# Patient Record
Sex: Female | Born: 1950 | Hispanic: No | Marital: Married | Smoking: Former smoker
Health system: Southern US, Community
[De-identification: ages and names within clinical notes are randomized; demographics above are authoritative.]

## PROBLEM LIST (undated history)

## (undated) DIAGNOSIS — R0602 Shortness of breath: Secondary | ICD-10-CM

## (undated) DIAGNOSIS — I10 Essential (primary) hypertension: Secondary | ICD-10-CM

## (undated) DIAGNOSIS — K219 Gastro-esophageal reflux disease without esophagitis: Secondary | ICD-10-CM

## (undated) DIAGNOSIS — R42 Dizziness and giddiness: Secondary | ICD-10-CM

## (undated) DIAGNOSIS — J449 Chronic obstructive pulmonary disease, unspecified: Secondary | ICD-10-CM

## (undated) DIAGNOSIS — C50919 Malignant neoplasm of unspecified site of unspecified female breast: Secondary | ICD-10-CM

## (undated) DIAGNOSIS — J189 Pneumonia, unspecified organism: Secondary | ICD-10-CM

## (undated) DIAGNOSIS — H5340 Unspecified visual field defects: Secondary | ICD-10-CM

## (undated) DIAGNOSIS — I2510 Atherosclerotic heart disease of native coronary artery without angina pectoris: Secondary | ICD-10-CM

## (undated) HISTORY — PX: TUBAL LIGATION: SHX77

## (undated) HISTORY — DX: Essential (primary) hypertension: I10

## (undated) HISTORY — DX: Chronic obstructive pulmonary disease, unspecified: J44.9

## (undated) HISTORY — DX: Malignant neoplasm of unspecified site of unspecified female breast: C50.919

## (undated) HISTORY — DX: Unspecified visual field defects: H53.40

---

## 1998-03-08 ENCOUNTER — Other Ambulatory Visit: Admission: RE | Admit: 1998-03-08 | Discharge: 1998-03-08 | Payer: Self-pay | Admitting: *Deleted

## 1998-05-08 ENCOUNTER — Encounter: Admission: RE | Admit: 1998-05-08 | Discharge: 1998-05-08 | Payer: Self-pay | Admitting: Family Medicine

## 1998-05-17 ENCOUNTER — Emergency Department (HOSPITAL_COMMUNITY): Admission: EM | Admit: 1998-05-17 | Discharge: 1998-05-17 | Payer: Self-pay | Admitting: *Deleted

## 1998-05-20 ENCOUNTER — Encounter: Admission: RE | Admit: 1998-05-20 | Discharge: 1998-05-20 | Payer: Self-pay | Admitting: Family Medicine

## 1998-05-28 ENCOUNTER — Encounter: Admission: RE | Admit: 1998-05-28 | Discharge: 1998-05-28 | Payer: Self-pay | Admitting: Sports Medicine

## 1998-05-31 ENCOUNTER — Encounter: Admission: RE | Admit: 1998-05-31 | Discharge: 1998-05-31 | Payer: Self-pay | Admitting: Family Medicine

## 1998-06-06 ENCOUNTER — Encounter: Admission: RE | Admit: 1998-06-06 | Discharge: 1998-06-06 | Payer: Self-pay | Admitting: Family Medicine

## 1998-06-13 ENCOUNTER — Encounter: Admission: RE | Admit: 1998-06-13 | Discharge: 1998-06-13 | Payer: Self-pay | Admitting: Family Medicine

## 1998-06-25 ENCOUNTER — Encounter: Admission: RE | Admit: 1998-06-25 | Discharge: 1998-06-25 | Payer: Self-pay | Admitting: Sports Medicine

## 1998-07-29 ENCOUNTER — Encounter: Admission: RE | Admit: 1998-07-29 | Discharge: 1998-07-29 | Payer: Self-pay | Admitting: Family Medicine

## 1998-08-12 ENCOUNTER — Encounter: Admission: RE | Admit: 1998-08-12 | Discharge: 1998-08-12 | Payer: Self-pay | Admitting: Family Medicine

## 1998-10-03 ENCOUNTER — Encounter: Admission: RE | Admit: 1998-10-03 | Discharge: 1998-10-03 | Payer: Self-pay | Admitting: Family Medicine

## 1998-10-08 ENCOUNTER — Encounter: Admission: RE | Admit: 1998-10-08 | Discharge: 1998-10-08 | Payer: Self-pay | Admitting: Sports Medicine

## 1998-10-17 ENCOUNTER — Encounter: Admission: RE | Admit: 1998-10-17 | Discharge: 1998-10-17 | Payer: Self-pay | Admitting: Family Medicine

## 1998-11-07 ENCOUNTER — Encounter: Admission: RE | Admit: 1998-11-07 | Discharge: 1998-11-07 | Payer: Self-pay | Admitting: Family Medicine

## 1998-11-13 ENCOUNTER — Ambulatory Visit (HOSPITAL_BASED_OUTPATIENT_CLINIC_OR_DEPARTMENT_OTHER): Admission: RE | Admit: 1998-11-13 | Discharge: 1998-11-13 | Payer: Self-pay | Admitting: Orthopedic Surgery

## 1998-11-18 ENCOUNTER — Encounter: Admission: RE | Admit: 1998-11-18 | Discharge: 1998-11-18 | Payer: Self-pay | Admitting: Sports Medicine

## 1998-12-19 ENCOUNTER — Emergency Department (HOSPITAL_COMMUNITY): Admission: EM | Admit: 1998-12-19 | Discharge: 1998-12-19 | Payer: Self-pay | Admitting: *Deleted

## 1998-12-24 ENCOUNTER — Encounter: Admission: RE | Admit: 1998-12-24 | Discharge: 1998-12-24 | Payer: Self-pay | Admitting: Family Medicine

## 1999-01-07 ENCOUNTER — Encounter: Admission: RE | Admit: 1999-01-07 | Discharge: 1999-01-07 | Payer: Self-pay | Admitting: Family Medicine

## 1999-01-13 ENCOUNTER — Encounter: Admission: RE | Admit: 1999-01-13 | Discharge: 1999-01-13 | Payer: Self-pay | Admitting: Family Medicine

## 1999-01-22 ENCOUNTER — Encounter: Admission: RE | Admit: 1999-01-22 | Discharge: 1999-01-22 | Payer: Self-pay | Admitting: Family Medicine

## 1999-01-29 ENCOUNTER — Encounter: Admission: RE | Admit: 1999-01-29 | Discharge: 1999-01-29 | Payer: Self-pay | Admitting: Family Medicine

## 1999-02-03 ENCOUNTER — Encounter: Admission: RE | Admit: 1999-02-03 | Discharge: 1999-02-03 | Payer: Self-pay | Admitting: Family Medicine

## 1999-02-13 ENCOUNTER — Encounter: Admission: RE | Admit: 1999-02-13 | Discharge: 1999-02-13 | Payer: Self-pay | Admitting: Family Medicine

## 1999-03-24 ENCOUNTER — Encounter: Admission: RE | Admit: 1999-03-24 | Discharge: 1999-03-24 | Payer: Self-pay | Admitting: Family Medicine

## 1999-04-10 ENCOUNTER — Other Ambulatory Visit: Admission: RE | Admit: 1999-04-10 | Discharge: 1999-04-10 | Payer: Self-pay | Admitting: *Deleted

## 1999-05-08 ENCOUNTER — Ambulatory Visit (HOSPITAL_COMMUNITY): Admission: RE | Admit: 1999-05-08 | Discharge: 1999-05-08 | Payer: Self-pay | Admitting: Family Medicine

## 1999-05-12 ENCOUNTER — Encounter: Admission: RE | Admit: 1999-05-12 | Discharge: 1999-05-12 | Payer: Self-pay | Admitting: Family Medicine

## 1999-06-12 ENCOUNTER — Encounter: Admission: RE | Admit: 1999-06-12 | Discharge: 1999-06-12 | Payer: Self-pay | Admitting: Family Medicine

## 1999-06-17 ENCOUNTER — Encounter: Admission: RE | Admit: 1999-06-17 | Discharge: 1999-06-17 | Payer: Self-pay | Admitting: *Deleted

## 1999-06-18 ENCOUNTER — Encounter: Admission: RE | Admit: 1999-06-18 | Discharge: 1999-06-18 | Payer: Self-pay | Admitting: Family Medicine

## 1999-07-21 ENCOUNTER — Encounter: Admission: RE | Admit: 1999-07-21 | Discharge: 1999-07-21 | Payer: Self-pay | Admitting: Family Medicine

## 1999-07-22 ENCOUNTER — Encounter: Admission: RE | Admit: 1999-07-22 | Discharge: 1999-07-22 | Payer: Self-pay | Admitting: *Deleted

## 1999-07-29 ENCOUNTER — Encounter: Admission: RE | Admit: 1999-07-29 | Discharge: 1999-07-29 | Payer: Self-pay | Admitting: Sports Medicine

## 1999-07-29 ENCOUNTER — Encounter: Payer: Self-pay | Admitting: Emergency Medicine

## 1999-07-29 ENCOUNTER — Inpatient Hospital Stay (HOSPITAL_COMMUNITY): Admission: EM | Admit: 1999-07-29 | Discharge: 1999-07-30 | Payer: Self-pay | Admitting: Emergency Medicine

## 1999-08-05 ENCOUNTER — Encounter: Admission: RE | Admit: 1999-08-05 | Discharge: 1999-08-05 | Payer: Self-pay | Admitting: Family Medicine

## 1999-08-11 ENCOUNTER — Ambulatory Visit (HOSPITAL_COMMUNITY): Admission: RE | Admit: 1999-08-11 | Discharge: 1999-08-11 | Payer: Self-pay | Admitting: *Deleted

## 1999-08-20 ENCOUNTER — Encounter: Admission: RE | Admit: 1999-08-20 | Discharge: 1999-08-20 | Payer: Self-pay | Admitting: Family Medicine

## 1999-09-23 ENCOUNTER — Other Ambulatory Visit: Admission: RE | Admit: 1999-09-23 | Discharge: 1999-09-23 | Payer: Self-pay | Admitting: Obstetrics & Gynecology

## 1999-09-23 ENCOUNTER — Encounter: Admission: RE | Admit: 1999-09-23 | Discharge: 1999-09-23 | Payer: Self-pay | Admitting: Obstetrics & Gynecology

## 1999-09-28 ENCOUNTER — Emergency Department (HOSPITAL_COMMUNITY): Admission: EM | Admit: 1999-09-28 | Discharge: 1999-09-28 | Payer: Self-pay | Admitting: Emergency Medicine

## 1999-09-29 ENCOUNTER — Encounter: Admission: RE | Admit: 1999-09-29 | Discharge: 1999-09-29 | Payer: Self-pay | Admitting: Family Medicine

## 1999-09-29 ENCOUNTER — Encounter: Payer: Self-pay | Admitting: Emergency Medicine

## 1999-10-07 ENCOUNTER — Encounter: Admission: RE | Admit: 1999-10-07 | Discharge: 1999-10-07 | Payer: Self-pay | Admitting: Sports Medicine

## 1999-10-09 ENCOUNTER — Encounter: Admission: RE | Admit: 1999-10-09 | Discharge: 1999-10-09 | Payer: Self-pay | Admitting: Family Medicine

## 1999-10-10 ENCOUNTER — Inpatient Hospital Stay (HOSPITAL_COMMUNITY): Admission: AD | Admit: 1999-10-10 | Discharge: 1999-10-10 | Payer: Self-pay | Admitting: Obstetrics

## 1999-10-10 ENCOUNTER — Encounter: Admission: RE | Admit: 1999-10-10 | Discharge: 1999-10-10 | Payer: Self-pay | Admitting: Obstetrics & Gynecology

## 1999-10-21 ENCOUNTER — Encounter: Admission: RE | Admit: 1999-10-21 | Discharge: 1999-10-21 | Payer: Self-pay | Admitting: Sports Medicine

## 1999-10-23 ENCOUNTER — Encounter: Payer: Self-pay | Admitting: Emergency Medicine

## 1999-10-23 ENCOUNTER — Inpatient Hospital Stay (HOSPITAL_COMMUNITY): Admission: EM | Admit: 1999-10-23 | Discharge: 1999-10-24 | Payer: Self-pay | Admitting: Emergency Medicine

## 1999-10-29 ENCOUNTER — Encounter: Admission: RE | Admit: 1999-10-29 | Discharge: 1999-10-29 | Payer: Self-pay | Admitting: Family Medicine

## 1999-12-19 ENCOUNTER — Encounter: Admission: RE | Admit: 1999-12-19 | Discharge: 1999-12-19 | Payer: Self-pay | Admitting: Family Medicine

## 2000-01-16 ENCOUNTER — Encounter: Admission: RE | Admit: 2000-01-16 | Discharge: 2000-01-16 | Payer: Self-pay | Admitting: Family Medicine

## 2000-01-26 ENCOUNTER — Encounter: Admission: RE | Admit: 2000-01-26 | Discharge: 2000-01-26 | Payer: Self-pay

## 2000-01-31 ENCOUNTER — Encounter: Payer: Self-pay | Admitting: Emergency Medicine

## 2000-01-31 ENCOUNTER — Emergency Department (HOSPITAL_COMMUNITY): Admission: EM | Admit: 2000-01-31 | Discharge: 2000-01-31 | Payer: Self-pay | Admitting: Emergency Medicine

## 2000-02-10 ENCOUNTER — Encounter: Admission: RE | Admit: 2000-02-10 | Discharge: 2000-02-10 | Payer: Self-pay | Admitting: Family Medicine

## 2000-02-24 ENCOUNTER — Encounter: Admission: RE | Admit: 2000-02-24 | Discharge: 2000-02-24 | Payer: Self-pay | Admitting: Family Medicine

## 2000-03-10 ENCOUNTER — Encounter: Admission: RE | Admit: 2000-03-10 | Discharge: 2000-03-10 | Payer: Self-pay | Admitting: Family Medicine

## 2000-04-01 ENCOUNTER — Encounter: Admission: RE | Admit: 2000-04-01 | Discharge: 2000-04-01 | Payer: Self-pay | Admitting: Family Medicine

## 2000-05-14 ENCOUNTER — Encounter: Admission: RE | Admit: 2000-05-14 | Discharge: 2000-05-14 | Payer: Self-pay | Admitting: Family Medicine

## 2000-05-31 ENCOUNTER — Encounter: Admission: RE | Admit: 2000-05-31 | Discharge: 2000-05-31 | Payer: Self-pay | Admitting: Family Medicine

## 2000-06-23 ENCOUNTER — Encounter: Admission: RE | Admit: 2000-06-23 | Discharge: 2000-06-23 | Payer: Self-pay | Admitting: Family Medicine

## 2000-10-19 ENCOUNTER — Encounter: Admission: RE | Admit: 2000-10-19 | Discharge: 2000-10-19 | Payer: Self-pay | Admitting: Sports Medicine

## 2000-10-26 ENCOUNTER — Encounter: Admission: RE | Admit: 2000-10-26 | Discharge: 2001-01-24 | Payer: Self-pay | Admitting: Family Medicine

## 2000-11-02 ENCOUNTER — Encounter: Payer: Self-pay | Admitting: Family Medicine

## 2000-11-02 ENCOUNTER — Encounter: Admission: RE | Admit: 2000-11-02 | Discharge: 2000-11-02 | Payer: Self-pay | Admitting: Family Medicine

## 2000-11-15 ENCOUNTER — Encounter: Admission: RE | Admit: 2000-11-15 | Discharge: 2000-11-15 | Payer: Self-pay | Admitting: Family Medicine

## 2000-12-03 ENCOUNTER — Encounter: Admission: RE | Admit: 2000-12-03 | Discharge: 2000-12-03 | Payer: Self-pay | Admitting: Family Medicine

## 2001-01-14 ENCOUNTER — Encounter: Admission: RE | Admit: 2001-01-14 | Discharge: 2001-01-14 | Payer: Self-pay | Admitting: Family Medicine

## 2001-01-17 ENCOUNTER — Encounter: Admission: RE | Admit: 2001-01-17 | Discharge: 2001-01-17 | Payer: Self-pay | Admitting: Family Medicine

## 2001-02-14 ENCOUNTER — Encounter: Admission: RE | Admit: 2001-02-14 | Discharge: 2001-02-14 | Payer: Self-pay | Admitting: Family Medicine

## 2001-02-15 ENCOUNTER — Encounter: Payer: Self-pay | Admitting: Family Medicine

## 2001-02-15 ENCOUNTER — Encounter: Admission: RE | Admit: 2001-02-15 | Discharge: 2001-02-15 | Payer: Self-pay | Admitting: Family Medicine

## 2001-03-17 ENCOUNTER — Encounter: Admission: RE | Admit: 2001-03-17 | Discharge: 2001-03-17 | Payer: Self-pay | Admitting: Family Medicine

## 2001-04-11 ENCOUNTER — Inpatient Hospital Stay (HOSPITAL_COMMUNITY): Admission: EM | Admit: 2001-04-11 | Discharge: 2001-04-12 | Payer: Self-pay | Admitting: Emergency Medicine

## 2001-04-11 ENCOUNTER — Encounter: Admission: RE | Admit: 2001-04-11 | Discharge: 2001-04-11 | Payer: Self-pay | Admitting: Family Medicine

## 2001-04-11 ENCOUNTER — Encounter: Payer: Self-pay | Admitting: Sports Medicine

## 2001-05-12 ENCOUNTER — Ambulatory Visit (HOSPITAL_COMMUNITY): Admission: RE | Admit: 2001-05-12 | Discharge: 2001-05-12 | Payer: Self-pay | Admitting: Family Medicine

## 2001-05-12 ENCOUNTER — Encounter: Admission: RE | Admit: 2001-05-12 | Discharge: 2001-05-12 | Payer: Self-pay | Admitting: Family Medicine

## 2001-06-10 ENCOUNTER — Encounter: Admission: RE | Admit: 2001-06-10 | Discharge: 2001-06-10 | Payer: Self-pay | Admitting: Family Medicine

## 2001-06-20 ENCOUNTER — Encounter: Admission: RE | Admit: 2001-06-20 | Discharge: 2001-06-20 | Payer: Self-pay | Admitting: Family Medicine

## 2001-06-30 ENCOUNTER — Encounter: Admission: RE | Admit: 2001-06-30 | Discharge: 2001-06-30 | Payer: Self-pay | Admitting: Family Medicine

## 2001-07-26 ENCOUNTER — Encounter: Admission: RE | Admit: 2001-07-26 | Discharge: 2001-07-26 | Payer: Self-pay | Admitting: Family Medicine

## 2001-08-22 ENCOUNTER — Encounter: Admission: RE | Admit: 2001-08-22 | Discharge: 2001-08-22 | Payer: Self-pay | Admitting: Family Medicine

## 2001-08-31 DIAGNOSIS — C50919 Malignant neoplasm of unspecified site of unspecified female breast: Secondary | ICD-10-CM

## 2001-08-31 HISTORY — DX: Malignant neoplasm of unspecified site of unspecified female breast: C50.919

## 2001-09-06 ENCOUNTER — Encounter: Admission: RE | Admit: 2001-09-06 | Discharge: 2001-09-06 | Payer: Self-pay | Admitting: Family Medicine

## 2001-09-09 ENCOUNTER — Encounter: Admission: RE | Admit: 2001-09-09 | Discharge: 2001-09-09 | Payer: Self-pay | Admitting: Family Medicine

## 2001-10-06 ENCOUNTER — Encounter: Admission: RE | Admit: 2001-10-06 | Discharge: 2001-10-06 | Payer: Self-pay | Admitting: Family Medicine

## 2001-10-18 ENCOUNTER — Encounter: Admission: RE | Admit: 2001-10-18 | Discharge: 2001-10-18 | Payer: Self-pay | Admitting: Family Medicine

## 2001-11-08 ENCOUNTER — Encounter: Admission: RE | Admit: 2001-11-08 | Discharge: 2001-11-08 | Payer: Self-pay | Admitting: Sports Medicine

## 2001-11-09 ENCOUNTER — Encounter: Admission: RE | Admit: 2001-11-09 | Discharge: 2001-11-09 | Payer: Self-pay | Admitting: Family Medicine

## 2001-11-09 ENCOUNTER — Encounter: Payer: Self-pay | Admitting: Family Medicine

## 2001-11-15 ENCOUNTER — Encounter: Admission: RE | Admit: 2001-11-15 | Discharge: 2001-11-15 | Payer: Self-pay | Admitting: Family Medicine

## 2001-11-15 ENCOUNTER — Encounter: Payer: Self-pay | Admitting: Family Medicine

## 2001-11-15 ENCOUNTER — Other Ambulatory Visit: Admission: RE | Admit: 2001-11-15 | Discharge: 2001-11-15 | Payer: Self-pay | Admitting: Family Medicine

## 2001-12-06 ENCOUNTER — Encounter: Admission: RE | Admit: 2001-12-06 | Discharge: 2001-12-06 | Payer: Self-pay | Admitting: Family Medicine

## 2001-12-09 ENCOUNTER — Encounter: Admission: RE | Admit: 2001-12-09 | Discharge: 2001-12-09 | Payer: Self-pay | Admitting: Family Medicine

## 2001-12-12 ENCOUNTER — Ambulatory Visit (HOSPITAL_BASED_OUTPATIENT_CLINIC_OR_DEPARTMENT_OTHER): Admission: RE | Admit: 2001-12-12 | Discharge: 2001-12-12 | Payer: Self-pay | Admitting: General Surgery

## 2001-12-12 ENCOUNTER — Encounter: Payer: Self-pay | Admitting: General Surgery

## 2001-12-12 ENCOUNTER — Encounter: Admission: RE | Admit: 2001-12-12 | Discharge: 2001-12-12 | Payer: Self-pay | Admitting: General Surgery

## 2001-12-12 ENCOUNTER — Encounter (INDEPENDENT_AMBULATORY_CARE_PROVIDER_SITE_OTHER): Payer: Self-pay | Admitting: *Deleted

## 2001-12-28 ENCOUNTER — Encounter: Admission: RE | Admit: 2001-12-28 | Discharge: 2001-12-28 | Payer: Self-pay | Admitting: Family Medicine

## 2001-12-30 ENCOUNTER — Encounter: Admission: RE | Admit: 2001-12-30 | Discharge: 2001-12-30 | Payer: Self-pay | Admitting: Family Medicine

## 2002-01-03 ENCOUNTER — Encounter: Admission: RE | Admit: 2002-01-03 | Discharge: 2002-01-03 | Payer: Self-pay | Admitting: Family Medicine

## 2002-01-06 ENCOUNTER — Encounter: Admission: RE | Admit: 2002-01-06 | Discharge: 2002-01-06 | Payer: Self-pay | Admitting: Family Medicine

## 2002-01-06 ENCOUNTER — Ambulatory Visit (HOSPITAL_BASED_OUTPATIENT_CLINIC_OR_DEPARTMENT_OTHER): Admission: RE | Admit: 2002-01-06 | Discharge: 2002-01-06 | Payer: Self-pay | Admitting: General Surgery

## 2002-01-20 ENCOUNTER — Ambulatory Visit (HOSPITAL_BASED_OUTPATIENT_CLINIC_OR_DEPARTMENT_OTHER): Admission: RE | Admit: 2002-01-20 | Discharge: 2002-01-21 | Payer: Self-pay | Admitting: General Surgery

## 2002-01-20 ENCOUNTER — Encounter (INDEPENDENT_AMBULATORY_CARE_PROVIDER_SITE_OTHER): Payer: Self-pay | Admitting: Specialist

## 2002-01-20 ENCOUNTER — Ambulatory Visit (HOSPITAL_COMMUNITY): Admission: RE | Admit: 2002-01-20 | Discharge: 2002-01-20 | Payer: Self-pay | Admitting: Internal Medicine

## 2002-01-30 ENCOUNTER — Ambulatory Visit: Admission: RE | Admit: 2002-01-30 | Discharge: 2002-04-30 | Payer: Self-pay | Admitting: Radiation Oncology

## 2002-02-15 ENCOUNTER — Encounter: Admission: RE | Admit: 2002-02-15 | Discharge: 2002-02-15 | Payer: Self-pay | Admitting: Family Medicine

## 2002-02-17 ENCOUNTER — Encounter: Payer: Self-pay | Admitting: Oncology

## 2002-02-17 ENCOUNTER — Ambulatory Visit (HOSPITAL_COMMUNITY): Admission: RE | Admit: 2002-02-17 | Discharge: 2002-02-17 | Payer: Self-pay | Admitting: Oncology

## 2002-02-21 ENCOUNTER — Ambulatory Visit (HOSPITAL_COMMUNITY): Admission: RE | Admit: 2002-02-21 | Discharge: 2002-02-21 | Payer: Self-pay | Admitting: Family Medicine

## 2002-03-06 ENCOUNTER — Encounter: Payer: Self-pay | Admitting: General Surgery

## 2002-03-06 ENCOUNTER — Ambulatory Visit (HOSPITAL_BASED_OUTPATIENT_CLINIC_OR_DEPARTMENT_OTHER): Admission: RE | Admit: 2002-03-06 | Discharge: 2002-03-06 | Payer: Self-pay | Admitting: General Surgery

## 2002-03-15 ENCOUNTER — Encounter: Admission: RE | Admit: 2002-03-15 | Discharge: 2002-03-15 | Payer: Self-pay | Admitting: Family Medicine

## 2002-03-24 ENCOUNTER — Ambulatory Visit (HOSPITAL_COMMUNITY): Admission: RE | Admit: 2002-03-24 | Discharge: 2002-03-24 | Payer: Self-pay | Admitting: Oncology

## 2002-03-24 ENCOUNTER — Encounter: Payer: Self-pay | Admitting: Oncology

## 2002-04-02 ENCOUNTER — Inpatient Hospital Stay (HOSPITAL_COMMUNITY): Admission: EM | Admit: 2002-04-02 | Discharge: 2002-04-04 | Payer: Self-pay

## 2002-04-13 ENCOUNTER — Encounter: Admission: RE | Admit: 2002-04-13 | Discharge: 2002-04-13 | Payer: Self-pay | Admitting: Family Medicine

## 2002-05-16 ENCOUNTER — Encounter: Admission: RE | Admit: 2002-05-16 | Discharge: 2002-05-16 | Payer: Self-pay | Admitting: Family Medicine

## 2002-06-13 ENCOUNTER — Ambulatory Visit: Admission: RE | Admit: 2002-06-13 | Discharge: 2002-09-11 | Payer: Self-pay | Admitting: Radiation Oncology

## 2002-07-03 ENCOUNTER — Encounter: Admission: RE | Admit: 2002-07-03 | Discharge: 2002-07-03 | Payer: Self-pay | Admitting: Family Medicine

## 2002-08-21 ENCOUNTER — Ambulatory Visit (HOSPITAL_BASED_OUTPATIENT_CLINIC_OR_DEPARTMENT_OTHER): Admission: RE | Admit: 2002-08-21 | Discharge: 2002-08-21 | Payer: Self-pay | Admitting: General Surgery

## 2002-09-12 ENCOUNTER — Ambulatory Visit: Admission: RE | Admit: 2002-09-12 | Discharge: 2002-10-17 | Payer: Self-pay | Admitting: Radiation Oncology

## 2002-11-03 ENCOUNTER — Ambulatory Visit (HOSPITAL_COMMUNITY): Admission: RE | Admit: 2002-11-03 | Discharge: 2002-11-03 | Payer: Self-pay | Admitting: Family Medicine

## 2002-11-03 ENCOUNTER — Encounter: Admission: RE | Admit: 2002-11-03 | Discharge: 2002-11-03 | Payer: Self-pay | Admitting: Family Medicine

## 2002-11-09 ENCOUNTER — Encounter: Admission: RE | Admit: 2002-11-09 | Discharge: 2002-11-09 | Payer: Self-pay | Admitting: Family Medicine

## 2002-11-17 ENCOUNTER — Encounter: Admission: RE | Admit: 2002-11-17 | Discharge: 2002-11-17 | Payer: Self-pay | Admitting: General Surgery

## 2002-11-17 ENCOUNTER — Encounter: Payer: Self-pay | Admitting: General Surgery

## 2002-12-22 ENCOUNTER — Encounter: Admission: RE | Admit: 2002-12-22 | Discharge: 2002-12-22 | Payer: Self-pay | Admitting: Family Medicine

## 2003-01-17 ENCOUNTER — Encounter: Payer: Self-pay | Admitting: Sports Medicine

## 2003-01-17 ENCOUNTER — Encounter: Admission: RE | Admit: 2003-01-17 | Discharge: 2003-01-17 | Payer: Self-pay | Admitting: Sports Medicine

## 2003-01-17 ENCOUNTER — Encounter: Admission: RE | Admit: 2003-01-17 | Discharge: 2003-01-17 | Payer: Self-pay | Admitting: Family Medicine

## 2003-01-25 ENCOUNTER — Encounter: Admission: RE | Admit: 2003-01-25 | Discharge: 2003-01-25 | Payer: Self-pay | Admitting: Family Medicine

## 2003-02-09 ENCOUNTER — Encounter: Payer: Self-pay | Admitting: Emergency Medicine

## 2003-02-09 ENCOUNTER — Inpatient Hospital Stay (HOSPITAL_COMMUNITY): Admission: EM | Admit: 2003-02-09 | Discharge: 2003-02-11 | Payer: Self-pay | Admitting: Emergency Medicine

## 2003-02-19 ENCOUNTER — Encounter: Admission: RE | Admit: 2003-02-19 | Discharge: 2003-02-19 | Payer: Self-pay | Admitting: Family Medicine

## 2003-03-26 ENCOUNTER — Encounter: Admission: RE | Admit: 2003-03-26 | Discharge: 2003-03-26 | Payer: Self-pay | Admitting: Family Medicine

## 2003-04-10 ENCOUNTER — Encounter: Admission: RE | Admit: 2003-04-10 | Discharge: 2003-04-10 | Payer: Self-pay | Admitting: Family Medicine

## 2003-04-23 ENCOUNTER — Emergency Department (HOSPITAL_COMMUNITY): Admission: EM | Admit: 2003-04-23 | Discharge: 2003-04-23 | Payer: Self-pay | Admitting: Emergency Medicine

## 2003-05-02 ENCOUNTER — Encounter: Admission: RE | Admit: 2003-05-02 | Discharge: 2003-05-02 | Payer: Self-pay | Admitting: Family Medicine

## 2003-06-19 ENCOUNTER — Encounter: Admission: RE | Admit: 2003-06-19 | Discharge: 2003-06-19 | Payer: Self-pay | Admitting: Family Medicine

## 2003-06-23 ENCOUNTER — Emergency Department (HOSPITAL_COMMUNITY): Admission: EM | Admit: 2003-06-23 | Discharge: 2003-06-23 | Payer: Self-pay | Admitting: Emergency Medicine

## 2003-06-23 ENCOUNTER — Encounter: Payer: Self-pay | Admitting: Emergency Medicine

## 2003-06-25 ENCOUNTER — Encounter: Payer: Self-pay | Admitting: Emergency Medicine

## 2003-06-25 ENCOUNTER — Inpatient Hospital Stay (HOSPITAL_COMMUNITY): Admission: EM | Admit: 2003-06-25 | Discharge: 2003-06-29 | Payer: Self-pay | Admitting: Nurse Practitioner

## 2003-06-25 ENCOUNTER — Encounter: Admission: RE | Admit: 2003-06-25 | Discharge: 2003-06-25 | Payer: Self-pay | Admitting: Family Medicine

## 2003-06-26 ENCOUNTER — Encounter: Payer: Self-pay | Admitting: Family Medicine

## 2003-07-04 ENCOUNTER — Encounter: Admission: RE | Admit: 2003-07-04 | Discharge: 2003-07-04 | Payer: Self-pay | Admitting: Family Medicine

## 2003-07-17 ENCOUNTER — Encounter: Admission: RE | Admit: 2003-07-17 | Discharge: 2003-07-17 | Payer: Self-pay | Admitting: Family Medicine

## 2003-07-25 ENCOUNTER — Encounter: Admission: RE | Admit: 2003-07-25 | Discharge: 2003-07-25 | Payer: Self-pay | Admitting: Family Medicine

## 2003-08-06 ENCOUNTER — Encounter: Admission: RE | Admit: 2003-08-06 | Discharge: 2003-08-06 | Payer: Self-pay | Admitting: Family Medicine

## 2003-08-22 ENCOUNTER — Encounter: Admission: RE | Admit: 2003-08-22 | Discharge: 2003-08-22 | Payer: Self-pay | Admitting: Sports Medicine

## 2003-09-11 ENCOUNTER — Encounter: Admission: RE | Admit: 2003-09-11 | Discharge: 2003-09-11 | Payer: Self-pay | Admitting: Family Medicine

## 2003-09-19 ENCOUNTER — Ambulatory Visit (HOSPITAL_COMMUNITY): Admission: RE | Admit: 2003-09-19 | Discharge: 2003-09-19 | Payer: Self-pay | Admitting: Family Medicine

## 2003-09-19 ENCOUNTER — Encounter: Admission: RE | Admit: 2003-09-19 | Discharge: 2003-09-19 | Payer: Self-pay | Admitting: Family Medicine

## 2003-10-10 ENCOUNTER — Encounter: Admission: RE | Admit: 2003-10-10 | Discharge: 2003-10-10 | Payer: Self-pay | Admitting: Sports Medicine

## 2003-10-25 ENCOUNTER — Emergency Department (HOSPITAL_COMMUNITY): Admission: EM | Admit: 2003-10-25 | Discharge: 2003-10-25 | Payer: Self-pay | Admitting: Emergency Medicine

## 2003-10-26 ENCOUNTER — Encounter: Admission: RE | Admit: 2003-10-26 | Discharge: 2003-10-26 | Payer: Self-pay | Admitting: Sports Medicine

## 2003-10-26 ENCOUNTER — Encounter: Admission: RE | Admit: 2003-10-26 | Discharge: 2003-10-26 | Payer: Self-pay | Admitting: Family Medicine

## 2003-11-06 ENCOUNTER — Encounter: Admission: RE | Admit: 2003-11-06 | Discharge: 2003-11-06 | Payer: Self-pay | Admitting: Family Medicine

## 2003-11-07 ENCOUNTER — Encounter: Admission: RE | Admit: 2003-11-07 | Discharge: 2003-11-07 | Payer: Self-pay | Admitting: Family Medicine

## 2003-11-13 ENCOUNTER — Emergency Department (HOSPITAL_COMMUNITY): Admission: EM | Admit: 2003-11-13 | Discharge: 2003-11-14 | Payer: Self-pay | Admitting: Emergency Medicine

## 2003-11-15 ENCOUNTER — Ambulatory Visit (HOSPITAL_COMMUNITY): Admission: RE | Admit: 2003-11-15 | Discharge: 2003-11-15 | Payer: Self-pay | Admitting: Cardiovascular Disease

## 2003-11-30 ENCOUNTER — Ambulatory Visit (HOSPITAL_COMMUNITY): Admission: RE | Admit: 2003-11-30 | Discharge: 2003-12-01 | Payer: Self-pay | Admitting: Cardiovascular Disease

## 2003-11-30 HISTORY — PX: CORONARY ANGIOPLASTY WITH STENT PLACEMENT: SHX49

## 2003-12-12 ENCOUNTER — Encounter: Admission: RE | Admit: 2003-12-12 | Discharge: 2003-12-12 | Payer: Self-pay | Admitting: Oncology

## 2003-12-13 ENCOUNTER — Encounter: Admission: RE | Admit: 2003-12-13 | Discharge: 2003-12-13 | Payer: Self-pay | Admitting: Family Medicine

## 2003-12-17 ENCOUNTER — Inpatient Hospital Stay (HOSPITAL_COMMUNITY): Admission: EM | Admit: 2003-12-17 | Discharge: 2003-12-24 | Payer: Self-pay | Admitting: Emergency Medicine

## 2004-01-02 ENCOUNTER — Encounter: Admission: RE | Admit: 2004-01-02 | Discharge: 2004-01-02 | Payer: Self-pay | Admitting: Family Medicine

## 2004-01-14 ENCOUNTER — Emergency Department (HOSPITAL_COMMUNITY): Admission: EM | Admit: 2004-01-14 | Discharge: 2004-01-14 | Payer: Self-pay | Admitting: Emergency Medicine

## 2004-01-17 ENCOUNTER — Encounter: Admission: RE | Admit: 2004-01-17 | Discharge: 2004-01-17 | Payer: Self-pay | Admitting: Sports Medicine

## 2004-03-06 ENCOUNTER — Ambulatory Visit (HOSPITAL_COMMUNITY): Admission: RE | Admit: 2004-03-06 | Discharge: 2004-03-06 | Payer: Self-pay | Admitting: Oncology

## 2004-03-11 ENCOUNTER — Encounter (INDEPENDENT_AMBULATORY_CARE_PROVIDER_SITE_OTHER): Payer: Self-pay | Admitting: Sports Medicine

## 2004-03-11 ENCOUNTER — Encounter: Admission: RE | Admit: 2004-03-11 | Discharge: 2004-03-11 | Payer: Self-pay | Admitting: Family Medicine

## 2004-03-15 ENCOUNTER — Emergency Department (HOSPITAL_COMMUNITY): Admission: EM | Admit: 2004-03-15 | Discharge: 2004-03-16 | Payer: Self-pay | Admitting: Emergency Medicine

## 2004-03-21 ENCOUNTER — Encounter: Admission: RE | Admit: 2004-03-21 | Discharge: 2004-03-21 | Payer: Self-pay | Admitting: Family Medicine

## 2004-04-01 ENCOUNTER — Encounter: Admission: RE | Admit: 2004-04-01 | Discharge: 2004-04-01 | Payer: Self-pay | Admitting: Family Medicine

## 2004-04-04 ENCOUNTER — Encounter: Admission: RE | Admit: 2004-04-04 | Discharge: 2004-04-04 | Payer: Self-pay | Admitting: Sports Medicine

## 2004-05-05 ENCOUNTER — Emergency Department (HOSPITAL_COMMUNITY): Admission: EM | Admit: 2004-05-05 | Discharge: 2004-05-05 | Payer: Self-pay | Admitting: Emergency Medicine

## 2004-05-06 ENCOUNTER — Ambulatory Visit: Payer: Self-pay | Admitting: Family Medicine

## 2004-05-07 ENCOUNTER — Ambulatory Visit: Payer: Self-pay | Admitting: Family Medicine

## 2004-05-27 ENCOUNTER — Ambulatory Visit: Payer: Self-pay | Admitting: Sports Medicine

## 2004-06-17 ENCOUNTER — Ambulatory Visit: Payer: Self-pay | Admitting: Sports Medicine

## 2004-07-08 ENCOUNTER — Ambulatory Visit: Payer: Self-pay | Admitting: Sports Medicine

## 2004-08-05 ENCOUNTER — Ambulatory Visit: Payer: Self-pay | Admitting: Sports Medicine

## 2004-08-19 ENCOUNTER — Ambulatory Visit: Payer: Self-pay | Admitting: Sports Medicine

## 2004-09-08 ENCOUNTER — Ambulatory Visit: Payer: Self-pay | Admitting: Internal Medicine

## 2004-09-09 ENCOUNTER — Ambulatory Visit (HOSPITAL_COMMUNITY): Admission: RE | Admit: 2004-09-09 | Discharge: 2004-09-09 | Payer: Self-pay | Admitting: Sports Medicine

## 2004-09-09 ENCOUNTER — Ambulatory Visit: Payer: Self-pay | Admitting: Family Medicine

## 2004-09-12 ENCOUNTER — Ambulatory Visit: Payer: Self-pay | Admitting: Oncology

## 2004-09-17 ENCOUNTER — Ambulatory Visit (HOSPITAL_COMMUNITY): Admission: RE | Admit: 2004-09-17 | Discharge: 2004-09-17 | Payer: Self-pay | Admitting: Internal Medicine

## 2004-09-17 ENCOUNTER — Ambulatory Visit: Payer: Self-pay | Admitting: Internal Medicine

## 2004-09-30 ENCOUNTER — Ambulatory Visit: Payer: Self-pay | Admitting: Family Medicine

## 2004-10-17 ENCOUNTER — Ambulatory Visit: Payer: Self-pay | Admitting: Family Medicine

## 2004-11-04 ENCOUNTER — Ambulatory Visit: Payer: Self-pay | Admitting: Sports Medicine

## 2004-11-18 ENCOUNTER — Other Ambulatory Visit: Admission: RE | Admit: 2004-11-18 | Discharge: 2004-11-18 | Payer: Self-pay | Admitting: Obstetrics and Gynecology

## 2004-11-18 ENCOUNTER — Ambulatory Visit: Payer: Self-pay | Admitting: Sports Medicine

## 2004-11-18 ENCOUNTER — Ambulatory Visit (HOSPITAL_COMMUNITY): Admission: RE | Admit: 2004-11-18 | Discharge: 2004-11-18 | Payer: Self-pay | Admitting: Sports Medicine

## 2004-11-22 ENCOUNTER — Emergency Department (HOSPITAL_COMMUNITY): Admission: EM | Admit: 2004-11-22 | Discharge: 2004-11-22 | Payer: Self-pay | Admitting: Emergency Medicine

## 2004-12-12 ENCOUNTER — Encounter: Admission: RE | Admit: 2004-12-12 | Discharge: 2004-12-12 | Payer: Self-pay | Admitting: General Surgery

## 2004-12-23 ENCOUNTER — Ambulatory Visit: Payer: Self-pay | Admitting: Sports Medicine

## 2004-12-31 ENCOUNTER — Ambulatory Visit: Payer: Self-pay | Admitting: Family Medicine

## 2005-01-19 ENCOUNTER — Other Ambulatory Visit: Admission: RE | Admit: 2005-01-19 | Discharge: 2005-01-19 | Payer: Self-pay | Admitting: Obstetrics and Gynecology

## 2005-02-28 ENCOUNTER — Encounter (INDEPENDENT_AMBULATORY_CARE_PROVIDER_SITE_OTHER): Payer: Self-pay | Admitting: *Deleted

## 2005-02-28 LAB — CONVERTED CEMR LAB

## 2005-03-12 ENCOUNTER — Ambulatory Visit: Payer: Self-pay | Admitting: Oncology

## 2005-05-15 ENCOUNTER — Ambulatory Visit (HOSPITAL_COMMUNITY): Admission: RE | Admit: 2005-05-15 | Discharge: 2005-05-15 | Payer: Self-pay | Admitting: Otolaryngology

## 2005-07-16 ENCOUNTER — Encounter: Admission: RE | Admit: 2005-07-16 | Discharge: 2005-07-16 | Payer: Self-pay | Admitting: Otolaryngology

## 2005-09-28 ENCOUNTER — Ambulatory Visit: Payer: Self-pay | Admitting: Oncology

## 2005-10-20 ENCOUNTER — Ambulatory Visit: Payer: Self-pay | Admitting: Sports Medicine

## 2005-11-02 ENCOUNTER — Ambulatory Visit: Payer: Self-pay | Admitting: Family Medicine

## 2005-11-13 ENCOUNTER — Ambulatory Visit: Payer: Self-pay | Admitting: Sports Medicine

## 2005-11-17 ENCOUNTER — Ambulatory Visit: Payer: Self-pay | Admitting: Sports Medicine

## 2005-12-14 ENCOUNTER — Encounter: Admission: RE | Admit: 2005-12-14 | Discharge: 2005-12-14 | Payer: Self-pay | Admitting: General Surgery

## 2005-12-15 ENCOUNTER — Encounter: Admission: RE | Admit: 2005-12-15 | Discharge: 2005-12-15 | Payer: Self-pay | Admitting: Sports Medicine

## 2005-12-15 ENCOUNTER — Ambulatory Visit: Payer: Self-pay | Admitting: Sports Medicine

## 2006-01-05 ENCOUNTER — Ambulatory Visit: Payer: Self-pay | Admitting: Family Medicine

## 2006-01-26 ENCOUNTER — Ambulatory Visit (HOSPITAL_COMMUNITY): Admission: RE | Admit: 2006-01-26 | Discharge: 2006-01-26 | Payer: Self-pay | Admitting: Family Medicine

## 2006-01-26 ENCOUNTER — Ambulatory Visit: Payer: Self-pay | Admitting: Sports Medicine

## 2006-02-19 ENCOUNTER — Ambulatory Visit: Payer: Self-pay | Admitting: Family Medicine

## 2006-02-23 ENCOUNTER — Ambulatory Visit: Payer: Self-pay | Admitting: Family Medicine

## 2006-03-08 ENCOUNTER — Ambulatory Visit: Payer: Self-pay | Admitting: Family Medicine

## 2006-03-23 ENCOUNTER — Ambulatory Visit: Payer: Self-pay | Admitting: Sports Medicine

## 2006-03-29 ENCOUNTER — Ambulatory Visit: Payer: Self-pay | Admitting: Oncology

## 2006-03-30 LAB — IRON AND TIBC
%SAT: 5 % — ABNORMAL LOW (ref 20–55)
Iron: 20 ug/dL — ABNORMAL LOW (ref 42–145)
TIBC: 424 ug/dL (ref 250–470)
UIBC: 404 ug/dL

## 2006-03-30 LAB — CBC WITH DIFFERENTIAL/PLATELET
BASO%: 0.5 % (ref 0.0–2.0)
Basophils Absolute: 0 10*3/uL (ref 0.0–0.1)
HCT: 32.8 % — ABNORMAL LOW (ref 34.8–46.6)
HGB: 10.6 g/dL — ABNORMAL LOW (ref 11.6–15.9)
LYMPH%: 26.6 % (ref 14.0–48.0)
MCH: 26.5 pg (ref 26.0–34.0)
MCHC: 32.5 g/dL (ref 32.0–36.0)
MONO#: 0.9 10*3/uL (ref 0.1–0.9)
NEUT%: 61.2 % (ref 39.6–76.8)
Platelets: 383 10*3/uL (ref 145–400)
WBC: 9.5 10*3/uL (ref 3.9–10.0)

## 2006-03-30 LAB — COMPREHENSIVE METABOLIC PANEL
ALT: 20 U/L (ref 0–40)
BUN: 12 mg/dL (ref 6–23)
CO2: 31 mEq/L (ref 19–32)
Calcium: 10.5 mg/dL (ref 8.4–10.5)
Chloride: 98 mEq/L (ref 96–112)
Creatinine, Ser: 0.86 mg/dL (ref 0.40–1.20)
Total Bilirubin: 0.3 mg/dL (ref 0.3–1.2)

## 2006-03-30 LAB — FERRITIN: Ferritin: 5 ng/mL — ABNORMAL LOW (ref 10–291)

## 2006-03-31 ENCOUNTER — Encounter: Admission: RE | Admit: 2006-03-31 | Discharge: 2006-03-31 | Payer: Self-pay | Admitting: Oncology

## 2006-04-05 ENCOUNTER — Encounter: Admission: RE | Admit: 2006-04-05 | Discharge: 2006-04-05 | Payer: Self-pay | Admitting: Sports Medicine

## 2006-04-05 ENCOUNTER — Ambulatory Visit (HOSPITAL_COMMUNITY): Admission: RE | Admit: 2006-04-05 | Discharge: 2006-04-05 | Payer: Self-pay | Admitting: Family Medicine

## 2006-04-05 ENCOUNTER — Ambulatory Visit: Payer: Self-pay | Admitting: Family Medicine

## 2006-04-07 LAB — FECAL OCCULT BLOOD, GUAIAC: Occult Blood: NEGATIVE

## 2006-04-12 ENCOUNTER — Encounter: Admission: RE | Admit: 2006-04-12 | Discharge: 2006-04-12 | Payer: Self-pay | Admitting: Oncology

## 2006-05-07 LAB — CBC WITH DIFFERENTIAL/PLATELET
EOS%: 2 % (ref 0.0–7.0)
HCT: 35.8 % (ref 34.8–46.6)
HGB: 11.8 g/dL (ref 11.6–15.9)
MONO#: 0.7 10*3/uL (ref 0.1–0.9)
MONO%: 7.7 % (ref 0.0–13.0)
NEUT#: 5.3 10*3/uL (ref 1.5–6.5)
Platelets: 323 10*3/uL (ref 145–400)
WBC: 8.5 10*3/uL (ref 3.9–10.0)
lymph#: 2.3 10*3/uL (ref 0.9–3.3)

## 2006-05-07 LAB — COMPREHENSIVE METABOLIC PANEL
AST: 24 U/L (ref 0–37)
Albumin: 4.6 g/dL (ref 3.5–5.2)
Alkaline Phosphatase: 77 U/L (ref 39–117)
Potassium: 4.7 mEq/L (ref 3.5–5.3)
Sodium: 141 mEq/L (ref 135–145)
Total Bilirubin: 0.2 mg/dL — ABNORMAL LOW (ref 0.3–1.2)
Total Protein: 7.3 g/dL (ref 6.0–8.3)

## 2006-05-20 ENCOUNTER — Encounter: Admission: RE | Admit: 2006-05-20 | Discharge: 2006-05-20 | Payer: Self-pay | Admitting: Sports Medicine

## 2006-05-20 ENCOUNTER — Ambulatory Visit: Payer: Self-pay | Admitting: Sports Medicine

## 2006-06-22 ENCOUNTER — Ambulatory Visit: Payer: Self-pay | Admitting: Sports Medicine

## 2006-07-02 ENCOUNTER — Inpatient Hospital Stay (HOSPITAL_COMMUNITY): Admission: RE | Admit: 2006-07-02 | Discharge: 2006-07-03 | Payer: Self-pay | Admitting: Obstetrics and Gynecology

## 2006-08-10 ENCOUNTER — Ambulatory Visit: Payer: Self-pay | Admitting: Sports Medicine

## 2006-08-17 ENCOUNTER — Ambulatory Visit: Payer: Self-pay | Admitting: Sports Medicine

## 2006-08-19 ENCOUNTER — Ambulatory Visit: Payer: Self-pay | Admitting: Family Medicine

## 2006-08-28 ENCOUNTER — Emergency Department (HOSPITAL_COMMUNITY): Admission: EM | Admit: 2006-08-28 | Discharge: 2006-08-28 | Payer: Self-pay | Admitting: Emergency Medicine

## 2006-09-27 ENCOUNTER — Ambulatory Visit: Payer: Self-pay | Admitting: Oncology

## 2006-10-06 ENCOUNTER — Ambulatory Visit: Payer: Self-pay | Admitting: Family Medicine

## 2006-10-18 LAB — COMPREHENSIVE METABOLIC PANEL
ALT: 12 U/L (ref 0–35)
AST: 11 U/L (ref 0–37)
Calcium: 9.6 mg/dL (ref 8.4–10.5)
Chloride: 98 mEq/L (ref 96–112)
Creatinine, Ser: 0.73 mg/dL (ref 0.40–1.20)
Potassium: 4.3 mEq/L (ref 3.5–5.3)
Sodium: 139 mEq/L (ref 135–145)
Total Protein: 6.8 g/dL (ref 6.0–8.3)

## 2006-10-18 LAB — CBC WITH DIFFERENTIAL/PLATELET
BASO%: 0.4 % (ref 0.0–2.0)
EOS%: 1.7 % (ref 0.0–7.0)
MCH: 28 pg (ref 26.0–34.0)
MCHC: 32.9 g/dL (ref 32.0–36.0)
MONO#: 0.6 10*3/uL (ref 0.1–0.9)
NEUT%: 68.1 % (ref 39.6–76.8)
RBC: 3.95 10*6/uL (ref 3.70–5.32)
RDW: 14.9 % — ABNORMAL HIGH (ref 11.3–14.5)
WBC: 8.9 10*3/uL (ref 3.9–10.0)
lymph#: 2 10*3/uL (ref 0.9–3.3)

## 2006-10-28 DIAGNOSIS — I5022 Chronic systolic (congestive) heart failure: Secondary | ICD-10-CM | POA: Insufficient documentation

## 2006-10-28 DIAGNOSIS — C50919 Malignant neoplasm of unspecified site of unspecified female breast: Secondary | ICD-10-CM | POA: Insufficient documentation

## 2006-10-28 DIAGNOSIS — J4489 Other specified chronic obstructive pulmonary disease: Secondary | ICD-10-CM | POA: Insufficient documentation

## 2006-10-28 DIAGNOSIS — K219 Gastro-esophageal reflux disease without esophagitis: Secondary | ICD-10-CM

## 2006-10-28 DIAGNOSIS — I10 Essential (primary) hypertension: Secondary | ICD-10-CM

## 2006-10-28 DIAGNOSIS — E7800 Pure hypercholesterolemia, unspecified: Secondary | ICD-10-CM

## 2006-10-28 DIAGNOSIS — E119 Type 2 diabetes mellitus without complications: Secondary | ICD-10-CM

## 2006-10-28 DIAGNOSIS — F319 Bipolar disorder, unspecified: Secondary | ICD-10-CM | POA: Insufficient documentation

## 2006-10-28 DIAGNOSIS — J449 Chronic obstructive pulmonary disease, unspecified: Secondary | ICD-10-CM

## 2006-10-28 DIAGNOSIS — F410 Panic disorder [episodic paroxysmal anxiety] without agoraphobia: Secondary | ICD-10-CM | POA: Insufficient documentation

## 2006-10-28 DIAGNOSIS — I2510 Atherosclerotic heart disease of native coronary artery without angina pectoris: Secondary | ICD-10-CM | POA: Insufficient documentation

## 2006-10-29 ENCOUNTER — Encounter (INDEPENDENT_AMBULATORY_CARE_PROVIDER_SITE_OTHER): Payer: Self-pay | Admitting: *Deleted

## 2006-11-30 ENCOUNTER — Encounter (INDEPENDENT_AMBULATORY_CARE_PROVIDER_SITE_OTHER): Payer: Self-pay | Admitting: Sports Medicine

## 2006-11-30 ENCOUNTER — Ambulatory Visit: Payer: Self-pay | Admitting: Family Medicine

## 2006-11-30 LAB — CONVERTED CEMR LAB
AST: 17 units/L (ref 0–37)
Albumin: 4.3 g/dL (ref 3.5–5.2)
Alkaline Phosphatase: 80 units/L (ref 39–117)
CO2: 26 meq/L (ref 19–32)
Calcium: 10.2 mg/dL (ref 8.4–10.5)
Chloride: 97 meq/L (ref 96–112)
Glucose, Bld: 88 mg/dL (ref 70–99)
Hgb A1c MFr Bld: 7.8 %
Sodium: 138 meq/L (ref 135–145)
Total Bilirubin: 0.2 mg/dL — ABNORMAL LOW (ref 0.3–1.2)
Total Protein: 7.4 g/dL (ref 6.0–8.3)

## 2006-12-01 ENCOUNTER — Telehealth: Payer: Self-pay | Admitting: *Deleted

## 2006-12-03 ENCOUNTER — Telehealth (INDEPENDENT_AMBULATORY_CARE_PROVIDER_SITE_OTHER): Payer: Self-pay | Admitting: Sports Medicine

## 2006-12-03 ENCOUNTER — Encounter (INDEPENDENT_AMBULATORY_CARE_PROVIDER_SITE_OTHER): Payer: Self-pay | Admitting: Sports Medicine

## 2006-12-06 ENCOUNTER — Telehealth: Payer: Self-pay | Admitting: *Deleted

## 2006-12-13 ENCOUNTER — Telehealth: Payer: Self-pay | Admitting: *Deleted

## 2006-12-21 ENCOUNTER — Ambulatory Visit: Payer: Self-pay | Admitting: Sports Medicine

## 2006-12-21 LAB — CONVERTED CEMR LAB
Hemoglobin: 9.7 g/dL
TIBC: 423 ug/dL (ref 250–470)
UIBC: 402 ug/dL

## 2007-01-03 ENCOUNTER — Telehealth (INDEPENDENT_AMBULATORY_CARE_PROVIDER_SITE_OTHER): Payer: Self-pay | Admitting: *Deleted

## 2007-01-04 ENCOUNTER — Telehealth (INDEPENDENT_AMBULATORY_CARE_PROVIDER_SITE_OTHER): Payer: Self-pay | Admitting: Sports Medicine

## 2007-01-05 ENCOUNTER — Ambulatory Visit: Payer: Self-pay | Admitting: Internal Medicine

## 2007-01-05 LAB — CONVERTED CEMR LAB
Basophils Relative: 0.7 % (ref 0.0–1.0)
Eosinophils Relative: 2.1 % (ref 0.0–5.0)
HCT: 34.2 % — ABNORMAL LOW (ref 36.0–46.0)
Hemoglobin: 11 g/dL — ABNORMAL LOW (ref 12.0–15.0)
Lymphocytes Relative: 32.6 % (ref 12.0–46.0)
Monocytes Absolute: 0.9 10*3/uL — ABNORMAL HIGH (ref 0.2–0.7)
Neutro Abs: 5.1 10*3/uL (ref 1.4–7.7)
Neutrophils Relative %: 54.6 % (ref 43.0–77.0)
RDW: 19.7 % — ABNORMAL HIGH (ref 11.5–14.6)
WBC: 9.4 10*3/uL (ref 4.5–10.5)

## 2007-01-10 ENCOUNTER — Encounter: Payer: Self-pay | Admitting: Family Medicine

## 2007-01-10 ENCOUNTER — Ambulatory Visit: Payer: Self-pay | Admitting: Sports Medicine

## 2007-01-10 ENCOUNTER — Telehealth: Payer: Self-pay | Admitting: *Deleted

## 2007-01-10 LAB — CONVERTED CEMR LAB
BUN: 18 mg/dL (ref 6–23)
HCT: 33 %
Platelets: 381 10*3/uL
Potassium: 3.9 meq/L (ref 3.5–5.3)
RBC: 4.27 M/uL
Sodium: 137 meq/L (ref 135–145)
WBC: 10.3 10*3/uL

## 2007-01-25 ENCOUNTER — Telehealth: Payer: Self-pay | Admitting: *Deleted

## 2007-01-26 ENCOUNTER — Encounter: Payer: Self-pay | Admitting: Internal Medicine

## 2007-01-26 ENCOUNTER — Ambulatory Visit: Payer: Self-pay | Admitting: Internal Medicine

## 2007-02-01 ENCOUNTER — Ambulatory Visit: Payer: Self-pay | Admitting: Sports Medicine

## 2007-02-01 LAB — CONVERTED CEMR LAB
Bilirubin Urine: NEGATIVE
Folate: >20 ng/mL
Glucose, Urine, Semiquant: NEGATIVE
Ketones, urine, test strip: NEGATIVE
Protein, U semiquant: NEGATIVE
Vitamin B-12: 731 pg/mL (ref 211–911)
pH: 6.5

## 2007-02-03 ENCOUNTER — Telehealth (INDEPENDENT_AMBULATORY_CARE_PROVIDER_SITE_OTHER): Payer: Self-pay | Admitting: Sports Medicine

## 2007-02-04 ENCOUNTER — Telehealth (INDEPENDENT_AMBULATORY_CARE_PROVIDER_SITE_OTHER): Payer: Self-pay | Admitting: Sports Medicine

## 2007-02-08 ENCOUNTER — Encounter (INDEPENDENT_AMBULATORY_CARE_PROVIDER_SITE_OTHER): Payer: Self-pay | Admitting: *Deleted

## 2007-02-08 ENCOUNTER — Encounter (INDEPENDENT_AMBULATORY_CARE_PROVIDER_SITE_OTHER): Payer: Self-pay | Admitting: Sports Medicine

## 2007-02-09 ENCOUNTER — Telehealth: Payer: Self-pay | Admitting: *Deleted

## 2007-02-14 ENCOUNTER — Encounter (INDEPENDENT_AMBULATORY_CARE_PROVIDER_SITE_OTHER): Payer: Self-pay | Admitting: Sports Medicine

## 2007-02-15 ENCOUNTER — Encounter: Payer: Self-pay | Admitting: *Deleted

## 2007-02-15 ENCOUNTER — Emergency Department (HOSPITAL_COMMUNITY): Admission: EM | Admit: 2007-02-15 | Discharge: 2007-02-15 | Payer: Self-pay | Admitting: Emergency Medicine

## 2007-02-15 ENCOUNTER — Telehealth: Payer: Self-pay | Admitting: *Deleted

## 2007-02-22 ENCOUNTER — Telehealth: Payer: Self-pay | Admitting: Family Medicine

## 2007-02-23 ENCOUNTER — Encounter: Payer: Self-pay | Admitting: Family Medicine

## 2007-03-02 ENCOUNTER — Ambulatory Visit: Payer: Self-pay | Admitting: Internal Medicine

## 2007-03-08 ENCOUNTER — Telehealth: Payer: Self-pay | Admitting: *Deleted

## 2007-03-17 ENCOUNTER — Telehealth (INDEPENDENT_AMBULATORY_CARE_PROVIDER_SITE_OTHER): Payer: Self-pay | Admitting: *Deleted

## 2007-03-17 ENCOUNTER — Encounter: Admission: RE | Admit: 2007-03-17 | Discharge: 2007-03-17 | Payer: Self-pay | Admitting: Oncology

## 2007-04-01 ENCOUNTER — Ambulatory Visit: Payer: Self-pay | Admitting: Family Medicine

## 2007-04-01 ENCOUNTER — Telehealth: Payer: Self-pay | Admitting: Family Medicine

## 2007-04-01 LAB — CONVERTED CEMR LAB
Alkaline Phosphatase: 77 units/L (ref 39–117)
BUN: 11 mg/dL (ref 6–23)
Glucose, Bld: 175 mg/dL — ABNORMAL HIGH (ref 70–99)
Sodium: 140 meq/L (ref 135–145)
Total Bilirubin: 0.2 mg/dL — ABNORMAL LOW (ref 0.3–1.2)

## 2007-04-04 ENCOUNTER — Encounter: Payer: Self-pay | Admitting: Family Medicine

## 2007-04-11 ENCOUNTER — Encounter: Payer: Self-pay | Admitting: Family Medicine

## 2007-04-21 ENCOUNTER — Encounter (INDEPENDENT_AMBULATORY_CARE_PROVIDER_SITE_OTHER): Payer: Self-pay | Admitting: Family Medicine

## 2007-04-21 ENCOUNTER — Telehealth (INDEPENDENT_AMBULATORY_CARE_PROVIDER_SITE_OTHER): Payer: Self-pay | Admitting: *Deleted

## 2007-04-21 ENCOUNTER — Ambulatory Visit: Payer: Self-pay | Admitting: Family Medicine

## 2007-04-21 LAB — CONVERTED CEMR LAB
HCT: 40.4 %
Hemoglobin: 11.9 g/dL — ABNORMAL LOW
INR: 0.9
MCHC: 29.5 g/dL — ABNORMAL LOW
MCV: 87.8 fL
Platelets: 291 10*3/uL
Prothrombin Time: 12.7 s
RBC: 4.6 M/uL
RDW: 16.8 % — ABNORMAL HIGH
WBC: 6.8 10*3/uL
aPTT: 33 s

## 2007-04-22 ENCOUNTER — Encounter (INDEPENDENT_AMBULATORY_CARE_PROVIDER_SITE_OTHER): Payer: Self-pay | Admitting: Family Medicine

## 2007-04-28 ENCOUNTER — Telehealth: Payer: Self-pay | Admitting: *Deleted

## 2007-04-28 ENCOUNTER — Ambulatory Visit: Payer: Self-pay | Admitting: Family Medicine

## 2007-05-05 ENCOUNTER — Encounter: Payer: Self-pay | Admitting: Family Medicine

## 2007-05-30 ENCOUNTER — Ambulatory Visit: Payer: Self-pay | Admitting: Family Medicine

## 2007-06-06 ENCOUNTER — Telehealth: Payer: Self-pay | Admitting: Family Medicine

## 2007-06-08 ENCOUNTER — Inpatient Hospital Stay (HOSPITAL_COMMUNITY): Admission: AD | Admit: 2007-06-08 | Discharge: 2007-06-09 | Payer: Self-pay | Admitting: Family Medicine

## 2007-06-08 ENCOUNTER — Ambulatory Visit: Payer: Self-pay | Admitting: Internal Medicine

## 2007-06-08 ENCOUNTER — Ambulatory Visit: Payer: Self-pay | Admitting: Family Medicine

## 2007-06-10 ENCOUNTER — Encounter: Payer: Self-pay | Admitting: Family Medicine

## 2007-06-15 ENCOUNTER — Encounter: Payer: Self-pay | Admitting: *Deleted

## 2007-06-22 ENCOUNTER — Ambulatory Visit: Payer: Self-pay | Admitting: Family Medicine

## 2007-06-29 ENCOUNTER — Telehealth: Payer: Self-pay | Admitting: *Deleted

## 2007-07-07 ENCOUNTER — Telehealth: Payer: Self-pay | Admitting: *Deleted

## 2007-07-11 ENCOUNTER — Telehealth: Payer: Self-pay | Admitting: *Deleted

## 2007-08-08 ENCOUNTER — Ambulatory Visit: Payer: Self-pay | Admitting: Oncology

## 2007-08-12 ENCOUNTER — Ambulatory Visit: Payer: Self-pay | Admitting: Family Medicine

## 2007-08-12 LAB — COMPREHENSIVE METABOLIC PANEL
AST: 15 U/L (ref 0–37)
Alkaline Phosphatase: 71 U/L (ref 39–117)
BUN: 13 mg/dL (ref 6–23)
Creatinine, Ser: 0.77 mg/dL (ref 0.40–1.20)
Potassium: 4.1 mEq/L (ref 3.5–5.3)

## 2007-08-12 LAB — CBC WITH DIFFERENTIAL/PLATELET
Basophils Absolute: 0 10*3/uL (ref 0.0–0.1)
EOS%: 2.3 % (ref 0.0–7.0)
HGB: 12.4 g/dL (ref 11.6–15.9)
MCH: 29.5 pg (ref 26.0–34.0)
MCHC: 33.7 g/dL (ref 32.0–36.0)
MCV: 87.4 fL (ref 81.0–101.0)
MONO%: 10.4 % (ref 0.0–13.0)
NEUT%: 50 % (ref 39.6–76.8)
RDW: 13.9 % (ref 11.3–14.5)

## 2007-08-12 LAB — FERRITIN: Ferritin: 10 ng/mL (ref 10–291)

## 2007-08-16 ENCOUNTER — Telehealth (INDEPENDENT_AMBULATORY_CARE_PROVIDER_SITE_OTHER): Payer: Self-pay | Admitting: *Deleted

## 2007-08-17 ENCOUNTER — Encounter: Payer: Self-pay | Admitting: Family Medicine

## 2007-08-18 ENCOUNTER — Telehealth: Payer: Self-pay | Admitting: *Deleted

## 2007-08-25 ENCOUNTER — Observation Stay (HOSPITAL_COMMUNITY): Admission: EM | Admit: 2007-08-25 | Discharge: 2007-08-26 | Payer: Self-pay | Admitting: Emergency Medicine

## 2007-09-08 ENCOUNTER — Telehealth: Payer: Self-pay | Admitting: *Deleted

## 2007-09-09 ENCOUNTER — Encounter: Payer: Self-pay | Admitting: Family Medicine

## 2007-09-09 ENCOUNTER — Ambulatory Visit: Payer: Self-pay | Admitting: Family Medicine

## 2007-09-12 ENCOUNTER — Telehealth: Payer: Self-pay | Admitting: *Deleted

## 2007-09-12 LAB — CONVERTED CEMR LAB
BUN: 8 mg/dL (ref 6–23)
Calcium: 9.6 mg/dL (ref 8.4–10.5)
Eosinophils Absolute: 0.1 10*3/uL (ref 0.0–0.7)
Eosinophils Relative: 1 % (ref 0–5)
Glucose, Bld: 85 mg/dL (ref 70–99)
HCT: 38.2 % (ref 36.0–46.0)
Hemoglobin: 12 g/dL (ref 12.0–15.0)
Lymphs Abs: 2.8 10*3/uL (ref 0.7–4.0)
MCV: 91.8 fL (ref 78.0–100.0)
Monocytes Absolute: 1.3 10*3/uL — ABNORMAL HIGH (ref 0.1–1.0)
Monocytes Relative: 11 % (ref 3–12)
Potassium: 3.8 meq/L (ref 3.5–5.3)
RBC: 4.16 M/uL (ref 3.87–5.11)
TSH: 1.736 microintl units/mL (ref 0.350–5.50)
WBC: 12 10*3/uL — ABNORMAL HIGH (ref 4.0–10.5)

## 2007-09-19 ENCOUNTER — Telehealth: Payer: Self-pay | Admitting: *Deleted

## 2007-09-28 ENCOUNTER — Telehealth: Payer: Self-pay | Admitting: *Deleted

## 2007-10-10 ENCOUNTER — Telehealth: Payer: Self-pay | Admitting: *Deleted

## 2007-10-11 ENCOUNTER — Ambulatory Visit: Payer: Self-pay | Admitting: Family Medicine

## 2007-10-19 ENCOUNTER — Telehealth: Payer: Self-pay | Admitting: *Deleted

## 2007-10-20 ENCOUNTER — Ambulatory Visit: Payer: Self-pay | Admitting: Family Medicine

## 2007-11-16 ENCOUNTER — Ambulatory Visit: Payer: Self-pay | Admitting: Family Medicine

## 2007-11-16 LAB — CONVERTED CEMR LAB
ALT: 12 units/L (ref 0–35)
AST: 15 units/L (ref 0–37)
Albumin: 4.4 g/dL (ref 3.5–5.2)
BUN: 11 mg/dL (ref 6–23)
Calcium: 10.1 mg/dL (ref 8.4–10.5)
Chloride: 98 meq/L (ref 96–112)
Potassium: 4.5 meq/L (ref 3.5–5.3)
Sodium: 139 meq/L (ref 135–145)
Total Protein: 7.3 g/dL (ref 6.0–8.3)

## 2007-11-21 ENCOUNTER — Encounter: Payer: Self-pay | Admitting: Family Medicine

## 2007-12-03 ENCOUNTER — Telehealth (INDEPENDENT_AMBULATORY_CARE_PROVIDER_SITE_OTHER): Payer: Self-pay | Admitting: Family Medicine

## 2007-12-26 ENCOUNTER — Telehealth: Payer: Self-pay | Admitting: *Deleted

## 2008-01-06 ENCOUNTER — Telehealth (INDEPENDENT_AMBULATORY_CARE_PROVIDER_SITE_OTHER): Payer: Self-pay | Admitting: *Deleted

## 2008-01-08 ENCOUNTER — Emergency Department (HOSPITAL_COMMUNITY): Admission: EM | Admit: 2008-01-08 | Discharge: 2008-01-09 | Payer: Self-pay | Admitting: Emergency Medicine

## 2008-01-18 ENCOUNTER — Encounter: Payer: Self-pay | Admitting: *Deleted

## 2008-02-18 ENCOUNTER — Encounter: Payer: Self-pay | Admitting: Family Medicine

## 2008-02-23 ENCOUNTER — Telehealth: Payer: Self-pay | Admitting: *Deleted

## 2008-02-29 ENCOUNTER — Ambulatory Visit: Payer: Self-pay | Admitting: Family Medicine

## 2008-03-12 ENCOUNTER — Encounter: Payer: Self-pay | Admitting: Family Medicine

## 2008-03-19 ENCOUNTER — Encounter: Admission: RE | Admit: 2008-03-19 | Discharge: 2008-03-19 | Payer: Self-pay | Admitting: Oncology

## 2008-03-19 ENCOUNTER — Telehealth: Payer: Self-pay | Admitting: *Deleted

## 2008-03-27 ENCOUNTER — Telehealth: Payer: Self-pay | Admitting: Family Medicine

## 2008-04-10 ENCOUNTER — Ambulatory Visit: Payer: Self-pay | Admitting: Family Medicine

## 2008-04-10 LAB — CONVERTED CEMR LAB
Nitrite: NEGATIVE
Protein, U semiquant: NEGATIVE
Urobilinogen, UA: 0.2

## 2008-04-12 ENCOUNTER — Telehealth: Payer: Self-pay | Admitting: *Deleted

## 2008-04-13 ENCOUNTER — Encounter: Admission: RE | Admit: 2008-04-13 | Discharge: 2008-04-13 | Payer: Self-pay | Admitting: Oncology

## 2008-04-16 ENCOUNTER — Telehealth: Payer: Self-pay | Admitting: *Deleted

## 2008-04-27 ENCOUNTER — Encounter: Payer: Self-pay | Admitting: Family Medicine

## 2008-05-02 ENCOUNTER — Ambulatory Visit: Payer: Self-pay | Admitting: Oncology

## 2008-05-08 LAB — COMPREHENSIVE METABOLIC PANEL
AST: 16 U/L (ref 0–37)
Alkaline Phosphatase: 77 U/L (ref 39–117)
BUN: 13 mg/dL (ref 6–23)
Glucose, Bld: 154 mg/dL — ABNORMAL HIGH (ref 70–99)
Potassium: 4.4 mEq/L (ref 3.5–5.3)
Total Bilirubin: 0.3 mg/dL (ref 0.3–1.2)

## 2008-05-08 LAB — CBC WITH DIFFERENTIAL/PLATELET
Basophils Absolute: 0 10*3/uL (ref 0.0–0.1)
EOS%: 3.4 % (ref 0.0–7.0)
Eosinophils Absolute: 0.2 10*3/uL (ref 0.0–0.5)
LYMPH%: 31 % (ref 14.0–48.0)
MCH: 30.1 pg (ref 26.0–34.0)
MCV: 90.2 fL (ref 81.0–101.0)
MONO%: 9.5 % (ref 0.0–13.0)
NEUT#: 3.7 10*3/uL (ref 1.5–6.5)
Platelets: 275 10*3/uL (ref 145–400)
RBC: 4.57 10*6/uL (ref 3.70–5.32)
RDW: 14.3 % (ref 11.3–14.5)

## 2008-05-09 ENCOUNTER — Telehealth: Payer: Self-pay | Admitting: *Deleted

## 2008-05-10 ENCOUNTER — Ambulatory Visit: Payer: Self-pay | Admitting: Family Medicine

## 2008-05-15 ENCOUNTER — Encounter: Payer: Self-pay | Admitting: Family Medicine

## 2008-05-21 ENCOUNTER — Encounter: Admission: RE | Admit: 2008-05-21 | Discharge: 2008-06-13 | Payer: Self-pay | Admitting: Oncology

## 2008-05-30 ENCOUNTER — Ambulatory Visit: Payer: Self-pay | Admitting: Family Medicine

## 2008-07-03 ENCOUNTER — Ambulatory Visit: Payer: Self-pay | Admitting: Family Medicine

## 2008-07-04 LAB — CONVERTED CEMR LAB

## 2008-07-05 ENCOUNTER — Telehealth (INDEPENDENT_AMBULATORY_CARE_PROVIDER_SITE_OTHER): Payer: Self-pay | Admitting: *Deleted

## 2008-07-06 ENCOUNTER — Encounter: Payer: Self-pay | Admitting: Family Medicine

## 2008-07-16 ENCOUNTER — Ambulatory Visit: Payer: Self-pay | Admitting: Family Medicine

## 2008-07-16 LAB — CONVERTED CEMR LAB
Glucose, Urine, Semiquant: NEGATIVE
Nitrite: NEGATIVE
Specific Gravity, Urine: 1.01
WBC Urine, dipstick: NEGATIVE
pH: 7

## 2008-08-01 ENCOUNTER — Ambulatory Visit: Payer: Self-pay | Admitting: Family Medicine

## 2008-08-01 LAB — CONVERTED CEMR LAB
ALT: 12 units/L (ref 0–35)
AST: 14 units/L (ref 0–37)
BUN: 13 mg/dL (ref 6–23)
CO2: 28 meq/L (ref 19–32)
Calcium: 10.6 mg/dL — ABNORMAL HIGH (ref 8.4–10.5)
Chloride: 100 meq/L (ref 96–112)
Cholesterol: 162 mg/dL (ref 0–200)
Creatinine, Ser: 0.68 mg/dL (ref 0.40–1.20)
HDL: 56 mg/dL (ref >39)
Total Bilirubin: 0.3 mg/dL (ref 0.3–1.2)
Total CHOL/HDL Ratio: 2.9
VLDL: 21 mg/dL (ref 0–40)

## 2008-08-06 ENCOUNTER — Encounter: Payer: Self-pay | Admitting: Family Medicine

## 2008-08-06 ENCOUNTER — Telehealth: Payer: Self-pay | Admitting: *Deleted

## 2008-08-15 ENCOUNTER — Telehealth: Payer: Self-pay | Admitting: *Deleted

## 2008-08-28 ENCOUNTER — Encounter: Payer: Self-pay | Admitting: Family Medicine

## 2008-08-29 ENCOUNTER — Telehealth (INDEPENDENT_AMBULATORY_CARE_PROVIDER_SITE_OTHER): Payer: Self-pay | Admitting: *Deleted

## 2008-09-10 ENCOUNTER — Ambulatory Visit: Payer: Self-pay | Admitting: Family Medicine

## 2008-09-10 ENCOUNTER — Telehealth: Payer: Self-pay | Admitting: *Deleted

## 2008-09-13 ENCOUNTER — Ambulatory Visit: Payer: Self-pay | Admitting: Family Medicine

## 2008-09-13 ENCOUNTER — Encounter: Payer: Self-pay | Admitting: Family Medicine

## 2008-09-13 ENCOUNTER — Telehealth: Payer: Self-pay | Admitting: Family Medicine

## 2008-09-14 ENCOUNTER — Encounter: Payer: Self-pay | Admitting: Family Medicine

## 2008-09-18 ENCOUNTER — Telehealth (INDEPENDENT_AMBULATORY_CARE_PROVIDER_SITE_OTHER): Payer: Self-pay | Admitting: *Deleted

## 2008-09-18 ENCOUNTER — Ambulatory Visit: Payer: Self-pay | Admitting: Family Medicine

## 2008-10-18 ENCOUNTER — Telehealth: Payer: Self-pay | Admitting: Family Medicine

## 2008-10-27 ENCOUNTER — Emergency Department (HOSPITAL_COMMUNITY): Admission: EM | Admit: 2008-10-27 | Discharge: 2008-10-27 | Payer: Self-pay | Admitting: Family Medicine

## 2008-10-31 ENCOUNTER — Ambulatory Visit: Payer: Self-pay | Admitting: Family Medicine

## 2008-10-31 ENCOUNTER — Telehealth: Payer: Self-pay | Admitting: *Deleted

## 2008-10-31 ENCOUNTER — Telehealth: Payer: Self-pay | Admitting: Family Medicine

## 2008-11-01 ENCOUNTER — Ambulatory Visit: Payer: Self-pay | Admitting: Oncology

## 2008-11-02 ENCOUNTER — Ambulatory Visit: Payer: Self-pay | Admitting: Family Medicine

## 2008-11-02 ENCOUNTER — Encounter: Admission: RE | Admit: 2008-11-02 | Discharge: 2008-11-02 | Payer: Self-pay | Admitting: Family Medicine

## 2008-11-12 ENCOUNTER — Encounter: Payer: Self-pay | Admitting: Internal Medicine

## 2008-11-12 LAB — CBC WITH DIFFERENTIAL/PLATELET
BASO%: 0.5 % (ref 0.0–2.0)
EOS%: 2.7 % (ref 0.0–7.0)
HCT: 39.2 % (ref 34.8–46.6)
MCH: 30.5 pg (ref 25.1–34.0)
MCHC: 33.7 g/dL (ref 31.5–36.0)
MONO#: 0.9 10*3/uL (ref 0.1–0.9)
RBC: 4.33 10*6/uL (ref 3.70–5.45)
RDW: 13.5 % (ref 11.2–14.5)
WBC: 10.4 10*3/uL — ABNORMAL HIGH (ref 3.9–10.3)
lymph#: 2.3 10*3/uL (ref 0.9–3.3)

## 2008-11-13 LAB — LACTATE DEHYDROGENASE: LDH: 150 U/L (ref 94–250)

## 2008-11-13 LAB — COMPREHENSIVE METABOLIC PANEL
ALT: 13 U/L (ref 0–35)
AST: 12 U/L (ref 0–37)
CO2: 28 mEq/L (ref 19–32)
Calcium: 9.9 mg/dL (ref 8.4–10.5)
Chloride: 98 mEq/L (ref 96–112)
Potassium: 3.7 mEq/L (ref 3.5–5.3)
Sodium: 141 mEq/L (ref 135–145)
Total Protein: 6.9 g/dL (ref 6.0–8.3)

## 2008-11-14 ENCOUNTER — Telehealth: Payer: Self-pay | Admitting: Family Medicine

## 2008-12-06 ENCOUNTER — Telehealth: Payer: Self-pay | Admitting: Family Medicine

## 2008-12-20 ENCOUNTER — Ambulatory Visit: Payer: Self-pay | Admitting: Family Medicine

## 2008-12-20 ENCOUNTER — Telehealth: Payer: Self-pay | Admitting: Family Medicine

## 2008-12-20 DIAGNOSIS — J309 Allergic rhinitis, unspecified: Secondary | ICD-10-CM | POA: Insufficient documentation

## 2009-01-24 ENCOUNTER — Telehealth: Payer: Self-pay | Admitting: Family Medicine

## 2009-01-24 ENCOUNTER — Ambulatory Visit: Payer: Self-pay | Admitting: Family Medicine

## 2009-02-06 ENCOUNTER — Ambulatory Visit: Payer: Self-pay | Admitting: Family Medicine

## 2009-02-06 LAB — CONVERTED CEMR LAB: Hgb A1c MFr Bld: 8 %

## 2009-02-13 ENCOUNTER — Ambulatory Visit: Payer: Self-pay | Admitting: Pulmonary Disease

## 2009-02-13 DIAGNOSIS — Z9981 Dependence on supplemental oxygen: Secondary | ICD-10-CM

## 2009-02-13 LAB — CONVERTED CEMR LAB
BUN: 10 mg/dL (ref 6–23)
CO2: 34 meq/L — ABNORMAL HIGH (ref 19–32)
Calcium: 9.3 mg/dL (ref 8.4–10.5)
Chloride: 91 meq/L — ABNORMAL LOW (ref 96–112)
Creatinine, Ser: 0.7 mg/dL (ref 0.4–1.2)

## 2009-02-15 ENCOUNTER — Ambulatory Visit: Payer: Self-pay | Admitting: Pulmonary Disease

## 2009-02-15 ENCOUNTER — Telehealth: Payer: Self-pay | Admitting: Pulmonary Disease

## 2009-02-18 ENCOUNTER — Telehealth: Payer: Self-pay | Admitting: Pulmonary Disease

## 2009-02-18 ENCOUNTER — Telehealth (INDEPENDENT_AMBULATORY_CARE_PROVIDER_SITE_OTHER): Payer: Self-pay | Admitting: *Deleted

## 2009-02-20 ENCOUNTER — Telehealth (INDEPENDENT_AMBULATORY_CARE_PROVIDER_SITE_OTHER): Payer: Self-pay | Admitting: *Deleted

## 2009-03-01 ENCOUNTER — Encounter: Payer: Self-pay | Admitting: Family Medicine

## 2009-03-07 ENCOUNTER — Encounter: Payer: Self-pay | Admitting: Family Medicine

## 2009-03-12 ENCOUNTER — Telehealth: Payer: Self-pay | Admitting: Family Medicine

## 2009-03-20 ENCOUNTER — Ambulatory Visit: Payer: Self-pay | Admitting: Family Medicine

## 2009-03-20 ENCOUNTER — Ambulatory Visit (HOSPITAL_COMMUNITY): Admission: RE | Admit: 2009-03-20 | Discharge: 2009-03-20 | Payer: Self-pay | Admitting: Family Medicine

## 2009-03-20 ENCOUNTER — Encounter: Admission: RE | Admit: 2009-03-20 | Discharge: 2009-03-20 | Payer: Self-pay | Admitting: Oncology

## 2009-03-20 DIAGNOSIS — M549 Dorsalgia, unspecified: Secondary | ICD-10-CM | POA: Insufficient documentation

## 2009-03-22 ENCOUNTER — Encounter: Payer: Self-pay | Admitting: Family Medicine

## 2009-04-03 ENCOUNTER — Telehealth (INDEPENDENT_AMBULATORY_CARE_PROVIDER_SITE_OTHER): Payer: Self-pay | Admitting: *Deleted

## 2009-04-04 ENCOUNTER — Encounter: Payer: Self-pay | Admitting: Family Medicine

## 2009-04-11 ENCOUNTER — Telehealth: Payer: Self-pay | Admitting: Family Medicine

## 2009-05-07 ENCOUNTER — Telehealth: Payer: Self-pay | Admitting: Family Medicine

## 2009-05-08 ENCOUNTER — Telehealth: Payer: Self-pay | Admitting: Family Medicine

## 2009-05-08 ENCOUNTER — Ambulatory Visit: Payer: Self-pay | Admitting: Family Medicine

## 2009-05-13 ENCOUNTER — Telehealth: Payer: Self-pay | Admitting: Family Medicine

## 2009-05-13 ENCOUNTER — Ambulatory Visit: Payer: Self-pay | Admitting: Family Medicine

## 2009-05-13 DIAGNOSIS — J441 Chronic obstructive pulmonary disease with (acute) exacerbation: Secondary | ICD-10-CM

## 2009-05-20 ENCOUNTER — Telehealth: Payer: Self-pay | Admitting: Family Medicine

## 2009-06-03 ENCOUNTER — Telehealth: Payer: Self-pay | Admitting: Family Medicine

## 2009-06-10 ENCOUNTER — Telehealth (INDEPENDENT_AMBULATORY_CARE_PROVIDER_SITE_OTHER): Payer: Self-pay | Admitting: Family Medicine

## 2009-07-10 ENCOUNTER — Ambulatory Visit: Payer: Self-pay | Admitting: Family Medicine

## 2009-07-10 LAB — CONVERTED CEMR LAB: Direct LDL: 82 mg/dL

## 2009-07-11 ENCOUNTER — Encounter: Payer: Self-pay | Admitting: Family Medicine

## 2009-07-31 ENCOUNTER — Telehealth: Payer: Self-pay | Admitting: *Deleted

## 2009-08-01 ENCOUNTER — Telehealth (INDEPENDENT_AMBULATORY_CARE_PROVIDER_SITE_OTHER): Payer: Self-pay | Admitting: *Deleted

## 2009-08-13 ENCOUNTER — Telehealth: Payer: Self-pay | Admitting: Family Medicine

## 2009-08-14 ENCOUNTER — Ambulatory Visit: Payer: Self-pay | Admitting: Family Medicine

## 2009-08-22 ENCOUNTER — Emergency Department (HOSPITAL_COMMUNITY): Admission: EM | Admit: 2009-08-22 | Discharge: 2009-08-22 | Payer: Self-pay | Admitting: Family Medicine

## 2009-08-31 ENCOUNTER — Inpatient Hospital Stay (HOSPITAL_COMMUNITY): Admission: EM | Admit: 2009-08-31 | Discharge: 2009-09-01 | Payer: Self-pay | Admitting: Emergency Medicine

## 2009-08-31 ENCOUNTER — Ambulatory Visit: Payer: Self-pay | Admitting: Family Medicine

## 2009-08-31 ENCOUNTER — Telehealth: Payer: Self-pay | Admitting: Family Medicine

## 2009-08-31 ENCOUNTER — Encounter: Payer: Self-pay | Admitting: Family Medicine

## 2009-09-04 ENCOUNTER — Telehealth: Payer: Self-pay | Admitting: Family Medicine

## 2009-09-07 ENCOUNTER — Telehealth: Payer: Self-pay | Admitting: Family Medicine

## 2009-09-16 ENCOUNTER — Ambulatory Visit: Payer: Self-pay | Admitting: Family Medicine

## 2009-09-19 ENCOUNTER — Telehealth: Payer: Self-pay | Admitting: Family Medicine

## 2009-09-25 ENCOUNTER — Ambulatory Visit: Payer: Self-pay | Admitting: Family Medicine

## 2009-09-26 ENCOUNTER — Encounter: Payer: Self-pay | Admitting: Family Medicine

## 2009-09-26 ENCOUNTER — Ambulatory Visit: Payer: Self-pay | Admitting: Family Medicine

## 2009-09-26 LAB — CONVERTED CEMR LAB
Albumin: 4.5 g/dL (ref 3.5–5.2)
Alkaline Phosphatase: 87 units/L (ref 39–117)
HDL: 44 mg/dL (ref >39)
LDL Cholesterol: 116 mg/dL — ABNORMAL HIGH (ref 0–99)
Total Bilirubin: 0.3 mg/dL (ref 0.3–1.2)
Total CHOL/HDL Ratio: 4.1
Total Protein: 7 g/dL (ref 6.0–8.3)
Triglycerides: 109 mg/dL (ref <150)
VLDL: 22 mg/dL (ref 0–40)

## 2009-10-14 ENCOUNTER — Telehealth: Payer: Self-pay | Admitting: Family Medicine

## 2009-10-17 ENCOUNTER — Telehealth: Payer: Self-pay | Admitting: *Deleted

## 2009-10-23 ENCOUNTER — Encounter: Payer: Self-pay | Admitting: Family Medicine

## 2009-11-01 ENCOUNTER — Ambulatory Visit: Payer: Self-pay | Admitting: Oncology

## 2009-11-05 LAB — CBC WITH DIFFERENTIAL/PLATELET
BASO%: 0.6 % (ref 0.0–2.0)
EOS%: 3.1 % (ref 0.0–7.0)
LYMPH%: 32.1 % (ref 14.0–49.7)
MCH: 30 pg (ref 25.1–34.0)
MCHC: 33 g/dL (ref 31.5–36.0)
MCV: 90.7 fL (ref 79.5–101.0)
MONO%: 8.9 % (ref 0.0–14.0)
NEUT#: 4.3 10*3/uL (ref 1.5–6.5)
Platelets: 282 10*3/uL (ref 145–400)
RBC: 4.42 10*6/uL (ref 3.70–5.45)
RDW: 14.1 % (ref 11.2–14.5)

## 2009-11-05 LAB — COMPREHENSIVE METABOLIC PANEL
AST: 19 U/L (ref 0–37)
Albumin: 4.1 g/dL (ref 3.5–5.2)
Alkaline Phosphatase: 77 U/L (ref 39–117)
Potassium: 4 mEq/L (ref 3.5–5.3)
Sodium: 136 mEq/L (ref 135–145)
Total Bilirubin: 0.3 mg/dL (ref 0.3–1.2)
Total Protein: 7.2 g/dL (ref 6.0–8.3)

## 2009-11-12 ENCOUNTER — Encounter: Payer: Self-pay | Admitting: Internal Medicine

## 2009-11-12 ENCOUNTER — Encounter: Payer: Self-pay | Admitting: Family Medicine

## 2009-11-15 ENCOUNTER — Ambulatory Visit: Payer: Self-pay | Admitting: Family Medicine

## 2009-11-20 ENCOUNTER — Encounter: Payer: Self-pay | Admitting: Pulmonary Disease

## 2009-11-21 ENCOUNTER — Encounter: Payer: Self-pay | Admitting: Family Medicine

## 2009-12-04 ENCOUNTER — Encounter: Payer: Self-pay | Admitting: Pulmonary Disease

## 2009-12-05 ENCOUNTER — Telehealth: Payer: Self-pay | Admitting: Family Medicine

## 2009-12-18 ENCOUNTER — Encounter: Payer: Self-pay | Admitting: Family Medicine

## 2009-12-18 ENCOUNTER — Encounter: Payer: Self-pay | Admitting: Pulmonary Disease

## 2009-12-27 ENCOUNTER — Telehealth (INDEPENDENT_AMBULATORY_CARE_PROVIDER_SITE_OTHER): Payer: Self-pay | Admitting: Family Medicine

## 2010-01-04 ENCOUNTER — Emergency Department (HOSPITAL_COMMUNITY): Admission: EM | Admit: 2010-01-04 | Discharge: 2010-01-04 | Payer: Self-pay | Admitting: Family Medicine

## 2010-01-06 ENCOUNTER — Telehealth: Payer: Self-pay | Admitting: Family Medicine

## 2010-01-06 ENCOUNTER — Ambulatory Visit: Payer: Self-pay | Admitting: Family Medicine

## 2010-01-15 ENCOUNTER — Ambulatory Visit: Payer: Self-pay | Admitting: Family Medicine

## 2010-01-15 LAB — CONVERTED CEMR LAB: Hgb A1c MFr Bld: 7.7 %

## 2010-02-07 ENCOUNTER — Encounter: Payer: Self-pay | Admitting: Family Medicine

## 2010-02-10 ENCOUNTER — Telehealth (INDEPENDENT_AMBULATORY_CARE_PROVIDER_SITE_OTHER): Payer: Self-pay | Admitting: Family Medicine

## 2010-03-19 ENCOUNTER — Ambulatory Visit: Payer: Self-pay | Admitting: Family Medicine

## 2010-04-04 ENCOUNTER — Encounter: Admission: RE | Admit: 2010-04-04 | Discharge: 2010-04-04 | Payer: Self-pay | Admitting: Family Medicine

## 2010-04-07 ENCOUNTER — Telehealth: Payer: Self-pay | Admitting: Family Medicine

## 2010-04-21 ENCOUNTER — Encounter: Payer: Self-pay | Admitting: Family Medicine

## 2010-04-23 ENCOUNTER — Ambulatory Visit: Payer: Self-pay | Admitting: Family Medicine

## 2010-04-23 LAB — CONVERTED CEMR LAB: Hgb A1c MFr Bld: 7.4 %

## 2010-04-24 ENCOUNTER — Telehealth: Payer: Self-pay | Admitting: Family Medicine

## 2010-06-04 ENCOUNTER — Ambulatory Visit: Payer: Self-pay | Admitting: Family Medicine

## 2010-06-04 DIAGNOSIS — G4700 Insomnia, unspecified: Secondary | ICD-10-CM | POA: Insufficient documentation

## 2010-06-24 ENCOUNTER — Telehealth: Payer: Self-pay | Admitting: *Deleted

## 2010-06-26 ENCOUNTER — Ambulatory Visit: Payer: Self-pay | Admitting: Family Medicine

## 2010-06-27 ENCOUNTER — Telehealth: Payer: Self-pay | Admitting: Family Medicine

## 2010-06-30 ENCOUNTER — Telehealth: Payer: Self-pay | Admitting: Family Medicine

## 2010-07-16 ENCOUNTER — Telehealth (INDEPENDENT_AMBULATORY_CARE_PROVIDER_SITE_OTHER): Payer: Self-pay | Admitting: *Deleted

## 2010-07-21 ENCOUNTER — Emergency Department (HOSPITAL_COMMUNITY): Admission: EM | Admit: 2010-07-21 | Discharge: 2010-07-21 | Payer: Self-pay | Admitting: Family Medicine

## 2010-08-04 ENCOUNTER — Telehealth (INDEPENDENT_AMBULATORY_CARE_PROVIDER_SITE_OTHER): Payer: Self-pay | Admitting: Family Medicine

## 2010-08-06 ENCOUNTER — Encounter: Payer: Self-pay | Admitting: Family Medicine

## 2010-08-08 ENCOUNTER — Telehealth: Payer: Self-pay | Admitting: Family Medicine

## 2010-08-11 ENCOUNTER — Ambulatory Visit: Payer: Self-pay | Admitting: Family Medicine

## 2010-08-21 ENCOUNTER — Telehealth: Payer: Self-pay | Admitting: Sports Medicine

## 2010-09-03 ENCOUNTER — Encounter: Payer: Self-pay | Admitting: Family Medicine

## 2010-09-05 ENCOUNTER — Encounter: Payer: Self-pay | Admitting: Family Medicine

## 2010-09-09 ENCOUNTER — Encounter: Payer: Self-pay | Admitting: Family Medicine

## 2010-09-21 ENCOUNTER — Encounter: Payer: Self-pay | Admitting: Cardiovascular Disease

## 2010-09-25 ENCOUNTER — Emergency Department (HOSPITAL_COMMUNITY)
Admission: EM | Admit: 2010-09-25 | Discharge: 2010-09-25 | Payer: Self-pay | Source: Home / Self Care | Admitting: Emergency Medicine

## 2010-09-26 ENCOUNTER — Ambulatory Visit
Admission: RE | Admit: 2010-09-26 | Discharge: 2010-09-26 | Payer: Self-pay | Source: Home / Self Care | Attending: Family Medicine | Admitting: Family Medicine

## 2010-10-01 ENCOUNTER — Ambulatory Visit: Payer: Self-pay | Admitting: Family Medicine

## 2010-10-01 ENCOUNTER — Ambulatory Visit: Admit: 2010-10-01 | Payer: Self-pay | Admitting: Family Medicine

## 2010-10-02 ENCOUNTER — Emergency Department (HOSPITAL_COMMUNITY)
Admission: EM | Admit: 2010-10-02 | Discharge: 2010-10-02 | Disposition: A | Discharge status: Home / Self Care | Payer: MEDICARE | Attending: Emergency Medicine | Admitting: Emergency Medicine

## 2010-10-02 ENCOUNTER — Emergency Department (HOSPITAL_COMMUNITY): Payer: MEDICARE

## 2010-10-02 DIAGNOSIS — E7800 Pure hypercholesterolemia, unspecified: Secondary | ICD-10-CM | POA: Insufficient documentation

## 2010-10-02 DIAGNOSIS — J4489 Other specified chronic obstructive pulmonary disease: Secondary | ICD-10-CM | POA: Insufficient documentation

## 2010-10-02 DIAGNOSIS — R0609 Other forms of dyspnea: Secondary | ICD-10-CM | POA: Insufficient documentation

## 2010-10-02 DIAGNOSIS — Z853 Personal history of malignant neoplasm of breast: Secondary | ICD-10-CM | POA: Insufficient documentation

## 2010-10-02 DIAGNOSIS — R509 Fever, unspecified: Secondary | ICD-10-CM | POA: Insufficient documentation

## 2010-10-02 DIAGNOSIS — R05 Cough: Secondary | ICD-10-CM | POA: Insufficient documentation

## 2010-10-02 DIAGNOSIS — J449 Chronic obstructive pulmonary disease, unspecified: Secondary | ICD-10-CM | POA: Insufficient documentation

## 2010-10-02 DIAGNOSIS — R059 Cough, unspecified: Secondary | ICD-10-CM | POA: Insufficient documentation

## 2010-10-02 DIAGNOSIS — J40 Bronchitis, not specified as acute or chronic: Secondary | ICD-10-CM | POA: Insufficient documentation

## 2010-10-02 DIAGNOSIS — R110 Nausea: Secondary | ICD-10-CM | POA: Insufficient documentation

## 2010-10-02 DIAGNOSIS — E119 Type 2 diabetes mellitus without complications: Secondary | ICD-10-CM | POA: Insufficient documentation

## 2010-10-02 DIAGNOSIS — I10 Essential (primary) hypertension: Secondary | ICD-10-CM | POA: Insufficient documentation

## 2010-10-02 DIAGNOSIS — I509 Heart failure, unspecified: Secondary | ICD-10-CM | POA: Insufficient documentation

## 2010-10-02 DIAGNOSIS — R0989 Other specified symptoms and signs involving the circulatory and respiratory systems: Secondary | ICD-10-CM | POA: Insufficient documentation

## 2010-10-02 DIAGNOSIS — I2510 Atherosclerotic heart disease of native coronary artery without angina pectoris: Secondary | ICD-10-CM | POA: Insufficient documentation

## 2010-10-02 DIAGNOSIS — Z79899 Other long term (current) drug therapy: Secondary | ICD-10-CM | POA: Insufficient documentation

## 2010-10-02 NOTE — Progress Notes (Signed)
Summary: High CBG   Pt has BS of 494. Ate rice and is on prednisone. Pt says it has been as high as 700's before. Told pt to recheck BS in 3-4 hours. Do not eat or drink anything with sugar. Drink water and it should come down.  Jamie Brookes MD  September 07, 2009 6:59 PM

## 2010-10-02 NOTE — Assessment & Plan Note (Signed)
Summary: ck meds,df   Vital Signs:  Patient profile:   60 year old female Weight:      174 pounds Temp:     98.6 degrees F oral Pulse rate:   84 / minute Pulse rhythm:   regular BP sitting:   105 / 73  (left arm) Cuff size:   large  Vitals Entered By: Loralee Pacas CMA (September 03, 2010 10:55 AM) CC: med check   Primary Care Provider:  [name] MD  CC:  med check.  History of Present Illness: Follow up hypertension. Taking medicines regularly with no problems. Not having any any headaches or chest pains.   Follow-up hyperlipidemia. Trying to follow a good diet, taking medicines regularly. Not having any problems with medicines, no myalgias and no fatigue.   Marital stressors--husband is irritating her. says they do not sommunicate well. she is thinking of moving to her own apartment. She feels safe in the relationship, just not that happy. They never do anything together.   Habits & Providers  Alcohol-Tobacco-Diet     Tobacco Status: never     Tobacco Counseling: not indicated; no tobacco use     Year Quit: 5 YEARS AGO  Current Medications (verified): 1)  Albuterol 90 Mcg/act Aers (Albuterol) .... Inhale 2 Puff Using Inhaler Every Six Hours(Patient Prefers Not Hfa Propellant) 2)  Cartia Xt 180 Mg Cp24 (Diltiazem Hcl Coated Beads) .... Take 1 Capsule By Mouth Once A Day 3)  Flonase 50 Mcg/act Susp (Fluticasone Propionate) .... On Hold As Needed Use 4)  Glucophage 1000 Mg Tabs (Metformin Hcl) .... Take 1 Tablet By Mouth Twice A Day 5)  Klor-Con 10 10 Meq Tbcr (Potassium Chloride) .... Take 1 Tablet By Mouth Twice A Day 6)  Lasix 20 Mg Tabs (Furosemide) .... Take 1 Tablet By Mouth Twice A Day 7)  Lipitor 80 Mg Tabs (Atorvastatin Calcium) .... Take 1 Tablet By Mouth At Bedtime 8)  Benazepril Hcl 5 Mg Tabs (Benazepril Hcl) .... One By Mouth Daily 9)  Plavix 75 Mg Tabs (Clopidogrel Bisulfate) .Marland Kitchen.. 1 Tablet By Mouth Once A Day 10)  Ativan 2 Mg  Tabs (Lorazepam) .... Three  Times A Day 11)  Neurontin 300 Mg  Caps (Gabapentin) .... Two Times A Day 12)  Albuterol Sulfate (2.5 Mg/60ml) 0.083%  Nebu (Albuterol Sulfate) .... As Directed Up To One Vial Qid Per Nebulizer 13)  Theophylline Cr 300 Mg  Tb12 (Theophylline) .Marland Kitchen.. 1 By Mouth Two Times A Day 14)  Atrovent Hfa 17 Mcg/act Aers (Ipratropium Bromide Hfa) .... 4 Puffs Every 2 Hours As Needed For Shortness of Breath 15)  Omeprazole 40 Mg Cpdr (Omeprazole) .Marland Kitchen.. 1 By Mouth Qd 16)  Ipratropium-Albuterol 0.5-2.5 (3) Mg/1ml Soln (Ipratropium-Albuterol) .Marland Kitchen.. 1 Treatment Every 4 Hours Scheduled and Every 2 Hours As Needed For Coughing.  Take For The Next 5 Days 17)  Aspirin 81 Mg Tbec (Aspirin) .... Take 1 Tablet By Mouth Once A Day 18)  Risperidone 2 Mg Tabs (Risperidone) .... Sig 1/2 Tab By Mouth At Bedtime For 10 Days Then Increase To One Tab By Mouth Qhs 19)  Glucotrol 5 Mg Tabs (Glipizide) .... Take 1 Pill Daily 20)  Advair Diskus 250-50 Mcg/dose Aepb (Fluticasone-Salmeterol) .Marland Kitchen.. 1 Puff Bid 21)  Ferro-Bob 325 (65 Fe) Mg Tabs (Ferrous Sulfate) .Marland Kitchen.. 1 By Mouth Three Times A Day  Allergies: 1)  ! Aspirin 2)  Codeine 3)  * Ambien  Review of Systems  The patient denies anorexia, fever, weight loss,  weight gain, hoarseness, chest pain, syncope, dyspnea on exertion, peripheral edema, prolonged cough, headaches, abdominal pain, severe indigestion/heartburn, and depression.    Physical Exam  General:  alert, well-developed, well-nourished, and well-hydrated.   Neck:  supple, full ROM, and no masses.   Lungs:  normal breath sounds.   Heart:  normal rate, regular rhythm, and no murmur.   Abdomen:  soft, non-tender, and normal bowel sounds.   Psych:  Oriented X3, memory intact for recent and remote, normally interactive, good eye contact, not anxious appearing, and not depressed appearing.     Impression & Recommendations:  Problem # 1:  HYPERTENSION, BENIGN SYSTEMIC (ICD-401.1)  pretty good control her cardiologist  increased her beta blocker due to her episode of 'palpitations" Orders: [facility]- Est  Level 4 (16109)  Problem # 2:  HYPERCHOLESTEROLEMIA (ICD-272.0)  Orders: Direct LDL-[facility] (83721check LDLdirect as she just had a sausage biscuit and is not fasting-81033)   Problem # 3:  COUNSELING MARITAL&PARTNER PROBLEMS UNSPECIFIED (ICD-V61.10)  marital stressors--we spent >50% of our 40 minute ov in counseling and education re marital issues. She agreed to see if their minister would act as a 'referree" sos he and her husband could talk about some issues without him yelling Orders: [facility]- Est  Level 4 (99214)  Complete Medication List: 1)  Albuterol 90 Mcg/act Aers (Albuterol) .... Inhale 2 puff using inhaler every six hours(patient prefers not hfa propellant) 2)  Cartia Xt 180 Mg Cp24 (Diltiazem hcl coated beads) .... Take 1 capsule by mouth once a day 3)  Flonase 50 Mcg/act Susp (Fluticasone propionate) .... On hold as needed use 4)  Glucophage 1000 Mg Tabs (Metformin hcl) .... Take 1 tablet by mouth twice a day 5)  Klor-con 10 10 Meq Tbcr (Potassium chloride) .... Take 1 tablet by mouth twice a day 6)  Lasix 20 Mg Tabs (Furosemide) .... Take 1 tablet by mouth twice a day 7)  Lipitor 80 Mg Tabs (Atorvastatin calcium) .... Take 1 tablet by mouth at bedtime 8)  Benazepril Hcl 5 Mg Tabs (Benazepril hcl) .... One by mouth daily 9)  Plavix 75 Mg Tabs (Clopidogrel bisulfate) .Marland Kitchen.. 1 tablet by mouth once a day 10)  Ativan 2 Mg Tabs (Lorazepam) .... Three times a day 11)  Neurontin 300 Mg Caps (Gabapentin) .... Two times a day 12)  Albuterol Sulfate (2.5 Mg/8ml) 0.083% Nebu (Albuterol sulfate) .... As directed up to one vial qid per nebulizer 13)  Theophylline Cr 300 Mg Tb12 (Theophylline) .Marland Kitchen.. 1 by mouth two times a day 14)  Atrovent Hfa 17 Mcg/act Aers (Ipratropium bromide hfa) .... 4 puffs every 2 hours as needed for shortness of breath 15)  Omeprazole 40 Mg Cpdr (Omeprazole) .Marland Kitchen.. 1 by mouth qd 16)   Ipratropium-albuterol 0.5-2.5 (3) Mg/62ml Soln (Ipratropium-albuterol) .Marland Kitchen.. 1 treatment every 4 hours scheduled and every 2 hours as needed for coughing.  take for the next 5 days 17)  Aspirin 81 Mg Tbec (Aspirin) .... Take 1 tablet by mouth once a day 18)  Risperidone 2 Mg Tabs (Risperidone) .... Sig 1/2 tab by mouth at bedtime for 10 days then increase to one tab by mouth qhs 19)  Glucotrol 5 Mg Tabs (Glipizide) .... Take 1 pill daily 20)  Advair Diskus 250-50 Mcg/dose Aepb (Fluticasone-salmeterol) .Marland Kitchen.. 1 puff bid 21)  Ferro-bob 325 (65 Fe) Mg Tabs (Ferrous sulfate) .Marland Kitchen.. 1 by mouth three times a day Direct LDL-[facility] (60454-09811) [facility]- Est  Level 4 (91478)   Orders Added: 1)  Direct LDL-[facility] [  95621-30865] 2)  [facility]- Est  Level 4 [78469]     Impression & Recommendations:  Her updated medication list for this problem includes:    Cartia Xt 180 Mg Cp24 (Diltiazem hcl coated beads) .Marland Kitchen... Take 1 capsule by mouth once a day    Lasix 20 Mg Tabs (Furosemide) .Marland Kitchen... Take 1 tablet by mouth twice a day    Benazepril Hcl 5 Mg Tabs (Benazepril hcl) ..... One by mouth daily    Orders: [facility]- Est  Level 4 (62952)     Her updated medication list for this problem includes:    Lipitor 80 Mg Tabs (Atorvastatin calcium) .Marland Kitchen... Take 1 tablet by mouth at bedtime  Orders: Direct LDL-[facility] (84132-44010)    Orders: Direct LDL-[facility] (27253-66440) [facility]- Est  Level 4 (34742)      Orders: [facility]- Est  Level 4 (59563)    Complete Medication List: 1)  Albuterol 90 Mcg/act Aers (Albuterol) .... Inhale 2 puff using inhaler every six hours(patient prefers not hfa propellant) 2)  Cartia Xt 180 Mg Cp24 (Diltiazem hcl coated beads) .... Take 1 capsule by mouth once a day 3)  Flonase 50 Mcg/act Susp (Fluticasone propionate) .... On hold as needed use 4)  Glucophage 1000 Mg Tabs (Metformin hcl) .... Take 1 tablet by mouth twice a day 5)  Klor-con 10 10 Meq Tbcr (Potassium chloride) .... Take 1 tablet by mouth  twice a day 6)  Lasix 20 Mg Tabs (Furosemide) .... Take 1 tablet by mouth twice a day 7)  Lipitor 80 Mg Tabs (Atorvastatin calcium) .... Take 1 tablet by mouth at bedtime 8)  Benazepril Hcl 5 Mg Tabs (Benazepril hcl) .... One by mouth daily 9)  Plavix 75 Mg Tabs (Clopidogrel bisulfate) .Marland Kitchen.. 1 tablet by mouth once a day 10)  Ativan 2 Mg Tabs (Lorazepam) .... Three times a day 11)  Neurontin 300 Mg Caps (Gabapentin) .... Two times a day 12)  Albuterol Sulfate (2.5 Mg/90ml) 0.083% Nebu (Albuterol sulfate) .... As directed up to one vial qid per nebulizer 13)  Theophylline Cr 300 Mg Tb12 (Theophylline) .Marland Kitchen.. 1 by mouth two times a day 14)  Atrovent Hfa 17 Mcg/act Aers (Ipratropium bromide hfa) .... 4 puffs every 2 hours as needed for shortness of breath 15)  Omeprazole 40 Mg Cpdr (Omeprazole) .Marland Kitchen.. 1 by mouth qd 16)  Ipratropium-albuterol 0.5-2.5 (3) Mg/67ml Soln (Ipratropium-albuterol) .Marland Kitchen.. 1 treatment every 4 hours scheduled and every 2 hours as needed for coughing.  take for the next 5 days 17)  Aspirin 81 Mg Tbec (Aspirin) .... Take 1 tablet by mouth once a day 18)  Risperidone 2 Mg Tabs (Risperidone) .... Sig 1/2 tab by mouth at bedtime for 10 days then increase to one tab by mouth qhs 19)  Glucotrol 5 Mg Tabs (Glipizide) .... Take 1 pill daily 20)  Advair Diskus 250-50 Mcg/dose Aepb (Fluticasone-salmeterol) .Marland Kitchen.. 1 puff bid 21)  Ferro-bob 325 (65 Fe) Mg Tabs (Ferrous sulfate) .Marland Kitchen.. 1 by mouth three times a day   Prevention & Chronic Care Immunizations   Influenza vaccine: Fluvax MCR  (06/04/2010)   Influenza vaccine due: 05/30/2009    Tetanus booster: 05/30/2008: Tdap   Tetanus booster due: 05/30/2018    Pneumococcal vaccine: Done.  (05/31/2001)   Pneumococcal vaccine due: None    H. zoster vaccine: Not documented  Colorectal Screening   Hemoccult: not indicated  (07/03/2008)   Hemoccult due: Not Indicated    Colonoscopy: Adenomatous Polyp  (11/30/2002)   Colonoscopy due:  11/29/2012  Other Screening   Pap smear: has gyn provider  (07/04/2008)   Pap smear action/deferral: Not indicated-other  (05/08/2009)   Pap smear due: Not Indicated    Mammogram: ASSESSMENT: Negative - BI-RADS 1^MM DIGITAL SCREENING  (04/04/2010)   Mammogram due: 11/02/2009    DXA bone density scan: Not documented   Smoking status: never  (09/03/2010)  Diabetes Mellitus   HgbA1C: 7.4  (04/23/2010)   HgbA1C action/deferral: Ordered  (05/08/2009)   Hemoglobin A1C due: 12/15/2009    Eye exam: No diabetic retinopathy.     (10/21/2009)    Foot exam: yes  (07/16/2008)   Foot exam action/deferral: Do today   High risk foot: Not documented   Foot care education: Not documented   Foot exam due: 12/15/2009    Urine microalbumin/creatinine ratio: Not documented    Diabetes flowsheet reviewed?: Yes   Progress toward A1C goal: Unchanged  Lipids   Total Cholesterol: 182  (09/26/2009)   Lipid panel action/deferral: Lipid Panel ordered   LDL: 116  (09/26/2009)   LDL Direct: 82  (07/10/2009)   HDL: 44  (09/26/2009)   Triglycerides: 109  (09/26/2009)   Lipid panel due: 12/15/2009    SGOT (AST): 12  (09/26/2009)   BMP action: Ordered   SGPT (ALT): 13  (09/26/2009)   Alkaline phosphatase: 87  (09/26/2009)   Total bilirubin: 0.3  (09/26/2009)   Liver panel due: 12/15/2009    Lipid flowsheet reviewed?: Yes   Progress toward LDL goal: Unchanged  Hypertension   Last Blood Pressure: 105 / 73  (09/03/2010)   Serum creatinine: 0.7  (02/13/2009)   Serum potassium 4.0  (02/13/2009)   Basic metabolic panel due: 12/15/2009    Hypertension flowsheet reviewed?: Yes   Progress toward BP goal: At goal  Self-Management Support :   Personal Goals (by the next clinic visit) :     Personal A1C goal: 7  (01/15/2010)     Personal blood pressure goal: 130/80  (09/16/2009)     Personal LDL goal: 70  (09/16/2009)    Diabetes self-management support: Written self-care plan  (09/25/2009)     Diabetes self-management support not done because: Good outcomes  (07/10/2009)    Hypertension self-management support: Written self-care plan  (09/25/2009)    Hypertension self-management support not done because: Good outcomes  (09/16/2009)    Lipid self-management support: Written self-care plan  (09/25/2009)     Lipid self-management support not done because: Good outcomes  (07/10/2009)

## 2010-10-02 NOTE — Assessment & Plan Note (Signed)
Summary: elevated blood sugar,df   Vital Signs:  Patient profile:   60 year old female Height:      63.25 inches Weight:      171.3 pounds BMI:     30.21 Temp:     98.0 degrees F oral Pulse rate:   114 / minute BP sitting:   129 / 68  (right arm) Cuff size:   large  Vitals Entered By: Garen Grams LPN (September 16, 2009 10:17 AM) CC: BG running high x 2 days Is Patient Diabetic? Yes Did you bring your meter with you today? No Pain Assessment Patient in pain? no        Primary Care Provider:  [name] MD  CC:  BG running high x 2 days.  History of Present Illness: 1) Elevated BG: Patient reports elevated blood glucose levels over past week. Has been checking up  to 6 times per day. Notes highest CBG 500's after meals, fasting CBGs low 200's. On metformin 1000 mg BID, has not missed any doses. Finished 5 day course of prednisone on 09/07/09, w/ COPD exacerbation for which she had been hospitalized.  Reports that she has not been following diabetic diet at all - eating a lot of fast food (including steak biscuit, hash browns this morning from [employer]), and a lot of sweets and candy over the past few days. Reports some blurry vision, mild headache, increased urination, increased thirst. Denies chest pain, worsening dyspnea, LE edema, worsening cough, fever/chills, weight change, dysuria, hematuria, diarrhea, emesis, weakness, neurological symptoms, mental status change. Requesting a "shot of insulin" to help bring her CBGs down. Last A1C 7.1 in September 2010. A1C pending today.   2) HTN: Taking all medications w/o side effects. Denies chest pain, dyspnea, LE edema, neurological symptoms.    Current Medications (verified): 1)  Advair Diskus 500-50 Mcg/dose  Misc (Fluticasone-Salmeterol) .... One Puff Bid 2)  Albuterol 90 Mcg/act Aers (Albuterol) .... Inhale 2 Puff Using Inhaler Every Six Hours(Patient Prefers Not Hfa Propellant) 3)  Cartia Xt 180 Mg Cp24 (Diltiazem Hcl Coated Beads)  .... Take 1 Capsule By Mouth Once A Day 4)  Flonase 50 Mcg/act Susp (Fluticasone Propionate) .... On Hold As Needed Use 5)  Glucophage 1000 Mg Tabs (Metformin Hcl) .... Take 1 Tablet By Mouth Twice A Day 6)  Klor-Con 10 10 Meq Tbcr (Potassium Chloride) .... Take 1 Tablet By Mouth Twice A Day 7)  Lasix 20 Mg Tabs (Furosemide) .... Take 1 Tablet By Mouth Twice A Day 8)  Lipitor 80 Mg Tabs (Atorvastatin Calcium) .... Take 1 Tablet By Mouth At Bedtime 9)  Lotensin 5 Mg Tabs (Benazepril Hcl) .... Take 1 Tablet By Mouth Single Dose 10)  Plavix 75 Mg Tabs (Clopidogrel Bisulfate) .Marland Kitchen.. 1 Tablet By Mouth Once A Day 11)  Ativan 2 Mg  Tabs (Lorazepam) .... Three Times A Day 12)  Neurontin 300 Mg  Caps (Gabapentin) .... Two Times A Day 13)  Albuterol Sulfate (2.5 Mg/26ml) 0.083%  Nebu (Albuterol Sulfate) .... As Directed Up To One Vial Qid Per Nebulizer 14)  Theophylline Cr 300 Mg  Tb12 (Theophylline) .Marland Kitchen.. 1 By Mouth Two Times A Day 15)  Atrovent Hfa 17 Mcg/act Aers (Ipratropium Bromide Hfa) .... 4 Puffs Every 2 Hours As Needed For Shortness of Breath 16)  Allegra-D 12 Hour 60-120 Mg Xr12h-Tab (Fexofenadine-Pseudoephedrine) .... Two Times A Day Prn 17)  Omeprazole 40 Mg Cpdr (Omeprazole) .Marland Kitchen.. 1 By Mouth Qd 18)  Ipratropium-Albuterol 0.5-2.5 (3) Mg/37ml Soln (Ipratropium-Albuterol) .Marland KitchenMarland KitchenMarland Kitchen  1 Treatment Every 4 Hours Scheduled and Every 2 Hours As Needed For Coughing.  Take For The Next 5 Days 19)  Ferrous Sulfate 324 Mg Tabs (Ferrous Sulfate) .Marland Kitchen.. 1 By Mouth Three Times A Day 20)  Vicodin 5-500 Mg Tabs (Hydrocodone-Acetaminophen) .Marland Kitchen.. 1 By Mouth Two Times A Day Prn 21)  Aspirin 81 Mg Tbec (Aspirin) .... Take 1 Tablet By Mouth Once A Day 22)  Doxycycline Hyclate 100 Mg Tabs (Doxycycline Hyclate) .... Take 1 Tab By Mouth Twice A Day For 10 Days 23)  Miralax  Powd (Polyethylene Glycol 3350) .... One Capful Dissolved in 8 Ounces of Fluid By Mouth Once Daily As Needed For Constipation. Disp: One Month Supply. Refill:  Prn 24)  Risperidone 2 Mg Tabs (Risperidone) .... Sig 1/2 Tab By Mouth At Bedtime For 10 Days Then Increase To One Tab By Mouth Qhs  Allergies (verified): 1)  ! Aspirin 2)  Codeine  Review of Systems       as per HPI   Physical Exam  General:  obese, alert, NAD  Eyes:  vision grossly intact, pupils equal, pupils round, and pupils reactive to light.   Mouth:   oropharynx pink and moist and no erythema or post exudates Neck:  supple and full ROM, no cervical lymphadenopathy  or JVD  Lungs:  CTAB posterior  Heart:  RRR no murmurs  Abdomen:  soft, non-tender, normal bowel sounds, and no distention.   Msk:  normal ROM.   Pulses:  2+ distal pulses Extremities:  no lower extremity edema Neurologic:  alert & oriented X3, cranial nerves II-XII intact, strength normal in all extremities, sensation intact to light touch, and sensation intact to pinprick.     Impression & Recommendations:  Problem # 1:  DIABETES MELLITUS II, UNCOMPLICATED (ICD-250.00) Assessment Deteriorated Hyperglycemia very likely secondary to poor compliance. No signs of infection or ischemia on exam or history. Counseled patient for 15 minutes on diabetic diet. Patient agreeable to starting o follow diabetic diet. Advised to follow up with Dr. [name] regarding possibility of adjunctive medication. Awaiting A1C result. Continue metformin, ASA, ACE-I.   ****Of note, patient wishes to have Dr. Doree Albee be her new physician as she liked interacting with him during her hospitalization. Will forward to Dr. [name], to Dr. Alvester Morin as well. ****  Her updated medication list for this problem includes:    Glucophage 1000 Mg Tabs (Metformin hcl) .Marland Kitchen... Take 1 tablet by mouth twice a day    Lotensin 5 Mg Tabs (Benazepril hcl) .Marland Kitchen... Take 1 tablet by mouth single dose    Aspirin 81 Mg Tbec (Aspirin) .Marland Kitchen... Take 1 tablet by mouth once a day  Orders: A1C-[facility] (35573) Glucose-[facility] (22025-42706) [facility]- Est Level  3 (23762)  Problem # 2:   HYPERTENSION, BENIGN SYSTEMIC (ICD-401.1) Assessment: Unchanged  At goal. Continue meds as below. Discussed DASH diet, increasing activity for 15 minutes.  Her updated medication list for this problem includes:    Cartia Xt 180 Mg Cp24 (Diltiazem hcl coated beads) .Marland Kitchen... Take 1 capsule by mouth once a day    Lasix 20 Mg Tabs (Furosemide) .Marland Kitchen... Take 1 tablet by mouth twice a day    Lotensin 5 Mg Tabs (Benazepril hcl) .Marland Kitchen... Take 1 tablet by mouth single dose  Orders: [facility]- Est Level  3 (83151)  Complete Medication List: 1)  Advair Diskus 500-50 Mcg/dose Misc (Fluticasone-salmeterol) .... One puff bid 2)  Albuterol 90 Mcg/act Aers (Albuterol) .... Inhale 2 puff using inhaler every six hours(patient  prefers not hfa propellant) 3)  Cartia Xt 180 Mg Cp24 (Diltiazem hcl coated beads) .... Take 1 capsule by mouth once a day 4)  Flonase 50 Mcg/act Susp (Fluticasone propionate) .... On hold as needed use 5)  Glucophage 1000 Mg Tabs (Metformin hcl) .... Take 1 tablet by mouth twice a day 6)  Klor-con 10 10 Meq Tbcr (Potassium chloride) .... Take 1 tablet by mouth twice a day 7)  Lasix 20 Mg Tabs (Furosemide) .... Take 1 tablet by mouth twice a day 8)  Lipitor 80 Mg Tabs (Atorvastatin calcium) .... Take 1 tablet by mouth at bedtime 9)  Lotensin 5 Mg Tabs (Benazepril hcl) .... Take 1 tablet by mouth single dose 10)  Plavix 75 Mg Tabs (Clopidogrel bisulfate) .Marland Kitchen.. 1 tablet by mouth once a day 11)  Ativan 2 Mg Tabs (Lorazepam) .... Three times a day 12)  Neurontin 300 Mg Caps (Gabapentin) .... Two times a day 13)  Albuterol Sulfate (2.5 Mg/73ml) 0.083% Nebu (Albuterol sulfate) .... As directed up to one vial qid per nebulizer 14)  Theophylline Cr 300 Mg Tb12 (Theophylline) .Marland Kitchen.. 1 by mouth two times a day 15)  Atrovent Hfa 17 Mcg/act Aers (Ipratropium bromide hfa) .... 4 puffs every 2 hours as needed for shortness of breath 16)  Allegra-d 12 Hour 60-120 Mg Xr12h-tab (Fexofenadine-pseudoephedrine) .... Two  times a day prn 17)  Omeprazole 40 Mg Cpdr (Omeprazole) .Marland Kitchen.. 1 by mouth qd 18)  Ipratropium-albuterol 0.5-2.5 (3) Mg/41ml Soln (Ipratropium-albuterol) .Marland Kitchen.. 1 treatment every 4 hours scheduled and every 2 hours as needed for coughing.  take for the next 5 days 19)  Ferrous Sulfate 324 Mg Tabs (Ferrous sulfate) .Marland Kitchen.. 1 by mouth three times a day 20)  Vicodin 5-500 Mg Tabs (Hydrocodone-acetaminophen) .Marland Kitchen.. 1 by mouth two times a day prn 21)  Aspirin 81 Mg Tbec (Aspirin) .... Take 1 tablet by mouth once a day 22)  Doxycycline Hyclate 100 Mg Tabs (Doxycycline hyclate) .... Take 1 tab by mouth twice a day for 10 days 23)  Miralax Powd (Polyethylene glycol 3350) .... One capful dissolved in 8 ounces of fluid by mouth once daily as needed for constipation. disp: one month supply. refill: prn 24)  Risperidone 2 Mg Tabs (Risperidone) .... Sig 1/2 tab by mouth at bedtime for 10 days then increase to one tab by mouth qhs  Patient Instructions: 1)  It was great to see you today!  2)  Try to stick with your diabetic diet.  3)  Follow up with Dr. Alvester Morin as scheduled, or Dr. [name] to discuss changing your medication.  4)  Check your blood sugars first thing when you wake up and two hours after your biggest meal and record the numbers and bring them into your next appointment.   Laboratory Results   Blood Tests   Date/Time Received: September 16, 2009 10:04 AM  Date/Time Reported: September 16, 2009 10:25 AM   HGBA1C: 8.4%   (Normal Range: Non-Diabetic - 3-6%   Control Diabetic - 6-8%)  Comments: ...............test performed by......Marland KitchenBonnie A. [place], MLS (ASCP)cm       Prevention & Chronic Care Immunizations   Influenza vaccine: Fluvax 3+  (05/30/2008)   Influenza vaccine due: 05/30/2009    Tetanus booster: 05/30/2008: Tdap   Tetanus booster due: 05/30/2018    Pneumococcal vaccine: Done.  (05/31/2001)   Pneumococcal vaccine due: None    H. zoster vaccine: Not documented  Colorectal  Screening   Hemoccult: not indicated  (07/03/2008)   Hemoccult due:  Not Indicated    Colonoscopy: Adenomatous Polyp  (11/30/2002)   Colonoscopy due: 11/29/2012  Other Screening   Pap smear: has gyn provider  (07/04/2008)   Pap smear action/deferral: Not indicated-other  (05/08/2009)   Pap smear due: Not Indicated    Mammogram: BI-RADS CATEGORY 2:  Benign finding(s).^MM DIGITAL DIAGNOSTIC UNILAT L  (11/02/2008)   Mammogram due: 11/02/2009    DXA bone density scan: Not documented   Smoking status: quit  (07/10/2009)  Diabetes Mellitus   HgbA1C: 8.4  (09/16/2009)   HgbA1C action/deferral: Ordered  (05/08/2009)   Hemoglobin A1C due: 12/15/2009    Eye exam: Not documented    Foot exam: yes  (07/16/2008)   High risk foot: Not documented   Foot care education: Not documented   Foot exam due: 12/15/2009    Urine microalbumin/creatinine ratio: Not documented    Diabetes flowsheet reviewed?: Yes   Progress toward A1C goal: Deteriorated  Lipids   Total Cholesterol: 162  (08/01/2008)   Lipid panel action/deferral: LDL Direct Ordered   LDL: 85  (08/01/2008)   LDL Direct: 82  (07/10/2009)   HDL: 56  (08/01/2008)   Triglycerides: 103  (08/01/2008)   Lipid panel due: 12/15/2009    SGOT (AST): 14  (08/01/2008)   SGPT (ALT): 12  (08/01/2008)   Alkaline phosphatase: 72  (08/01/2008)   Total bilirubin: 0.3  (08/01/2008)   Liver panel due: 12/15/2009    Lipid flowsheet reviewed?: Yes   Progress toward LDL goal: Unchanged  Hypertension   Last Blood Pressure: 129 / 68  (09/16/2009)   Serum creatinine: 0.7  (02/13/2009)   Serum potassium 4.0  (02/13/2009)   Basic metabolic panel due: 12/15/2009    Hypertension flowsheet reviewed?: Yes   Progress toward BP goal: At goal  Self-Management Support :   Personal Goals (by the next clinic visit) :     Personal A1C goal: 7  (09/16/2009)     Personal blood pressure goal: 130/80  (09/16/2009)     Personal LDL goal: 70   (09/16/2009)    Patient will work on the following items until the next clinic visit to reach self-care goals:     Medications and monitoring: take my medicines every day, check my blood sugar, check my blood pressure, bring all of my medications to every visit, weigh myself weekly, examine my feet every day  (09/16/2009)     Eating: drink diet soda or water instead of juice or soda, eat more vegetables, use fresh or frozen vegetables, eat foods that are low in salt, eat baked foods instead of fried foods, eat fruit for snacks and desserts, limit or avoid alcohol  (09/16/2009)     Activity: take a 30 minute walk every day  (09/16/2009)    Diabetes self-management support: Copy of home glucose meter record, CBG self-monitoring log, Written self-care plan, Education handout  (09/16/2009)   Diabetes care plan printed   Diabetes education handout printed    Diabetes self-management support not done because: Good outcomes  (07/10/2009)    Hypertension self-management support: Not documented    Hypertension self-management support not done because: Good outcomes  (09/16/2009)    Lipid self-management support: Written self-care plan  (09/16/2009)   Lipid self-care plan printed.    Lipid self-management support not done because: Good outcomes  (07/10/2009)

## 2010-10-02 NOTE — Miscellaneous (Signed)
Summary: Zolpidem refill  Clinical Lists Changes  Medications: Removed medication of DOXYCYCLINE HYCLATE 100 MG TABS (DOXYCYCLINE HYCLATE) Take 1 tab by mouth twice a day for 10 days Removed medication of VICODIN 5-500 MG TABS (HYDROCODONE-ACETAMINOPHEN) 1 by mouth two times a day prn Removed medication of FERROUS SULFATE 324 MG TABS (FERROUS SULFATE) 1 by mouth three times a day    recd request for refill zolpidem--I cannot find where I have ever rx that--rx denied

## 2010-10-02 NOTE — Assessment & Plan Note (Signed)
Summary: UC states Gallbladder needs to come out/Alexis/neal   Vital Signs:  Patient profile:   60 year old female Height:      63.25 inches Weight:      170.8 pounds BMI:     30.13 Temp:     98.2 degrees F oral Pulse rate:   91 / minute BP sitting:   117 / 78  (left arm) Cuff size:   regular  Vitals Entered By: Gladstone Pih (Jan 06, 2010 1:27 PM) CC: C/O UC stated she needs to have gallbladder reoved Is Patient Diabetic? Yes Did you bring your meter with you today? No Pain Assessment Patient in pain? no        Primary Care Provider:  [name] MD  CC:  C/O UC stated she needs to have gallbladder reoved.  History of Present Illness: 1.  abd pain--past 6 weeks.  decreased appetite.  eating less than normal.  feels weak and fatigued.  sometimes nauseated after eating.  vomitted X1.  did have some lower back pain and abdominal pain (mild), but these are not particularly worse after eating.  also complains of lots of "belching."  thinks she has lost some weight.  went to urgent care and told she may have gallbladder disease.  after some discussion, pt tells me that she thinks these symptoms got worse after she stopped her omeprazole.    of note, pt states she has had multiple UGI studies past few years.   has appt withe cardiologist for stress test this week.  Habits & Providers  Alcohol-Tobacco-Diet     Tobacco Status: never  Allergies: 1)  ! Aspirin 2)  Codeine  Social History: Smoking Status:  never  Review of Systems General:  Complains of loss of appetite, weakness, and weight loss; denies fever. GI:  Complains of abdominal pain, gas, indigestion, loss of appetite, and nausea; denies bloody stools and diarrhea; mild constipation. GU:  Denies dysuria. Derm:  Denies changes in color of skin.  Physical Exam  General:  Well-developed,well-nourished,in no acute distress; alert,appropriate and cooperative throughout examination Abdomen:  soft, non-tender, normal bowel  sounds, no distention, no masses, no guarding, no rigidity, and no rebound tenderness.  negative murphy's sign Additional Exam:  vital signs reviewed    Impression & Recommendations:  Problem # 1:  ABDOMINAL PAIN (ICD-789.00) Assessment New  Her symptoms are not classic for cholecystitis or cholelithiasis, and her abd exam is unimpressive.  She had a u/a and labs done at urgent care which were essentially within normal limits.  We discussed the options and agreed that she would start back on her omeprazole and follow up in 2-3 weeks to make sure she is getting better.  If she is not, will consider abd ultrasound at that time.    Orders: [facility]- Est Level  3 (78295)  Complete Medication List: 1)  Advair Diskus 500-50 Mcg/dose Misc (Fluticasone-salmeterol) .... One puff bid 2)  Albuterol 90 Mcg/act Aers (Albuterol) .... Inhale 2 puff using inhaler every six hours(patient prefers not hfa propellant) 3)  Cartia Xt 180 Mg Cp24 (Diltiazem hcl coated beads) .... Take 1 capsule by mouth once a day 4)  Flonase 50 Mcg/act Susp (Fluticasone propionate) .... On hold as needed use 5)  Glucophage 1000 Mg Tabs (Metformin hcl) .... Take 1 tablet by mouth twice a day 6)  Klor-con 10 10 Meq Tbcr (Potassium chloride) .... Take 1 tablet by mouth twice a day 7)  Lasix 20 Mg Tabs (Furosemide) .... Take  1 tablet by mouth twice a day 8)  Lipitor 80 Mg Tabs (Atorvastatin calcium) .... Take 1 tablet by mouth at bedtime 9)  Benazepril Hcl 5 Mg Tabs (Benazepril hcl) .... One by mouth daily 10)  Plavix 75 Mg Tabs (Clopidogrel bisulfate) .Marland Kitchen.. 1 tablet by mouth once a day 11)  Ativan 2 Mg Tabs (Lorazepam) .... Three times a day 12)  Neurontin 300 Mg Caps (Gabapentin) .... Two times a day 13)  Albuterol Sulfate (2.5 Mg/44ml) 0.083% Nebu (Albuterol sulfate) .... As directed up to one vial qid per nebulizer 14)  Theophylline Cr 300 Mg Tb12 (Theophylline) .Marland Kitchen.. 1 by mouth two times a day 15)  Atrovent Hfa 17 Mcg/act Aers  (Ipratropium bromide hfa) .... 4 puffs every 2 hours as needed for shortness of breath 16)  Allegra-d 12 Hour 60-120 Mg Xr12h-tab (Fexofenadine-pseudoephedrine) .... Two times a day prn 17)  Omeprazole 40 Mg Cpdr (Omeprazole) .Marland Kitchen.. 1 by mouth qd 18)  Ipratropium-albuterol 0.5-2.5 (3) Mg/90ml Soln (Ipratropium-albuterol) .Marland Kitchen.. 1 treatment every 4 hours scheduled and every 2 hours as needed for coughing.  take for the next 5 days 19)  Ferrous Sulfate 324 Mg Tabs (Ferrous sulfate) .Marland Kitchen.. 1 by mouth three times a day 20)  Vicodin 5-500 Mg Tabs (Hydrocodone-acetaminophen) .Marland Kitchen.. 1 by mouth two times a day prn 21)  Aspirin 81 Mg Tbec (Aspirin) .... Take 1 tablet by mouth once a day 22)  Doxycycline Hyclate 100 Mg Tabs (Doxycycline hyclate) .... Take 1 tab by mouth twice a day for 10 days 23)  Miralax Powd (Polyethylene glycol 3350) .... One capful dissolved in 8 ounces of fluid by mouth once daily as needed for constipation. disp: one month supply. refill: prn 24)  Risperidone 2 Mg Tabs (Risperidone) .... Sig 1/2 tab by mouth at bedtime for 10 days then increase to one tab by mouth qhs 25)  Glucotrol 5 Mg Tabs (Glipizide) .... Take 1 pill daily  Patient Instructions: 1)  It was nice to see you today. 2)  Restart your omeprazole. 3)  Please schedule a follow-up appointment in 2-3 weeks with myself or Dr [name] to make sure you are getting better.

## 2010-10-02 NOTE — Progress Notes (Signed)
Summary: Rx Ques  Phone Note Call from Patient Call back at [facility] Phone [phone]   Caller: Patient Summary of Call: Wants to ask some questions about some medicine. Initial call taken by: Clydell Hakim,  October 17, 2009 4:22 PM  Follow-up for Phone Call        patient  wants to know if  "Focus Factor "  that is for brain enhancement and to help memory is ok for her to take.  Follow-up by: Theresia Lo RN,  October 17, 2009 4:43 PM  Additional Follow-up for Phone Call Additional follow up Details #1::        Never heard of it and would not recommend.  [name] MD  October 18, 2009 8:24 AM     Additional Follow-up for Phone Call Additional follow up Details #2::    called and left vm and informed pt not to take "focus factor" Dr. [name] did not recommend taking this supplement. Follow-up by: Loralee Pacas CMA,  October 18, 2009 10:21 AM

## 2010-10-02 NOTE — Progress Notes (Signed)
Summary: triage  Phone Note Call from Patient Call back at Home Phone [phone]   Caller: Patient Summary of Call: pt feels like she has a gas build up in her throat and can't belch - also has a pain in her back.  wants to know what to do. Initial call taken by: De Nurse,  June 24, 2010 10:17 AM  Follow-up for Phone Call        Pt states that it is definatley a GI issue.  Says it feels like gas is trapped in her esophagus.  Has been taking her Omeprazole as directed (40 mg once daily).  Advised her to try an OTC for gas and to increase her Omeprazole to 40 two times a day for a couple of says to see if that helps.  Pt also c/o leg cramps at HS and is wondering if her K dose is too low.  Scheduled her an appt to discuss both the GI and leg cramp issues for this thursday. Follow-up by: Dennison Nancy RN,  June 24, 2010 10:46 AM

## 2010-10-02 NOTE — Progress Notes (Signed)
  Phone Note Refill Request Call back at [phone]   Maria Gaines need refill on her Ferrous Sulfate rx.   Haven't had any since last Thursday.  Feel very fatigue and unable to function doing daily chores.    Initial call taken by: Abundio Miu,  June 30, 2010 4:36 PM

## 2010-10-02 NOTE — Letter (Signed)
Summary: [facility]  [facility]   Imported By: Sherian Rein 12/02/2009 09:30:20  _____________________________________________________________________  External Attachment:    Type:   Image     Comment:   External Document

## 2010-10-02 NOTE — Progress Notes (Signed)
Summary: EMERGENCY LINE CALL  Phone Note Call from Patient Call back at Home Phone [phone]   Caller: Patient Summary of Call: 60 yo female calling emergency line wondering if she can turn up her oxygen.  She has COPD, last admission for exacerbation 08/31/09.  She has no cough, no change in her level of SOB from baseline, no change in her CP from baseline, no wheeze, no URI symptoms, just feels like she is is not getting enough air in.  I advised that if she was feeling SOB she should be evaluated soon however she says she feels fine and not difft from baseline.  She has an appt in the new year  however I advised she could go to the [facility] to ensure she was not hypoxic if she wanted and also informed her that oxygen levels too high in a pt with COPD could be equally disastrous.  She was very happy to know this and will present immediately to a [facility] if SOB becomes worse but otherwise plans to go to the [facility] in the morning. Initial call taken by: Rodney Langton MD,  August 21, 2010 9:53 PM

## 2010-10-02 NOTE — Progress Notes (Signed)
Summary: phn msg  Phone Note Call from Patient   Caller: Patient Summary of Call: Pt feeling better and did not feel like she needed to come in today. Initial call taken by: Clydell Hakim,  October 14, 2009 10:05 AM  Follow-up for Phone Call        To Dr Alvester Morin, apt today with him at 11am Follow-up by: Gladstone Pih,  October 14, 2009 10:18 AM

## 2010-10-02 NOTE — Progress Notes (Signed)
Summary: meds prob  Phone Note Call from Patient Call back at Home Phone [phone]   Caller: Patient Summary of Call: pt is wondering why the iron pills were denied Initial call taken by: De Nurse,  June 27, 2010 8:37 AM  Follow-up for Phone Call        will forward to preceptor. I denied this , this week because it is not on patient's med list and was waiting for Dr. Donnetta Hail return next week. will send to preceptor to ask about refilling ferrous sulfate. patient states she has been out of this med since Tuesday and she is concerned that her iron will get too low if she waits.  Follow-up by: Theresia Lo RN,  June 27, 2010 8:48 AM  Additional Follow-up for Phone Call Additional follow up Details #1::        Her iron level won't drop quickly off the iron. No CBC has been done for a couple years. Looks like a chemistry is also due. Please convey to the patient that we will ask Dr [name] about lab tests that she may want done before she sees the patient.  Additional Follow-up by: Zachery Dauer MD,  June 27, 2010 10:05 AM     Appended Document: meds prob patient  given message from  Dr.Hale and advised that we we call her back after Dr. [name] has responded.

## 2010-10-02 NOTE — Consult Note (Signed)
Summary: Opthalmology  Opthalmology   Imported By: Clydell Hakim 11/01/2009 13:55:57  _____________________________________________________________________  External Attachment:    Type:   Image     Comment:   External Document

## 2010-10-02 NOTE — Assessment & Plan Note (Signed)
Summary: hfu,df   Vital Signs:  Patient profile:   60 year old female Height:      63.25 inches Weight:      171 pounds BMI:     30.16 Temp:     97.7 degrees F oral Pulse rate:   77 / minute BP sitting:   98 / 66  (right arm) Cuff size:   regular  Vitals Entered By: Tessie Fass CMA (September 25, 2009 1:35 PM) CC: hospital f/u Is Patient Diabetic? Yes Pain Assessment Patient in pain? no        Primary Care Provider:  [name] MD  CC:  hospital f/u.  History of Present Illness: Pt hospital followup for COPD exacerbation. Pt hospitalized <24 hours w/ COPD exacerbation, recieved stress dose steroids and po abx to finish in outpt setting. Pt clinically stable since discharge w/o COPD symptomatology since discharge.  DM: Pt concerned that blood sugars have "run high" over lat 2-3 weeks ranging in 300-400 range in setting of recent stress dose steroid use. Most recent HbA1C @ 8.4 vs. 7.0 05/2009. Pt asymptomatic w/o c/o polyuria, polydypsia, polyphagia, weakness, lightheadedness.   Habits & Providers  Alcohol-Tobacco-Diet     Tobacco Status: never  Allergies: 1)  ! Aspirin 2)  Codeine  Social History: Smoking Status:  never  Physical Exam  General:  alert and overweight-appearing.   Head:  normocephalic and atraumatic.   Eyes:  vision grossly intact.   Nose:  no external deformity.   Mouth:  good dentition.   Neck:  supple and full ROM.   Lungs:  normal respiratory effort, no intercostal retractions, no accessory muscle use, and normal breath sounds.   Heart:  normal rate, regular rhythm, and no murmur.   Abdomen:  obese, NT, +BS   Impression & Recommendations:  Problem # 1:  CHRONIC OBSTRUCTIVE PULMONARY DISEASE, ACUTE EXACERBATION (ICD-491.21) COPD stable on current regimen. No indication for medication change. Will continue to follow pulmonary status. In setting of hx/o significant anxiety, there may be a component of anxiety attacks w/ increased WOB ass'd w/  most recent hospitalization. Pt advised to begin exercise regimen as this may help in alleviation of home stressors. Orders: [facility]- Est Level  3 ([address])  Problem # 2:  DIABETES MELLITUS II, UNCOMPLICATED (ICD-250.00) Will add on glipizide in setting persistent hyperglycemia on previous medication regimen. Pt otherwise well controlled prior to most recent HbA1C. ? Stress dose steroid regimen as exacerbating component of recent hyperglycemia. Will likely reassess blood sugar management in setting of new medication in 2-3 months. Pt otherwise stable.  Her updated medication list for this problem includes:    Glucophage 1000 Mg Tabs (Metformin hcl) .Marland Kitchen... Take 1 tablet by mouth twice a day    Lotensin 5 Mg Tabs (Benazepril hcl) .Marland Kitchen... Take 1 tablet by mouth single dose    Aspirin 81 Mg Tbec (Aspirin) .Marland Kitchen... Take 1 tablet by mouth once a day    Glucotrol 5 Mg Tabs (Glipizide) .Marland Kitchen... Take 1 pill daily  Complete Medication List: 1)  Advair Diskus 500-50 Mcg/dose Misc (Fluticasone-salmeterol) .... One puff bid 2)  Albuterol 90 Mcg/act Aers (Albuterol) .... Inhale 2 puff using inhaler every six hours(patient prefers not hfa propellant) 3)  Cartia Xt 180 Mg Cp24 (Diltiazem hcl coated beads) .... Take 1 capsule by mouth once a day 4)  Flonase 50 Mcg/act Susp (Fluticasone propionate) .... On hold as needed use 5)  Glucophage 1000 Mg Tabs (Metformin hcl) .... Take 1 tablet by mouth  twice a day 6)  Klor-con 10 10 Meq Tbcr (Potassium chloride) .... Take 1 tablet by mouth twice a day 7)  Lasix 20 Mg Tabs (Furosemide) .... Take 1 tablet by mouth twice a day 8)  Lipitor 80 Mg Tabs (Atorvastatin calcium) .... Take 1 tablet by mouth at bedtime 9)  Lotensin 5 Mg Tabs (Benazepril hcl) .... Take 1 tablet by mouth single dose 10)  Plavix 75 Mg Tabs (Clopidogrel bisulfate) .Marland Kitchen.. 1 tablet by mouth once a day 11)  Ativan 2 Mg Tabs (Lorazepam) .... Three times a day 12)  Neurontin 300 Mg Caps (Gabapentin) .... Two times a  day 13)  Albuterol Sulfate (2.5 Mg/60ml) 0.083% Nebu (Albuterol sulfate) .... As directed up to one vial qid per nebulizer 14)  Theophylline Cr 300 Mg Tb12 (Theophylline) .Marland Kitchen.. 1 by mouth two times a day 15)  Atrovent Hfa 17 Mcg/act Aers (Ipratropium bromide hfa) .... 4 puffs every 2 hours as needed for shortness of breath 16)  Allegra-d 12 Hour 60-120 Mg Xr12h-tab (Fexofenadine-pseudoephedrine) .... Two times a day prn 17)  Omeprazole 40 Mg Cpdr (Omeprazole) .Marland Kitchen.. 1 by mouth qd 18)  Ipratropium-albuterol 0.5-2.5 (3) Mg/3ml Soln (Ipratropium-albuterol) .Marland Kitchen.. 1 treatment every 4 hours scheduled and every 2 hours as needed for coughing.  take for the next 5 days 19)  Ferrous Sulfate 324 Mg Tabs (Ferrous sulfate) .Marland Kitchen.. 1 by mouth three times a day 20)  Vicodin 5-500 Mg Tabs (Hydrocodone-acetaminophen) .Marland Kitchen.. 1 by mouth two times a day prn 21)  Aspirin 81 Mg Tbec (Aspirin) .... Take 1 tablet by mouth once a day 22)  Doxycycline Hyclate 100 Mg Tabs (Doxycycline hyclate) .... Take 1 tab by mouth twice a day for 10 days 23)  Miralax Powd (Polyethylene glycol 3350) .... One capful dissolved in 8 ounces of fluid by mouth once daily as needed for constipation. disp: one month supply. refill: prn 24)  Risperidone 2 Mg Tabs (Risperidone) .... Sig 1/2 tab by mouth at bedtime for 10 days then increase to one tab by mouth qhs 25)  Glucotrol 5 Mg Tabs (Glipizide) .... Take 1 pill daily  Other Orders: Future Orders: T-Hepatic Function [id number]) ... 09/26/2009 T-Lipid Profile [id number]) ... 09/26/2009 Prescriptions: GLUCOTROL 5 MG TABS (GLIPIZIDE) take 1 pill daily  #30 x 2   Entered and Authorized by:   Doree Albee MD   Signed by:   Doree Albee MD on 09/25/2009   Method used:   [facility]ronically to        [facility]  [address] [id number] ([facility]       [address]       [place], [place]  [address]       Ph: [phone] or [phone]       Fax: [phone]   RxID:    [id number]    Prevention & Chronic Care Immunizations   Influenza vaccine: Fluvax 3+  (05/30/2008)   Influenza vaccine due: 05/30/2009    Tetanus booster: 05/30/2008: Tdap   Tetanus booster due: 05/30/2018    Pneumococcal vaccine: Done.  (05/31/2001)   Pneumococcal vaccine due: None    H. zoster vaccine: Not documented  Colorectal Screening   Hemoccult: not indicated  (07/03/2008)   Hemoccult due: Not Indicated    Colonoscopy: Adenomatous Polyp  (11/30/2002)   Colonoscopy due: 11/29/2012  Other Screening   Pap smear: has gyn provider  (07/04/2008)   Pap smear action/deferral: Not indicated-other  (05/08/2009)   Pap smear due: Not  Indicated    Mammogram: BI-RADS CATEGORY 2:  Benign finding(s).^MM DIGITAL DIAGNOSTIC UNILAT L  (11/02/2008)   Mammogram due: 11/02/2009    DXA bone density scan: Not documented   Smoking status: never  (09/25/2009)  Diabetes Mellitus   HgbA1C: 8.4  (09/16/2009)   HgbA1C action/deferral: Ordered  (05/08/2009)   Hemoglobin A1C due: 12/15/2009    Eye exam: Not documented    Foot exam: yes  (07/16/2008)   Foot exam action/deferral: Do today   High risk foot: Not documented   Foot care education: Not documented   Foot exam due: 12/15/2009    Urine microalbumin/creatinine ratio: Not documented   Progress toward A1C goal: Deteriorated  Lipids   Total Cholesterol: 162  (08/01/2008)   Lipid panel action/deferral: Lipid Panel ordered   LDL: 85  (08/01/2008)   LDL Direct: 82  (07/10/2009)   HDL: 56  (08/01/2008)   Triglycerides: 103  (08/01/2008)   Lipid panel due: 12/15/2009    SGOT (AST): 14  (08/01/2008)   BMP action: Ordered   SGPT (ALT): 12  (08/01/2008)   Alkaline phosphatase: 72  (08/01/2008)   Total bilirubin: 0.3  (08/01/2008)   Liver panel due: 12/15/2009  Hypertension   Last Blood Pressure: 98 / 66  (09/25/2009)   Serum creatinine: 0.7  (02/13/2009)   Serum potassium 4.0  (02/13/2009)   Basic metabolic panel  due: 12/15/2009  Self-Management Support :   Personal Goals (by the next clinic visit) :     Personal A1C goal: 7  (09/16/2009)     Personal blood pressure goal: 130/80  (09/16/2009)     Personal LDL goal: 70  (09/16/2009)    Diabetes self-management support: Written self-care plan  (09/25/2009)   Diabetes care plan printed    Diabetes self-management support not done because: Good outcomes  (07/10/2009)    Hypertension self-management support: Written self-care plan  (09/25/2009)   Hypertension self-care plan printed.    Hypertension self-management support not done because: Good outcomes  (09/16/2009)    Lipid self-management support: Written self-care plan  (09/25/2009)   Lipid self-care plan printed.    Lipid self-management support not done because: Good outcomes  (07/10/2009)   Nursing Instructions: Diabetic foot exam today    Prevention & Chronic Care Immunizations   Influenza vaccine: Fluvax 3+  (05/30/2008)   Influenza vaccine due: 05/30/2009    Tetanus booster: 05/30/2008: Tdap   Tetanus booster due: 05/30/2018    Pneumococcal vaccine: Done.  (05/31/2001)   Pneumococcal vaccine due: None    H. zoster vaccine: Not documented  Colorectal Screening   Hemoccult: not indicated  (07/03/2008)   Hemoccult due: Not Indicated    Colonoscopy: Adenomatous Polyp  (11/30/2002)   Colonoscopy due: 11/29/2012  Other Screening   Pap smear: has gyn provider  (07/04/2008)   Pap smear action/deferral: Not indicated-other  (05/08/2009)   Pap smear due: Not Indicated    Mammogram: BI-RADS CATEGORY 2:  Benign finding(s).^MM DIGITAL DIAGNOSTIC UNILAT L  (11/02/2008)   Mammogram due: 11/02/2009    DXA bone density scan: Not documented   Smoking status: never  (09/25/2009)  Diabetes Mellitus   HgbA1C: 8.4  (09/16/2009)   HgbA1C action/deferral: Ordered  (05/08/2009)   Hemoglobin A1C due: 12/15/2009    Eye exam: Not documented    Foot exam: yes  (07/16/2008)   Foot  exam action/deferral: Do today   High risk foot: Not documented   Foot care education: Not documented   Foot exam due: 12/15/2009  Urine microalbumin/creatinine ratio: Not documented   Progress toward A1C goal: Deteriorated  Lipids   Total Cholesterol: 162  (08/01/2008)   Lipid panel action/deferral: Lipid Panel ordered   LDL: 85  (08/01/2008)   LDL Direct: 82  (07/10/2009)   HDL: 56  (08/01/2008)   Triglycerides: 103  (08/01/2008)   Lipid panel due: 12/15/2009    SGOT (AST): 14  (08/01/2008)   BMP action: Ordered   SGPT (ALT): 12  (08/01/2008)   Alkaline phosphatase: 72  (08/01/2008)   Total bilirubin: 0.3  (08/01/2008)   Liver panel due: 12/15/2009  Hypertension   Last Blood Pressure: 98 / 66  (09/25/2009)   Serum creatinine: 0.7  (02/13/2009)   Serum potassium 4.0  (02/13/2009)   Basic metabolic panel due: 12/15/2009  Self-Management Support :   Personal Goals (by the next clinic visit) :     Personal A1C goal: 7  (09/16/2009)     Personal blood pressure goal: 130/80  (09/16/2009)     Personal LDL goal: 70  (09/16/2009)    Diabetes self-management support: Written self-care plan  (09/25/2009)   Diabetes care plan printed    Diabetes self-management support not done because: Good outcomes  (07/10/2009)    Hypertension self-management support: Written self-care plan  (09/25/2009)   Hypertension self-care plan printed.    Hypertension self-management support not done because: Good outcomes  (09/16/2009)    Lipid self-management support: Written self-care plan  (09/25/2009)   Lipid self-care plan printed.    Lipid self-management support not done because: Good outcomes  (07/10/2009)

## 2010-10-02 NOTE — Consult Note (Signed)
Summary: [facility]   Imported By: Clydell Hakim 02/27/2010 12:09:22  _____________________________________________________________________  External Attachment:    Type:   Image     Comment:   External Document

## 2010-10-02 NOTE — Progress Notes (Signed)
Summary: phn msg  Phone Note Call from Patient Call back at Home Phone [phone]   Caller: Patient Summary of Call: has a question about medicines- would like to talk to nurse Initial call taken by: De Nurse,  August 08, 2010 3:30 PM  Follow-up for Phone Call        Per dr. [name] have pt make an appt. Follow-up by: Jimmy Footman, CMA,  August 08, 2010 5:15 PM  Additional Follow-up for Phone Call Additional follow up Details #1::        spoke with pt and she will call back main number to make appt. with Dr. [name] to discuss meds Additional Follow-up by: Jimmy Footman, CMA,  August 11, 2010 10:18 AM

## 2010-10-02 NOTE — Progress Notes (Signed)
Summary: triage  Phone Note Call from Patient Call back at Home Phone [phone]   Caller: Patient Summary of Call: Pt says her pressure is up and it's bad.  Might have to go to hospital. Initial call taken by: Clydell Hakim,  April 24, 2010 3:55 PM  Follow-up for Phone Call        95/58 p72. states she went to a barbecue today & ate a great deal. has cbg of 224. tried to walk outside and could not because of the heat. advised drinking at least 2 glasses of water & staying inside where it is cool. has a spouse that will look after her. advised eating lightly. to call back if condition changes. she agreed with plan Follow-up by: Golden Circle RN,  April 24, 2010 4:01 PM  Additional Follow-up for Phone Call Additional follow up Details #1::        so...blood suhgar not BP I gues  Agree with plan

## 2010-10-02 NOTE — Progress Notes (Signed)
Summary: triage  Phone Note Call from Patient Call back at Home Phone [phone]   Caller: Patient Summary of Call: having problems with sugar - needs to talk to nurse Initial call taken by: De Nurse,  September 19, 2009 9:21 AM  Follow-up for Phone Call        she is very concerned about her high cbgs. this am cbg was 322.  states they have been running in the 200s to 300s. yesterday for supper ate barbecue chicken leg, beans, potatoes. no butter or gravy. cannot remember breakfast or lunch. later in converstion ate scrambled eggs in oil & cheese. loves cheese & fries most foods. explained how fats have twice the calories of other foods. told her to avoid butter,margerine, oil, shortening, bacon grease . can try low fat cheese & gave her amount to have. limit quanity. loves salad dressing. told her it was full of fats & has sugar as well.  to get low fat & use sparingly. asked her to keep a food diary which will help identify sources of hidden fats & calories. no exercise. states she walked around the inside of the house 3 times the other day. explained she needed to do more. suggested sitting exercises, using soup cans for weights & working arms. walk more frequently. she loves to dance. told her to put on some fasy music & enjoy dancing. spouse has diabetes as well & he does not check his sugar or eat right or exercise. told her her changes may rub off on him. he sees another md at a different office. to continue to drink plenty of water. states she has given up snacks. told me of a dinner recently where sh ate 2 egg rolls and a meal. explained there was fat since they were fried. states she never knew about fats & diabetes. states she will try these suggestions. asked her to call me in a few days to see how she is doing  Follow-up by: Golden Circle RN,  September 19, 2009 9:25 AM

## 2010-10-02 NOTE — Progress Notes (Signed)
Summary: refill  Phone Note Refill Request Call back at Home Phone [phone] Message from:  Patient  Refills Requested: Medication #1:  ATIVAN 2 MG  TABS three times a day Initial call taken by: De Nurse,  December 27, 2009 10:06 AM  Follow-up for Phone Call        called into [facility] notified Follow-up by: Gladstone Pih,  December 27, 2009 3:40 PM    Prescriptions: ATIVAN 2 MG  TABS (LORAZEPAM) three times a day  #90 x 5   Entered and Authorized by:   [name] MD   Signed by:   [name] MD on 12/27/2009   Method used:   [facility] to ...       [facility]  [address] [id number]       [address]       [place], [place]  [address]       Ph: [phone] or [phone]       Fax: [phone]   RxID:   [id number]   DEAR Adarsh Mundorf TEAM can  u plz call this in Thanks!  [name] MD  December 27, 2009 3:20 PM

## 2010-10-02 NOTE — Consult Note (Signed)
Summary: Oxygen Device Eval Form  Oxygen Device Eval Form   Imported By: De Nurse 09/15/2010 14:10:51  _____________________________________________________________________  External Attachment:    Type:   Image     Comment:   External Document

## 2010-10-02 NOTE — Progress Notes (Signed)
Summary: triage  Phone Note Call from Patient Call back at Home Phone [phone]   Caller: Patient Summary of Call: Pt went to urgent care saturday.  they think her gallbladder needs removing.   Initial call taken by: Clydell Hakim,  Jan 06, 2010 9:13 AM  Follow-up for Phone Call        states she has been having intermittant pain & nausea. UC ran tests & told her she needs the GB removed. appt today at 1:30 with Dr. Lafonda Mosses. advised her no fatty or greasy foods. she does not have anything for the pain. aware her pcp will not be seeing her Follow-up by: Golden Circle RN,  Jan 06, 2010 9:21 AM

## 2010-10-02 NOTE — Consult Note (Signed)
Summary: [facility]  [facility]   Imported By: Clydell Hakim 12/23/2009 16:21:45  _____________________________________________________________________  External Attachment:    Type:   Image     Comment:   External Document

## 2010-10-02 NOTE — Letter (Signed)
[facility]   Imported By: Sherian Rein 12/03/2009 12:05:33  _____________________________________________________________________  External Attachment:    Type:   Image     Comment:   External Document

## 2010-10-02 NOTE — Miscellaneous (Signed)
Summary: OPTHO EXAM  Clinical Lists Changes  Observations: Added new observation of DIAB EYE EX: No diabetic retinopathy.    (10/21/2009 9:38)      Diabetic Eye Exam  Procedure date:  10/21/2009  Findings:      No diabetic retinopathy.      Diabetic Eye Exam  Procedure date:  10/21/2009  Findings:      No diabetic retinopathy.

## 2010-10-02 NOTE — Assessment & Plan Note (Signed)
Summary: f/up,tcb   Vital Signs:  Patient profile:   60 year old female Weight:      176 pounds Temp:     98.5 degrees F oral Pulse rate:   85 / minute Pulse rhythm:   regular BP sitting:   108 / 67  (right arm) Cuff size:   large  Vitals Entered By: Loralee Pacas CMA (June 04, 2010 9:51 AM) CC: follow-up visit   Primary Care Provider:  [name] MD  CC:  follow-up visit.  History of Present Illness: 1) marital stresses re: husbands inability to have intercourse. wants to know what options he potentially has so she can discuss more fully with him. 2) Insomnia--tried the sleeping pill I gave her(????) and she sleep walked, hit a bedside table, knocked it over and has abig bruise onher leg. difficulty falling asleep and staying asleep---finds herself morrying about issues  3)Follow up hypertension. Taking medicines regularly with no problems. Not having any any headaches or chest pains.  4) Breast CA;; had f/u and still doing well  Habits & Providers  Alcohol-Tobacco-Diet     Tobacco Status: never     Tobacco Counseling: not indicated; no tobacco use     Year Quit: 5 YEARS AGO  Current Medications (verified): 1)  Albuterol 90 Mcg/act Aers (Albuterol) .... Inhale 2 Puff Using Inhaler Every Six Hours(Patient Prefers Not Hfa Propellant) 2)  Cartia Xt 180 Mg Cp24 (Diltiazem Hcl Coated Beads) .... Take 1 Capsule By Mouth Once A Day 3)  Flonase 50 Mcg/act Susp (Fluticasone Propionate) .... On Hold As Needed Use 4)  Glucophage 1000 Mg Tabs (Metformin Hcl) .... Take 1 Tablet By Mouth Twice A Day 5)  Klor-Con 10 10 Meq Tbcr (Potassium Chloride) .... Take 1 Tablet By Mouth Twice A Day 6)  Lasix 20 Mg Tabs (Furosemide) .... Take 1 Tablet By Mouth Twice A Day 7)  Lipitor 80 Mg Tabs (Atorvastatin Calcium) .... Take 1 Tablet By Mouth At Bedtime 8)  Benazepril Hcl 5 Mg Tabs (Benazepril Hcl) .... One By Mouth Daily 9)  Plavix 75 Mg Tabs (Clopidogrel Bisulfate) .Marland Kitchen.. 1 Tablet By Mouth  Once A Day 10)  Ativan 2 Mg  Tabs (Lorazepam) .... Three Times A Day 11)  Neurontin 300 Mg  Caps (Gabapentin) .... Two Times A Day 12)  Albuterol Sulfate (2.5 Mg/96ml) 0.083%  Nebu (Albuterol Sulfate) .... As Directed Up To One Vial Qid Per Nebulizer 13)  Theophylline Cr 300 Mg  Tb12 (Theophylline) .Marland Kitchen.. 1 By Mouth Two Times A Day 14)  Atrovent Hfa 17 Mcg/act Aers (Ipratropium Bromide Hfa) .... 4 Puffs Every 2 Hours As Needed For Shortness of Breath 15)  Allegra-D 12 Hour 60-120 Mg Xr12h-Tab (Fexofenadine-Pseudoephedrine) .... Two Times A Day Prn 16)  Omeprazole 40 Mg Cpdr (Omeprazole) .Marland Kitchen.. 1 By Mouth Qd 17)  Ipratropium-Albuterol 0.5-2.5 (3) Mg/56ml Soln (Ipratropium-Albuterol) .Marland Kitchen.. 1 Treatment Every 4 Hours Scheduled and Every 2 Hours As Needed For Coughing.  Take For The Next 5 Days 18)  Aspirin 81 Mg Tbec (Aspirin) .... Take 1 Tablet By Mouth Once A Day 19)  Miralax  Powd (Polyethylene Glycol 3350) .... One Capful Dissolved in 8 Ounces of Fluid By Mouth Once Daily As Needed For Constipation. Disp: One Month Supply. Refill: Prn 20)  Risperidone 2 Mg Tabs (Risperidone) .... Sig 1/2 Tab By Mouth At Bedtime For 10 Days Then Increase To One Tab By Mouth Qhs 21)  Glucotrol 5 Mg Tabs (Glipizide) .... Take 1 Pill Daily 22)  Advair Diskus 250-50 Mcg/dose Aepb (Fluticasone-Salmeterol) .Marland Kitchen.. 1 Puff Bid  Allergies: 1)  ! Aspirin 2)  Codeine 3)  * Ambien  Review of Systems  The patient denies anorexia, fever, weight loss, weight gain, chest pain, syncope, dyspnea on exertion, hemoptysis, and muscle weakness.    Physical Exam  General:  alert, well-developed, well-nourished, and well-hydrated.   Neck:  supple, full ROM, and no masses.   Lungs:  normal respiratory effort and normal breath sounds.   Heart:  normal rate and regular rhythm.   Psych:  Oriented X3, memory intact for recent and remote, normally interactive, good eye contact, not anxious appearing, and not depressed appearing.      Impression & Recommendations:  Problem # 1:  INSOMNIA (ICD-780.52)  Orders: [facility]- Est  Level 4 (16109) we discussed her sleep issues--seems related to her marital stress in many ways. I told her to throw OUT her [place]. Discussed sleep hygiene  Problem # 2:  DIABETES MELLITUS II, UNCOMPLICATED (ICD-250.00)  Her updated medication list for this problem includes:    Glucophage 1000 Mg Tabs (Metformin hcl) .Marland Kitchen... Take 1 tablet by mouth twice a day    Benazepril Hcl 5 Mg Tabs (Benazepril hcl) ..... One by mouth daily    Aspirin 81 Mg Tbec (Aspirin) .Marland Kitchen... Take 1 tablet by mouth once a day    Glucotrol 5 Mg Tabs (Glipizide) .Marland Kitchen... Take 1 pill daily  Orders: [facility]- Est  Level 4 (99214)  Problem # 3:  COUNSELING MARITAL&PARTNER PROBLEMS UNSPECIFIED (ICD-V61.10)  Orders: [facility]- Est  Level 4 (99214) > 50% 35 minute ov spent in education and counseling re marital stressors--her husband is impotent and we discussed that in some detail including options  Complete Medication List: 1)  Albuterol 90 Mcg/act Aers (Albuterol) .... Inhale 2 puff using inhaler every six hours(patient prefers not hfa propellant) 2)  Cartia Xt 180 Mg Cp24 (Diltiazem hcl coated beads) .... Take 1 capsule by mouth once a day 3)  Flonase 50 Mcg/act Susp (Fluticasone propionate) .... On hold as needed use 4)  Glucophage 1000 Mg Tabs (Metformin hcl) .... Take 1 tablet by mouth twice a day 5)  Klor-con 10 10 Meq Tbcr (Potassium chloride) .... Take 1 tablet by mouth twice a day 6)  Lasix 20 Mg Tabs (Furosemide) .... Take 1 tablet by mouth twice a day 7)  Lipitor 80 Mg Tabs (Atorvastatin calcium) .... Take 1 tablet by mouth at bedtime 8)  Benazepril Hcl 5 Mg Tabs (Benazepril hcl) .... One by mouth daily 9)  Plavix 75 Mg Tabs (Clopidogrel bisulfate) .Marland Kitchen.. 1 tablet by mouth once a day 10)  Ativan 2 Mg Tabs (Lorazepam) .... Three times a day 11)  Neurontin 300 Mg Caps (Gabapentin) .... Two times a day 12)  Albuterol Sulfate (2.5  Mg/64ml) 0.083% Nebu (Albuterol sulfate) .... As directed up to one vial qid per nebulizer 13)  Theophylline Cr 300 Mg Tb12 (Theophylline) .Marland Kitchen.. 1 by mouth two times a day 14)  Atrovent Hfa 17 Mcg/act Aers (Ipratropium bromide hfa) .... 4 puffs every 2 hours as needed for shortness of breath 15)  Allegra-d 12 Hour 60-120 Mg Xr12h-tab (Fexofenadine-pseudoephedrine) .... Two times a day prn 16)  Omeprazole 40 Mg Cpdr (Omeprazole) .Marland Kitchen.. 1 by mouth qd 17)  Ipratropium-albuterol 0.5-2.5 (3) Mg/15ml Soln (Ipratropium-albuterol) .Marland Kitchen.. 1 treatment every 4 hours scheduled and every 2 hours as needed for coughing.  take for the next 5 days 18)  Aspirin 81 Mg Tbec (Aspirin) .... Take 1 tablet  by mouth once a day 19)  Miralax Powd (Polyethylene glycol 3350) .... One capful dissolved in 8 ounces of fluid by mouth once daily as needed for constipation. disp: one month supply. refill: prn 20)  Risperidone 2 Mg Tabs (Risperidone) .... Sig 1/2 tab by mouth at bedtime for 10 days then increase to one tab by mouth qhs 21)  Glucotrol 5 Mg Tabs (Glipizide) .... Take 1 pill daily 22)  Advair Diskus 250-50 Mcg/dose Aepb (Fluticasone-salmeterol) .Marland Kitchen.. 1 puff bid  Other Orders: Influenza Vaccine MCR (81191)   Immunizations Administered:  Influenza Vaccine # 1:    Vaccine Type: Fluvax MCR    Site: right deltoid    Mfr: [email or web address]    Dose: 0.5 ml    Route: IM    Given by: Loralee Pacas CMA    Exp. Date: 02/25/2011    Lot #: [id number]    VIS given: 03/25/10 version given June 04, 2010.  Flu Vaccine Consent Questions:    Do you have a history of severe allergic reactions to this vaccine? no    Any prior history of allergic reactions to egg and/or gelatin? no    Do you have a sensitivity to the preservative Thimersol? no    Do you have a past history of Guillan-Barre Syndrome? no    Do you currently have an acute febrile illness? no    Have you ever had a severe reaction to latex? no    Vaccine information  given and explained to patient? yes    Are you currently pregnant? no  Prevention & Chronic Care Immunizations   Influenza vaccine: Fluvax MCR  (06/04/2010)   Influenza vaccine due: 05/30/2009    Tetanus booster: 05/30/2008: Tdap   Tetanus booster due: 05/30/2018    Pneumococcal vaccine: Done.  (05/31/2001)   Pneumococcal vaccine due: None    H. zoster vaccine: Not documented  Colorectal Screening   Hemoccult: not indicated  (07/03/2008)   Hemoccult due: Not Indicated    Colonoscopy: Adenomatous Polyp  (11/30/2002)   Colonoscopy due: 11/29/2012  Other Screening   Pap smear: has gyn provider  (07/04/2008)   Pap smear action/deferral: Not indicated-other  (05/08/2009)   Pap smear due: Not Indicated    Mammogram: ASSESSMENT: Negative - BI-RADS 1^MM DIGITAL SCREENING  (04/04/2010)   Mammogram due: 11/02/2009    DXA bone density scan: Not documented   Smoking status: never  (06/04/2010)  Diabetes Mellitus   HgbA1C: 7.4  (04/23/2010)   HgbA1C action/deferral: Ordered  (05/08/2009)   Hemoglobin A1C due: 12/15/2009    Eye exam: No diabetic retinopathy.     (10/21/2009)    Foot exam: yes  (07/16/2008)   Foot exam action/deferral: Do today   High risk foot: Not documented   Foot care education: Not documented   Foot exam due: 12/15/2009    Urine microalbumin/creatinine ratio: Not documented    Diabetes flowsheet reviewed?: Yes   Progress toward A1C goal: Unchanged  Lipids   Total Cholesterol: 182  (09/26/2009)   Lipid panel action/deferral: Lipid Panel ordered   LDL: 116  (09/26/2009)   LDL Direct: 82  (07/10/2009)   HDL: 44  (09/26/2009)   Triglycerides: 109  (09/26/2009)   Lipid panel due: 12/15/2009    SGOT (AST): 12  (09/26/2009)   BMP action: Ordered   SGPT (ALT): 13  (09/26/2009)   Alkaline phosphatase: 87  (09/26/2009)   Total bilirubin: 0.3  (09/26/2009)   Liver panel due: 12/15/2009    Lipid flowsheet reviewed?:  Yes   Progress toward LDL goal:  Unchanged  Hypertension   Last Blood Pressure: 108 / 67  (06/04/2010)   Serum creatinine: 0.7  (02/13/2009)   Serum potassium 4.0  (02/13/2009)   Basic metabolic panel due: 12/15/2009    Hypertension flowsheet reviewed?: Yes   Progress toward BP goal: At goal  Self-Management Support :   Personal Goals (by the next clinic visit) :     Personal A1C goal: 7  (01/15/2010)     Personal blood pressure goal: 130/80  (09/16/2009)     Personal LDL goal: 70  (09/16/2009)    Diabetes self-management support: Written self-care plan  (09/25/2009)    Diabetes self-management support not done because: Good outcomes  (07/10/2009)    Hypertension self-management support: Written self-care plan  (09/25/2009)    Hypertension self-management support not done because: Good outcomes  (09/16/2009)    Lipid self-management support: Written self-care plan  (09/25/2009)     Lipid self-management support not done because: Good outcomes  (07/10/2009)

## 2010-10-02 NOTE — Assessment & Plan Note (Signed)
Summary: check moles on neck,tcb   Vital Signs:  Patient profile:   60 year old female Weight:      175.2 pounds Temp:     98.4 degrees F oral Pulse rate:   75 / minute Pulse rhythm:   regular BP sitting:   121 / 67  (right arm) Cuff size:   regular  Vitals Entered By: Loralee Pacas CMA (March 19, 2010 8:42 AM) CC: check moles on neck   Primary Care Provider:  [name] MD  CC:  check moles on neck.  History of Present Illness: Irritated lesions on her neck--driving her crazy. Worried they might be skin cancer as some have gotten larger.  PERTINENT PMH/PSH: Breast cancer history On plavix  Allergies: 1)  ! Aspirin 2)  Codeine  Review of Systems  The patient denies anorexia, weight gain, and hoarseness.    Physical Exam  General:  alert, well-developed, well-nourished, and well-hydrated.   Neck:  supple, full ROM, no masses, no thyromegaly, no thyroid nodules or tenderness, and no carotid bruits.   Skin:  multiple normal appearing skin tags in neck area, some darly pigmented, some flesh color. None have any worrsome features. Additional Exam:  Informed consent for skin  tag removal I used 1% lidocaine with epi (total of 2 cc for all skin tags) as local anasthesia. Then each pedunculated skin tag was crushed at the base wih a hemostat and snipped off with scissors. Minimal bleeding. Bandaid aplied to 2 of the 6 removed tags. Patient tolerated well   Impression & Recommendations:  Problem # 1:  SKIN TAG (ICD-701.9)  Orders: [facility]- Est Level  3 (99213) Skin Tags (up to 15) - [facility] (11200)  Problem # 2:  BREAST CANCER (ICD-174.9)  Complete Medication List: 1)  Advair Diskus 500-50 Mcg/dose Misc (Fluticasone-salmeterol) .... One puff bid 2)  Albuterol 90 Mcg/act Aers (Albuterol) .... Inhale 2 puff using inhaler every six hours(patient prefers not hfa propellant) 3)  Cartia Xt 180 Mg Cp24 (Diltiazem hcl coated beads) .... Take 1 capsule by mouth once a day 4)  Flonase 50  Mcg/act Susp (Fluticasone propionate) .... On hold as needed use 5)  Glucophage 1000 Mg Tabs (Metformin hcl) .... Take 1 tablet by mouth twice a day 6)  Klor-con 10 10 Meq Tbcr (Potassium chloride) .... Take 1 tablet by mouth twice a day 7)  Lasix 20 Mg Tabs (Furosemide) .... Take 1 tablet by mouth twice a day 8)  Lipitor 80 Mg Tabs (Atorvastatin calcium) .... Take 1 tablet by mouth at bedtime 9)  Benazepril Hcl 5 Mg Tabs (Benazepril hcl) .... One by mouth daily 10)  Plavix 75 Mg Tabs (Clopidogrel bisulfate) .Marland Kitchen.. 1 tablet by mouth once a day 11)  Ativan 2 Mg Tabs (Lorazepam) .... Three times a day 12)  Neurontin 300 Mg Caps (Gabapentin) .... Two times a day 13)  Albuterol Sulfate (2.5 Mg/17ml) 0.083% Nebu (Albuterol sulfate) .... As directed up to one vial qid per nebulizer 14)  Theophylline Cr 300 Mg Tb12 (Theophylline) .Marland Kitchen.. 1 by mouth two times a day 15)  Atrovent Hfa 17 Mcg/act Aers (Ipratropium bromide hfa) .... 4 puffs every 2 hours as needed for shortness of breath 16)  Allegra-d 12 Hour 60-120 Mg Xr12h-tab (Fexofenadine-pseudoephedrine) .... Two times a day prn 17)  Omeprazole 40 Mg Cpdr (Omeprazole) .Marland Kitchen.. 1 by mouth qd 18)  Ipratropium-albuterol 0.5-2.5 (3) Mg/2ml Soln (Ipratropium-albuterol) .Marland Kitchen.. 1 treatment every 4 hours scheduled and every 2 hours as needed for coughing.  take for the next 5 days 19)  Ferrous Sulfate 324 Mg Tabs (Ferrous sulfate) .Marland Kitchen.. 1 by mouth three times a day 20)  Vicodin 5-500 Mg Tabs (Hydrocodone-acetaminophen) .Marland Kitchen.. 1 by mouth two times a day prn 21)  Aspirin 81 Mg Tbec (Aspirin) .... Take 1 tablet by mouth once a day 22)  Doxycycline Hyclate 100 Mg Tabs (Doxycycline hyclate) .... Take 1 tab by mouth twice a day for 10 days 23)  Miralax Powd (Polyethylene glycol 3350) .... One capful dissolved in 8 ounces of fluid by mouth once daily as needed for constipation. disp: one month supply. refill: prn 24)  Risperidone 2 Mg Tabs (Risperidone) .... Sig 1/2 tab by mouth at  bedtime for 10 days then increase to one tab by mouth qhs 25)  Glucotrol 5 Mg Tabs (Glipizide) .... Take 1 pill daily  Patient Instructions: 1)  Please see me in 4-6 weeks for follow up of your Diabetes.  Prevention & Chronic Care Immunizations   Influenza vaccine: Fluvax 3+  (05/30/2008)   Influenza vaccine due: 05/30/2009    Tetanus booster: 05/30/2008: Tdap   Tetanus booster due: 05/30/2018    Pneumococcal vaccine: Done.  (05/31/2001)   Pneumococcal vaccine due: None    H. zoster vaccine: Not documented  Colorectal Screening   Hemoccult: not indicated  (07/03/2008)   Hemoccult due: Not Indicated    Colonoscopy: Adenomatous Polyp  (11/30/2002)   Colonoscopy due: 11/29/2012  Other Screening   Pap smear: has gyn provider  (07/04/2008)   Pap smear action/deferral: Not indicated-other  (05/08/2009)   Pap smear due: Not Indicated    Mammogram: BI-RADS CATEGORY 2:  Benign finding(s).^MM DIGITAL DIAGNOSTIC UNILAT L  (11/02/2008)   Mammogram due: 11/02/2009    DXA bone density scan: Not documented   Smoking status: never  (01/15/2010)  Diabetes Mellitus   HgbA1C: 7.7  (01/15/2010)   HgbA1C action/deferral: Ordered  (05/08/2009)   Hemoglobin A1C due: 12/15/2009    Eye exam: No diabetic retinopathy.     (10/21/2009)    Foot exam: yes  (07/16/2008)   Foot exam action/deferral: Do today   High risk foot: Not documented   Foot care education: Not documented   Foot exam due: 12/15/2009    Urine microalbumin/creatinine ratio: Not documented  Lipids   Total Cholesterol: 182  (09/26/2009)   Lipid panel action/deferral: Lipid Panel ordered   LDL: 116  (09/26/2009)   LDL Direct: 82  (07/10/2009)   HDL: 44  (09/26/2009)   Triglycerides: 109  (09/26/2009)   Lipid panel due: 12/15/2009    SGOT (AST): 12  (09/26/2009)   BMP action: Ordered   SGPT (ALT): 13  (09/26/2009)   Alkaline phosphatase: 87  (09/26/2009)   Total bilirubin: 0.3  (09/26/2009)   Liver panel due:  12/15/2009  Hypertension   Last Blood Pressure: 121 / 67  (03/19/2010)   Serum creatinine: 0.7  (02/13/2009)   Serum potassium 4.0  (02/13/2009)   Basic metabolic panel due: 12/15/2009  Self-Management Support :   Personal Goals (by the next clinic visit) :     Personal A1C goal: 7  (01/15/2010)     Personal blood pressure goal: 130/80  (09/16/2009)     Personal LDL goal: 70  (09/16/2009)    Diabetes self-management support: Written self-care plan  (09/25/2009)    Diabetes self-management support not done because: Good outcomes  (07/10/2009)    Hypertension self-management support: Written self-care plan  (09/25/2009)    Hypertension self-management support not done because:  Good outcomes  (09/16/2009)    Lipid self-management support: Written self-care plan  (09/25/2009)     Lipid self-management support not done because: Good outcomes  (07/10/2009)

## 2010-10-02 NOTE — Consult Note (Signed)
Summary: [facility]  [facility]   Imported By: Clydell Hakim 12/04/2009 11:25:34  _____________________________________________________________________  External Attachment:    Type:   Image     Comment:   External Document

## 2010-10-02 NOTE — Initial Assessments (Signed)
Summary: COPD Exacerbation   Primary Care Provider:  [name] MD  CC:  Cough and Dyspnea.  History of Present Illness: 81 YOF w/ PMHx/o COPD on home O2 @ 2LNC, CHF, CAD, HTN, DM, here w/ 8 day hx/o progressive cough and increased work of breathing. Pt states sxs began 8 days ago w/ cough which was initially non-productive. Pt does report having intermittent cough at baseline. Cough was unrelieved w/ home albuterol. Pt states cough became productive on day 3 of thick yellow sputum. Pt denies hemoptysis, vomiting, diarrhea, or constipation. Pt also reports subjective fevers and increased home O2 use above baseline. Pt also reports possible sick contacts. Denies tobacco use, quit >5 years ago. Pt states she only uses home O2 when symptomatic, which is 2-3x/ week @ 1-2L [place]. Pt reports using O2 throughout the day and evening over last several days w/ minimal improvement in sxs. At baseline pt only dyspneic w/ moderate exertion. Pt reports adequate po intake throughout symptomatology.Pt now minimally dyspneic at rest. Pt denies CP, chest heaviness, abd pain. In ED intitially w/ O2 sats in 80s on RA, upper 90s w/ 2LNC. Pt recieved solumedrol, albuterol/atrovent x1, and Amox/Azithromycin in ED. CXR in ED not indicative of PNA.   Problems Prior to Update: 1)  Chronic Obstructive Pulmonary Disease, Acute Exacerbation  (ICD-491.21) 2)  Back Pain, Chronic, Intermittent  (ICD-724.5) 3)  Oxygen-use of Supplemental  (ICD-V46.2) 4)  Allergic Rhinitis  (ICD-477.9) 5)  Breast Mass  (ICD-611.72) 6)  Diabetes Mellitus II, Uncomplicated  (ICD-250.00) 7)  Hypertension, Benign Systemic  (ICD-401.1) 8)  Breast Cancer  (ICD-174.9) 9)  COPD  (ICD-496) 10)  CHF - Ejection Fraction < 50%  (ICD-428.22) 11)  Bipolar Disorder  (ICD-296.7) 12)  Coronary, Arteriosclerosis  (ICD-414.00) 13)  Hypercholesterolemia  (ICD-272.0) 14)  Gastroesophageal Reflux, No Esophagitis  (ICD-530.81) 15)  Anemia Due To Dietary Iron  Deficiency  (ICD-280.1) 16)  Panic Attacks  (ICD-300.01) 17)  Counseling Marital&partner Problems Unspecified  (ICD-V61.10)  Medications Prior to Update: 1)  Advair Diskus 500-50 Mcg/dose  Misc (Fluticasone-Salmeterol) .... One Puff Bid 2)  Albuterol 90 Mcg/act Aers (Albuterol) .... Inhale 2 Puff Using Inhaler Every Six Hours(Patient Prefers Not Hfa Propellant) 3)  Cartia Xt 180 Mg Cp24 (Diltiazem Hcl Coated Beads) .... Take 1 Capsule By Mouth Once A Day 4)  Flonase 50 Mcg/act Susp (Fluticasone Propionate) .... On Hold As Needed Use 5)  Glucophage 1000 Mg Tabs (Metformin Hcl) .... Take 1 Tablet By Mouth Twice A Day 6)  Klor-Con 10 10 Meq Tbcr (Potassium Chloride) .... Take 1 Tablet By Mouth Twice A Day 7)  Lasix 20 Mg Tabs (Furosemide) .... Take 1 Tablet By Mouth Twice A Day 8)  Lipitor 80 Mg Tabs (Atorvastatin Calcium) .... Take 1 Tablet By Mouth At Bedtime 9)  Lotensin 5 Mg Tabs (Benazepril Hcl) .... Take 1 Tablet By Mouth Single Dose 10)  Plavix 75 Mg Tabs (Clopidogrel Bisulfate) .Marland Kitchen.. 1 Tablet By Mouth Once A Day 11)  Ativan 2 Mg  Tabs (Lorazepam) .... Three Times A Day 12)  Neurontin 300 Mg  Caps (Gabapentin) .... Two Times A Day 13)  Albuterol Sulfate (2.5 Mg/85ml) 0.083%  Nebu (Albuterol Sulfate) .... As Directed Up To One Vial Qid Per Nebulizer 14)  Theophylline Cr 300 Mg  Tb12 (Theophylline) .Marland Kitchen.. 1 By Mouth Two Times A Day 15)  Atrovent Hfa 17 Mcg/act Aers (Ipratropium Bromide Hfa) .... 4 Puffs Every 2 Hours As Needed For Shortness of Breath 16)  Allegra-D  12 Hour 60-120 Mg Xr12h-Tab (Fexofenadine-Pseudoephedrine) .... Two Times A Day Prn 17)  Omeprazole 40 Mg Cpdr (Omeprazole) .Marland Kitchen.. 1 By Mouth Qd 18)  Ipratropium-Albuterol 0.5-2.5 (3) Mg/39ml Soln (Ipratropium-Albuterol) .Marland Kitchen.. 1 Treatment Every 4 Hours Scheduled and Every 2 Hours As Needed For Coughing.  Take For The Next 5 Days 19)  Ferrous Sulfate 324 Mg Tabs (Ferrous Sulfate) .Marland Kitchen.. 1 By Mouth Three Times A Day 20)  Vicodin 5-500 Mg  Tabs (Hydrocodone-Acetaminophen) .Marland Kitchen.. 1 By Mouth Two Times A Day Prn 21)  Aspirin 81 Mg Tbec (Aspirin) .... Take 1 Tablet By Mouth Once A Day 22)  Doxycycline Hyclate 100 Mg Tabs (Doxycycline Hyclate) .... Take 1 Tab By Mouth Twice A Day For 10 Days 23)  Miralax  Powd (Polyethylene Glycol 3350) .... One Capful Dissolved in 8 Ounces of Fluid By Mouth Once Daily As Needed For Constipation. Disp: One Month Supply. Refill: Prn 24)  Risperidone 2 Mg Tabs (Risperidone) .... Sig 1/2 Tab By Mouth At Bedtime For 10 Days Then Increase To One Tab By Mouth Qhs  Allergies: 1)  ! Aspirin 2)  Codeine  Past History:  Past Medical History: Last updated: 04/01/2007 L Breast CA--StageII mulitifoc infiltrating ductal, lgsil 4/06--dr. Jerolyn Center (leep), Lumpectomy (1 out of 12 nodes positive (2mm focus), opth-- bernstorf ([address]), Arimidex (1mg  q d) therapy for 5+ years (heme/onc),  s/p chemo with cytoxan and adriamycin 2003,  s/p radiation for breast CA (magrinot/young) 2003  quit smoking 12/2003,  Diverticulosis (dx by colonoscopy 3/04), repeat colonoscopy w EGD and camera capsule 2008 (normal)  h/o postmenopausal bleeding,  hospitalized 11/2003 VDRF, pseudomonas pneumonia, hospitalized 01/2003 community-acquired pneumonia,   Past Surgical History: Last updated: 04/01/2007  12/27/2003, lumpectomy with sentinel node bx   ABG:  7.34/pCO2 59/pO66/ - 06/01/2003, baseline O2 sat 92% at rest; 83% c ambulation - 10/19/2000, Baseline Peak Flows 270 - 09/24/2003, c/w severe obstruction, low vital capacity -   09/24/2003, Cardiac Cath:  critical lesions x2  RCA - 12/14/2003, Cardiolite: EF 56%, 4.6 mets - 05/01/2004, Cardiolite--neg, inferapical scar, ef 60% - 05/26/2006, s/p angioplasty and stenting - 12/14/2003  Colonoscopy-2004 w repeat 2008. EGD 2008  iv iron per heme - 06/23/2006,  -- 11/29/2001,  Nerve Conduction:  moderate B carpal tunnel syndrome - 05/21/2003, , Spirometry:  FVC 57%, FEV1 26% - 09/24/2003, VDRF  requiring intubation (first time) - 12/20/2003  Family History: Last updated: 10/28/2006 leukemia, asthma, allergies, DM, poor circulation,, lung CA, father - gastric CA  Social History: Last updated: 04/01/2007 [place]; married to husband [name]); smoked 1 ppd--- quit smoking 12/2003,  no etoh, drugs; disabled due to COPD;  worked as Agricultural engineer; 10th grade educationout to get married.  Daughter lives in [place] and son Jonny Ruiz) lives outside of [place].  Total of 4 granddaughters.  Risk Factors: Exercise: no (11/30/2006)  Risk Factors: Smoking Status: quit (07/10/2009)  Family History: Reviewed history from 10/28/2006 and no changes required. leukemia, asthma, allergies, DM, poor circulation,, lung CA, father - gastric CA  Social History: Reviewed history from 04/01/2007 and no changes required. 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  Daughter lives in [place] and son Jonny Ruiz) lives outside of [place].  Total of 4 granddaughters.  Review of Systems CV:  Complains of shortness of breath with exertion. Resp:  Complains of shortness of breath, sputum productive, and wheezing. Neuro:  weakness/dizzines w/ standing.  Physical Exam  General:  alert and overweight-appearing.   Head:  normocephalic and atraumatic.   Eyes:  vision grossly intact, pupils equal, pupils round, and pupils reactive to light.   Ears:  R ear normal and L ear normal.   Nose:  no external deformity.   Mouth:   oropharynx pink and moist and no erythema or post exudates Neck:  supple and full ROM, no cervical LAD Lungs:  Diffuse expiratory wheezes throughout, most prominent in apices, prolonged cough w/ deep inspiration.  Heart:  RRR, no rubs, gallops, murmurs auscultated Abdomen:  soft, non-tender, normal bowel sounds, and no distention.     Neurologic:  alert & oriented X3, cranial nerves II-XII intact, strength normal in all extremities, sensation intact to light touch, and sensation intact to pinprick.   Skin:  turgor normal and color normal.   Psych:  Oriented X3 and memory intact for recent and remote.   Additional Exam:  Labs and Studies: CBC: 10/6>12.0/35.2<279 CMET: 137/3.8/99/28/11/0.74/Glu 173, TB 0.2, ALP 81, AST 18, ALT 13, TP 7.5, Ca 9.6, Alb 3.7 CK 106, CKMB 4.1, Relative INdex 3.9, Trop I 0.03  UA: urine glucose @ 100, otherwise WNL CXR: linear atelectasis at the right lung base, no acute findings, question emphysema with pulmonary arterial HTN   Impression & Recommendations:  Problem # 1:  Dyspnea/Cough Likely COPD exacerbation vs PNA in setting of clinical exam and radiological more consistent w/ COPD exacerbation. Will start pt on prednisone 60mg  by mouth once daily in addition to doxycycline 100mg  by mouth two times a day, albuterol/atrovent nebs as needed. We will cont home COPD medications which include advair 500/50, home albuterol IH. Will also obtain BNP, given pts hx/o CHF to evaluate for myocardial strain during current symptomatology, though pt asymptomatic. Will also obtain sputum cxs.   Problem # 2:  dizziness Pt reports intermittent dizziness over last 8 days consistent with orthostatic hypotension. Pt w/ SBP in 80s on admission.  While pt reports adequate po intake, clinical exam indicative of mild hypovolemia. Likely secondary to poor by mouth intake during symptomatology. WIll obtain orthostatic BPs x 2. Will also rehydrate pt w/ NS @ 75 ccs/hr x 8 hours, then [employer] in setting of CHF.   Problem # 3:  CHF - EJECTION FRACTION < 50% (ICD-428.22) BNP pending to evaluate heart strain. Currently asymptomaticWill continue plavix.   Problem # 4:  HYPERTENSION, BENIGN SYSTEMIC (ICD-401.1) Will hold diiuretic and ACEi in setting of hypotension. WIll decrease diltiazem to 1/2 home dose.   Problem # 5:   CORONARY, ARTERIOSCLEROSIS (ICD-414.00) Cont home plavix, lipitor  Problem # 6:  HYPERCHOLESTEROLEMIA (ICD-272.0) Cont lipitor  Problem # 7:  Peripheral neuropathy Cont neurontin  Problem # 8:  Psych Cont home ativan as needed agitation, ativan  Problem # 9:  DIABETES MELLITUS II, UNCOMPLICATED (ICD-250.00) Will hold metformin. Will place pt on sensitive SSI.   Problem # 10:  Anemeia Hgb stable. Cont ferrous sulfate.   Problem # 11:  Hx/o breast ca Stable  Problem # 12:  FEN/GI NS@ 75ccs/hr X8 hrs, then NS @ KVO. CHO mod diet. BMET in am to evaluate renal function and electrolytes. Cont by mouth potassium.   Dispo: Full code, Pending further evaluation.   Complete Medication List: 1)  Advair Diskus 500-50 Mcg/dose Misc (Fluticasone-salmeterol) .... One puff bid 2)  Albuterol 90 Mcg/act Aers (Albuterol) .... Inhale 2 puff using inhaler every six hours(patient prefers not hfa propellant) 3)  Cartia Xt  180 Mg Cp24 (Diltiazem hcl coated beads) .... Take 1 capsule by mouth once a day 4)  Flonase 50 Mcg/act Susp (Fluticasone propionate) .... On hold as needed use 5)  Glucophage 1000 Mg Tabs (Metformin hcl) .... Take 1 tablet by mouth twice a day 6)  Klor-con 10 10 Meq Tbcr (Potassium chloride) .... Take 1 tablet by mouth twice a day 7)  Lasix 20 Mg Tabs (Furosemide) .... Take 1 tablet by mouth twice a day 8)  Lipitor 80 Mg Tabs (Atorvastatin calcium) .... Take 1 tablet by mouth at bedtime 9)  Lotensin 5 Mg Tabs (Benazepril hcl) .... Take 1 tablet by mouth single dose 10)  Plavix 75 Mg Tabs (Clopidogrel bisulfate) .Marland Kitchen.. 1 tablet by mouth once a day 11)  Ativan 2 Mg Tabs (Lorazepam) .... Three times a day 12)  Neurontin 300 Mg Caps (Gabapentin) .... Two times a day 13)  Albuterol Sulfate (2.5 Mg/58ml) 0.083% Nebu (Albuterol sulfate) .... As directed up to one vial qid per nebulizer 14)  Theophylline Cr 300 Mg Tb12 (Theophylline) .Marland Kitchen.. 1 by mouth two times a day 15)  Atrovent Hfa 17  Mcg/act Aers (Ipratropium bromide hfa) .... 4 puffs every 2 hours as needed for shortness of breath 16)  Allegra-d 12 Hour 60-120 Mg Xr12h-tab (Fexofenadine-pseudoephedrine) .... Two times a day prn 17)  Omeprazole 40 Mg Cpdr (Omeprazole) .Marland Kitchen.. 1 by mouth qd 18)  Ipratropium-albuterol 0.5-2.5 (3) Mg/68ml Soln (Ipratropium-albuterol) .Marland Kitchen.. 1 treatment every 4 hours scheduled and every 2 hours as needed for coughing.  take for the next 5 days 19)  Ferrous Sulfate 324 Mg Tabs (Ferrous sulfate) .Marland Kitchen.. 1 by mouth three times a day 20)  Vicodin 5-500 Mg Tabs (Hydrocodone-acetaminophen) .Marland Kitchen.. 1 by mouth two times a day prn 21)  Aspirin 81 Mg Tbec (Aspirin) .... Take 1 tablet by mouth once a day 22)  Doxycycline Hyclate 100 Mg Tabs (Doxycycline hyclate) .... Take 1 tab by mouth twice a day for 10 days 23)  Miralax Powd (Polyethylene glycol 3350) .... One capful dissolved in 8 ounces of fluid by mouth once daily as needed for constipation. disp: one month supply. refill: prn 24)  Risperidone 2 Mg Tabs (Risperidone) .... Sig 1/2 tab by mouth at bedtime for 10 days then increase to one tab by mouth qhs  Appended Document: COPD Exacerbation Agree with excellent H+P above by PGY 1  Briefly this is a 60F PMH COPD p/w increasing yellow sputum, worsening cough, dyspnea x 1 week. Reports subjective fevers, dizziness when stands quickly, sick contacts with URI symptoms, decreased appetite. Denies hemoptysis, emesis, diarrhea, unilateral weakness, chest pain. On home O2 2L but uses intermittently, has dyspnea w/ moderate exertion at baseline. Cough unrelieved by home albuterol. Has been taking medications as prescribed.   See PGY1 note for PMH, PSH, Meds, Allergies, FH, SH, ROS, vitals and labs  General: obese, NAD, talking on cell phone without difficulty.  Eyes: EOMI, PERRL, no conjunctivitis or drainage Nose: no rhinorrhea Mouth: moist mucus membranes, no erythema or exudate  Neck: no JVD or carotid bruits,  supple CV: RRR, no murmurs Pulm: diffuse expiratory wheeze, normal work of breathing, able to complete sentences Abd: obese, soft, NT/ND, +BS Ext: no cyanosis Neuro: CN II-XII inatct, sensation intact, motor intact throughout.   CXR: linear atelectasis at the right lung base, no acute findings, question emphysema with pulmonary arterial HTN  60F w/ likely COPD exacerbation  1) COPD exacerbation: History, exam and CXR c/w diagnosis. Will start doxycycline, prednisone, albuterol/atrovent  nebs, continue home medications as above. Will check BNP w/ h/o CHF but not clinically volume overloaded on exam or film.   2) Dizziness: Neuro intact, no vertigo. Symptoms c/w orthostasis, likely secondary to decreased oral intake and illness. Will hold ACE, Lasix w/ systolics in 80's. Diltiazem at half dose. NS @ 75 ml/hr.  3) HTN: Holding meds as above with systolics in 80's. Will follow.   4) CAD: Stable. Plavix, lipitor.   5) Peripheral neuropathy: Stable. Neurontin  6) Anxiety: Stable. Continue ativan.   7) DM2: Will hold metformin for now. Start SSI. Carb mod diet.   8) Code statuts: Full code.  9) PPX: Heparin three times a day  10) Dispo: Pending clinical imrovement. Likely d/c on 1/2  Signed by Bobby Rumpf on 08/31/2009 at 10:30 PM Bobby Rumpf [phone]

## 2010-10-02 NOTE — Progress Notes (Signed)
Summary: changed to advair 250 per pt request  Phone Note Call from Patient Call back at Home Phone [phone]   Caller: Patient Summary of Call: Pt having a problem with the strength of her Advair. Initial call taken by: Clydell Hakim,  April 07, 2010 10:01 AM  Follow-up for Phone Call        according to patient she needs Advair 250. states the 500 is too much for her. according to our records patient has been on the 500/50 since 2009. called pharmacy and was told rx for advair 250 had gotten cancelled in Oct of 2010.  pharmacist states patient picked up the adviar 500 in June.  Pharmacist states maybe  she may have called in the number off an old bottle and it got filled that way with the 250 at some point.  patient states she has picked up the advair 500 mg twice and has given to someone else because she cannot use it. since Dr. [name] is not here this week will ask Dr. Swaziland 's advice . she is preceptor today. wil lask if one device for the 250 can be sent in until Dr. [name] returns and  decides what she wants patient to do.  [facility] [address] Follow-up by: Theresia Lo RN,  April 07, 2010 11:19 AM    New/Updated Medications: ADVAIR DISKUS 250-50 MCG/DOSE AEPB (FLUTICASONE-SALMETEROL) 1 puff BID Prescriptions: ADVAIR DISKUS 250-50 MCG/DOSE AEPB (FLUTICASONE-SALMETEROL) 1 puff BID  #1 x 0   Entered and Authorized by:   Sarah Swaziland MD   Signed by:   Sarah Swaziland MD on 04/07/2010   Method used:   Electronically to        [facility]  [id number]       [address]       [place], [place]  [address]       Ph: [phone] or [phone]       Fax: [phone]   RxID:   [id number]

## 2010-10-02 NOTE — Progress Notes (Signed)
Summary: refill  Phone Note Refill Request Call back at Home Phone [phone] Message from:  Patient  Refills Requested: Medication #1:  ATIVAN 2 MG  TABS three times a day Initial call taken by: De Nurse,  August 04, 2010 9:22 AM  Follow-up for Phone Call        spoke with pt and she has already picked up Follow-up by: Jimmy Footman, CMA,  August 04, 2010 4:36 PM    Prescriptions: ATIVAN 2 MG  TABS (LORAZEPAM) three times a day  #90 x 5   Entered and Authorized by:   [name] MD   Signed by:   [name] MD on 08/04/2010   Method used:   Telephoned to ...       [facility]  [id number]       [address]       [place], [place]  [address]       Ph: [phone] or [phone]       Fax: [phone]   RxID:   [id number]  DWT please call in as above Thanks!  [name] MD  August 04, 2010 9:30 AM

## 2010-10-02 NOTE — Assessment & Plan Note (Signed)
Summary: congestion/cold symptoms/eo  pt left without being seen, told pt Dr Georgiana Shore was ready to see her now but she declined. pt arrived 30 min early for her appt was the reason for the wait.Molly Maduro [facility] CMA  November 15, 2009 11:57 AM    Vital Signs:  Patient profile:   60 year old female Height:      63.25 inches Weight:      171 pounds BMI:     30.16 Temp:     98.0 degrees F oral Pulse rate:   88 / minute BP sitting:   127 / 77  (right arm) Cuff size:   regular  Vitals Entered By: Tessie Fass CMA (November 15, 2009 11:14 AM) CC: heart racing, weak and left leg pain Is Patient Diabetic? Yes Pain Assessment Patient in pain? yes     Location: left leg Intensity: 5   CC:  heart racing and weak and left leg pain.  Habits & Providers  Alcohol-Tobacco-Diet     Tobacco Status: quit  Allergies: 1)  ! Aspirin 2)  Codeine  Social History: Smoking Status:  quit   Complete Medication List: 1)  Advair Diskus 500-50 Mcg/dose Misc (Fluticasone-salmeterol) .... One puff bid 2)  Albuterol 90 Mcg/act Aers (Albuterol) .... Inhale 2 puff using inhaler every six hours(patient prefers not hfa propellant) 3)  Cartia Xt 180 Mg Cp24 (Diltiazem hcl coated beads) .... Take 1 capsule by mouth once a day 4)  Flonase 50 Mcg/act Susp (Fluticasone propionate) .... On hold as needed use 5)  Glucophage 1000 Mg Tabs (Metformin hcl) .... Take 1 tablet by mouth twice a day 6)  Klor-con 10 10 Meq Tbcr (Potassium chloride) .... Take 1 tablet by mouth twice a day 7)  Lasix 20 Mg Tabs (Furosemide) .... Take 1 tablet by mouth twice a day 8)  Lipitor 80 Mg Tabs (Atorvastatin calcium) .... Take 1 tablet by mouth at bedtime 9)  Lotensin 5 Mg Tabs (Benazepril hcl) .... Take 1 tablet by mouth single dose 10)  Plavix 75 Mg Tabs (Clopidogrel bisulfate) .Marland Kitchen.. 1 tablet by mouth once a day 11)  Ativan 2 Mg Tabs (Lorazepam) .... Three times a day 12)  Neurontin 300 Mg Caps (Gabapentin) .... Two times a day 13)   Albuterol Sulfate (2.5 Mg/40ml) 0.083% Nebu (Albuterol sulfate) .... As directed up to one vial qid per nebulizer 14)  Theophylline Cr 300 Mg Tb12 (Theophylline) .Marland Kitchen.. 1 by mouth two times a day 15)  Atrovent Hfa 17 Mcg/act Aers (Ipratropium bromide hfa) .... 4 puffs every 2 hours as needed for shortness of breath 16)  Allegra-d 12 Hour 60-120 Mg Xr12h-tab (Fexofenadine-pseudoephedrine) .... Two times a day prn 17)  Omeprazole 40 Mg Cpdr (Omeprazole) .Marland Kitchen.. 1 by mouth qd 18)  Ipratropium-albuterol 0.5-2.5 (3) Mg/46ml Soln (Ipratropium-albuterol) .Marland Kitchen.. 1 treatment every 4 hours scheduled and every 2 hours as needed for coughing.  take for the next 5 days 19)  Ferrous Sulfate 324 Mg Tabs (Ferrous sulfate) .Marland Kitchen.. 1 by mouth three times a day 20)  Vicodin 5-500 Mg Tabs (Hydrocodone-acetaminophen) .Marland Kitchen.. 1 by mouth two times a day prn 21)  Aspirin 81 Mg Tbec (Aspirin) .... Take 1 tablet by mouth once a day 22)  Doxycycline Hyclate 100 Mg Tabs (Doxycycline hyclate) .... Take 1 tab by mouth twice a day for 10 days 23)  Miralax Powd (Polyethylene glycol 3350) .... One capful dissolved in 8 ounces of fluid by mouth once daily as needed for constipation. disp: one month supply. refill:  prn 24)  Risperidone 2 Mg Tabs (Risperidone) .... Sig 1/2 tab by mouth at bedtime for 10 days then increase to one tab by mouth qhs 25)  Glucotrol 5 Mg Tabs (Glipizide) .... Take 1 pill daily  Other Orders: No Charge Patient Arrived (NCPA0) (NCPA0)

## 2010-10-02 NOTE — Letter (Addendum)
Summary: Lipid Letter  [facility]  [address]   [place], [place] [address]   Phone: [phone]  Fax: [phone]    09/09/2010  Maria Gaines [address] [place], [place]  [address]  Dear Ms. Maria Gaines:  We have carefully reviewed your last lipid profile from 09/26/2009 and the results are noted below with a summary of recommendations for lipid management.    Cholesterol:       182     Goal: <   HDL "good" Cholesterol:   44     Goal: >   LDL "bad" Cholesterol:   92     Goal: <   Triglycerides:       109     Goal: <        TLC Diet (Therapeutic Lifestyle Change): Saturated Fats & Transfatty acids should be kept < 7% of total calories ***Reduce Saturated Fats Polyunstaurated Fat can be up to 10% of total calories Monounsaturated Fat Fat can be up to 20% of total calories Total Fat should be no greater than 25-35% of total calories Carbohydrates should be 50-60% of total calories Protein should be approximately 15% of total calories Fiber should be at least 20-30 grams a day ***Increased fiber may help lower LDL Total Cholesterol should be < 200mg /day Consider adding plant stanol/sterols to diet (example: Benacol spread) ***A higher intake of unsaturated fat may reduce Triglycerides and Increase HDL    Adjunctive Measures (may lower LIPIDS and reduce risk of Heart Attack) include: Aerobic Exercise (20-30 minutes 3-4 times a week) Limit Alcohol Consumption Weight Reduction Aspirin 75-81 mg a day by mouth (if not allergic or contraindicated) Dietary Fiber 20-30 grams a day by mouth     Current Medications: 1)    Albuterol 90 Mcg/act Aers (Albuterol) .... Inhale 2 puff using inhaler every six hours(patient prefers not hfa propellant) 2)    Cartia Xt 180 Mg Cp24 (Diltiazem hcl coated beads) .... Take 1 capsule by mouth once a day 3)    Flonase 50 Mcg/act Susp (Fluticasone propionate) .... On hold as needed use 4)    Glucophage 1000 Mg Tabs (Metformin  hcl) .... Take 1 tablet by mouth twice a day 5)    Klor-con 10 10 Meq Tbcr (Potassium chloride) .... Take 1 tablet by mouth twice a day 6)    Lasix 20 Mg Tabs (Furosemide) .... Take 1 tablet by mouth twice a day 7)    Lipitor 80 Mg Tabs (Atorvastatin calcium) .... Take 1 tablet by mouth at bedtime 8)    Benazepril Hcl 5 Mg Tabs (Benazepril hcl) .... One by mouth daily 9)    Plavix 75 Mg Tabs (Clopidogrel bisulfate) .Marland Kitchen.. 1 tablet by mouth once a day 10)    Ativan 2 Mg  Tabs (Lorazepam) .... Three times a day 11)    Neurontin 300 Mg  Caps (Gabapentin) .... Two times a day 12)    Albuterol Sulfate (2.5 Mg/27ml) 0.083%  Nebu (Albuterol sulfate) .... As directed up to one vial qid per nebulizer 13)    Theophylline Cr 300 Mg  Tb12 (Theophylline) .Marland Kitchen.. 1 by mouth two times a day 14)    Atrovent Hfa 17 Mcg/act Aers (Ipratropium bromide hfa) .... 4 puffs every 2 hours as needed for shortness of breath 15)    Omeprazole 40 Mg Cpdr (Omeprazole) .Marland Kitchen.. 1 by mouth qd 16)    Ipratropium-albuterol 0.5-2.5 (3) Mg/64ml Soln (Ipratropium-albuterol) .Marland Kitchen.. 1 treatment every 4 hours scheduled and every 2  hours as needed for coughing.  take for the next 5 days 17)    Aspirin 81 Mg Tbec (Aspirin) .... Take 1 tablet by mouth once a day 18)    Risperidone 2 Mg Tabs (Risperidone) .... Sig 1/2 tab by mouth at bedtime for 10 days then increase to one tab by mouth qhs 19)    Glucotrol 5 Mg Tabs (Glipizide) .... Take 1 pill daily 20)    Advair Diskus 250-50 Mcg/dose Aepb (Fluticasone-salmeterol) .Marland Kitchen.. 1 puff bid 21)    Ferro-bob 325 (65 Fe) Mg Tabs (Ferrous sulfate) .Marland Kitchen.. 1 by mouth three times a day  If you have any questions, please call. We appreciate being able to work with you.   Sincerely,    [facility] [name] MD  Appended Document: Lipid Letter mailed

## 2010-10-02 NOTE — Letter (Signed)
Summary: [facility]  [facility]   Imported By: Sherian Rein 12/30/2009 08:17:35  _____________________________________________________________________  External Attachment:    Type:   Image     Comment:   External Document

## 2010-10-02 NOTE — Consult Note (Signed)
Summary: [facility]   Imported By: De Nurse 08/21/2010 17:02:05  _____________________________________________________________________  External Attachment:    Type:   Image     Comment:   External Document

## 2010-10-02 NOTE — Progress Notes (Signed)
Summary: phn msg  Phone Note Call from Patient Call back at Home Phone [phone]   Caller: Patient Summary of Call: has DM and her urine smells really bad- wants to know what she can do about it has appt w/ [name] on 11/30 Initial call taken by: De Nurse,  July 16, 2010 8:42 AM  Follow-up for Phone Call        patient reports back pain, having to strain to urinate, urine has strong odor. appointment scheduled today. Follow-up by: Theresia Lo RN,  July 16, 2010 9:22 AM

## 2010-10-02 NOTE — Progress Notes (Signed)
Summary: Dizzy  Phone Note Call from Patient   Summary of Call: Feeling dizzy, feels like she is drunk. Started gradually about 1 week ago, now worsening. Denies chest pain, dyspnea, unilateral weakness. Reports neck pain. Advised to come in to ER for further evaluation.  Initial call taken by: Bobby Rumpf  MD,  August 31, 2009 10:49 AM

## 2010-10-02 NOTE — Progress Notes (Signed)
Summary: Triage  Phone Note Call from Patient Call back at Home Phone [phone]   Reason for Call: Talk to Nurse Summary of Call: pt has a question, sts she has heart problems Initial call taken by: Knox Royalty,  December 05, 2009 10:39 AM  Follow-up for Phone Call        has been seeing Dr Allyson Sabal, her cardiac md. had an ultrasound yesterday. got a RX for SL NTG.  states she just wanted to talk with me. her husband is not being supportive per pt & she is stressed. told her I will forward this note to her pcp  Follow-up by: Golden Circle RN,  December 05, 2009 10:45 AM

## 2010-10-02 NOTE — Progress Notes (Signed)
Summary: Medication question  Phone Note Call from Patient Call back at Home Phone [phone]   Reason for Call: Talk to Nurse Complaint: Cough/Sore throat Summary of Call: heard on tv acetamenophen is not good to take Initial call taken by: Knox Royalty,  February 10, 2010 2:56 PM  Follow-up for Phone Call        will forward to MD to ask if she knows of any reason patient can't take acetamenophen. Follow-up by: Theresia Lo RN,  February 11, 2010 8:44 AM  Additional Follow-up for Phone Call Additional follow up Details #1::        as long as she takes lee than 4000 mg (that is 8 500 mg t abs) she is OK Thanks!  [name] MD  February 11, 2010 9:57 AM     Additional Follow-up for Phone Call Additional follow up Details #2::    Pt notified Follow-up by: Gladstone Pih,  February 11, 2010 4:50 PM

## 2010-10-02 NOTE — Assessment & Plan Note (Signed)
Summary: palpitations/kf   Vital Signs:  Patient profile:   60 year old female Height:      63.25 inches Weight:      177 pounds BMI:     31.22 Temp:     98.4 degrees F oral Pulse rate:   103 / minute BP sitting:   125 / 76  (right arm) Cuff size:   large  Vitals Entered By: Garen Grams LPN (June 26, 2010 11:29 AM) CC: palpitations Is Patient Diabetic? Yes Did you bring your meter with you today? No   Primary Care Provider:  [name] MD  CC:  palpitations.  History of Present Illness: 60 yo here today for work- in appt for palpitations.    This am had acute onset of "heart beating fast" with SOB.  She called EMS and brings in an EKG they did while there.  Denies chest pain, nausea, abd pain, irregular heartbeat.  Upon further questioning she does feel it was similar to previous panic attacks and she and her husband had a heated argument last night before she went to bed.  Episode self-resolved.  Did take her as needed ativan.  Has nto taken any of her BP medicines.  Chart review reveals, normal cardiac stress test several months ago, hx of recurrent complaint of palpitations, multiple life stressors.  Habits & Providers  Alcohol-Tobacco-Diet     Tobacco Status: never     Tobacco Counseling: not indicated; no tobacco use     Year Quit: 5 YEARS AGO  Current Medications (verified): 1)  Albuterol 90 Mcg/act Aers (Albuterol) .... Inhale 2 Puff Using Inhaler Every Six Hours(Patient Prefers Not Hfa Propellant) 2)  Cartia Xt 180 Mg Cp24 (Diltiazem Hcl Coated Beads) .... Take 1 Capsule By Mouth Once A Day 3)  Flonase 50 Mcg/act Susp (Fluticasone Propionate) .... On Hold As Needed Use 4)  Glucophage 1000 Mg Tabs (Metformin Hcl) .... Take 1 Tablet By Mouth Twice A Day 5)  Klor-Con 10 10 Meq Tbcr (Potassium Chloride) .... Take 1 Tablet By Mouth Twice A Day 6)  Lasix 20 Mg Tabs (Furosemide) .... Take 1 Tablet By Mouth Twice A Day 7)  Lipitor 80 Mg Tabs (Atorvastatin Calcium) ....  Take 1 Tablet By Mouth At Bedtime 8)  Benazepril Hcl 5 Mg Tabs (Benazepril Hcl) .... One By Mouth Daily 9)  Plavix 75 Mg Tabs (Clopidogrel Bisulfate) .Marland Kitchen.. 1 Tablet By Mouth Once A Day 10)  Ativan 2 Mg  Tabs (Lorazepam) .... Three Times A Day 11)  Neurontin 300 Mg  Caps (Gabapentin) .... Two Times A Day 12)  Albuterol Sulfate (2.5 Mg/71ml) 0.083%  Nebu (Albuterol Sulfate) .... As Directed Up To One Vial Qid Per Nebulizer 13)  Theophylline Cr 300 Mg  Tb12 (Theophylline) .Marland Kitchen.. 1 By Mouth Two Times A Day 14)  Atrovent Hfa 17 Mcg/act Aers (Ipratropium Bromide Hfa) .... 4 Puffs Every 2 Hours As Needed For Shortness of Breath 15)  Allegra-D 12 Hour 60-120 Mg Xr12h-Tab (Fexofenadine-Pseudoephedrine) .... Two Times A Day Prn 16)  Omeprazole 40 Mg Cpdr (Omeprazole) .Marland Kitchen.. 1 By Mouth Qd 17)  Ipratropium-Albuterol 0.5-2.5 (3) Mg/41ml Soln (Ipratropium-Albuterol) .Marland Kitchen.. 1 Treatment Every 4 Hours Scheduled and Every 2 Hours As Needed For Coughing.  Take For The Next 5 Days 18)  Aspirin 81 Mg Tbec (Aspirin) .... Take 1 Tablet By Mouth Once A Day 19)  Miralax  Powd (Polyethylene Glycol 3350) .... One Capful Dissolved in 8 Ounces of Fluid By Mouth Once Daily As Needed For  Constipation. Disp: One Month Supply. Refill: Prn 20)  Risperidone 2 Mg Tabs (Risperidone) .... Sig 1/2 Tab By Mouth At Bedtime For 10 Days Then Increase To One Tab By Mouth Qhs 21)  Glucotrol 5 Mg Tabs (Glipizide) .... Take 1 Pill Daily 22)  Advair Diskus 250-50 Mcg/dose Aepb (Fluticasone-Salmeterol) .Marland Kitchen.. 1 Puff Bid  Allergies: 1)  ! Aspirin 2)  Codeine 3)  * Ambien PMH-FH-SH reviewed for relevance  Review of Systems      See HPI  Physical Exam  General:  alert, well-developed, well-nourished, and well-hydrated.  Vitals reviewed- tachycardic Lungs:  normal respiratory effort and normal breath sounds.   Heart:  normal rate and regular rhythm.   Additional Exam:  EKG 10/27 9:59 AM HR 81, NSR   Impression & Recommendations:  Problem # 1:   PANIC ATTACKS (ICD-300.01)  episode consistant with panic attack.  Symptoms currenlty resolved.  Discussed breathing exercises with patient, reassurance that her EKG and recent stress tests were normal.  Tachycardic- advised resuming her cardizem once she gets home.  Advised follow-up with PCP.  Her updated medication list for this problem includes:    Ativan 2 Mg Tabs (Lorazepam) .Marland Kitchen... Three times a day  Orders: [facility]- Est Level  3 (60454)  Problem # 2:  COUNSELING MARITAL&PARTNER PROBLEMS UNSPECIFIED (ICD-V61.10)  appears to have contributed to today's panic attack.  Verbal argument, no physical violence.  Patient very angry.  No SI, HI.  Denies any intention to harm.  Orders: [facility]- Est Level  3 (09811)  Complete Medication List: 1)  Albuterol 90 Mcg/act Aers (Albuterol) .... Inhale 2 puff using inhaler every six hours(patient prefers not hfa propellant) 2)  Cartia Xt 180 Mg Cp24 (Diltiazem hcl coated beads) .... Take 1 capsule by mouth once a day 3)  Flonase 50 Mcg/act Susp (Fluticasone propionate) .... On hold as needed use 4)  Glucophage 1000 Mg Tabs (Metformin hcl) .... Take 1 tablet by mouth twice a day 5)  Klor-con 10 10 Meq Tbcr (Potassium chloride) .... Take 1 tablet by mouth twice a day 6)  Lasix 20 Mg Tabs (Furosemide) .... Take 1 tablet by mouth twice a day 7)  Lipitor 80 Mg Tabs (Atorvastatin calcium) .... Take 1 tablet by mouth at bedtime 8)  Benazepril Hcl 5 Mg Tabs (Benazepril hcl) .... One by mouth daily 9)  Plavix 75 Mg Tabs (Clopidogrel bisulfate) .Marland Kitchen.. 1 tablet by mouth once a day 10)  Ativan 2 Mg Tabs (Lorazepam) .... Three times a day 11)  Neurontin 300 Mg Caps (Gabapentin) .... Two times a day 12)  Albuterol Sulfate (2.5 Mg/32ml) 0.083% Nebu (Albuterol sulfate) .... As directed up to one vial qid per nebulizer 13)  Theophylline Cr 300 Mg Tb12 (Theophylline) .Marland Kitchen.. 1 by mouth two times a day 14)  Atrovent Hfa 17 Mcg/act Aers (Ipratropium bromide hfa) .... 4 puffs every  2 hours as needed for shortness of breath 15)  Allegra-d 12 Hour 60-120 Mg Xr12h-tab (Fexofenadine-pseudoephedrine) .... Two times a day prn 16)  Omeprazole 40 Mg Cpdr (Omeprazole) .Marland Kitchen.. 1 by mouth qd 17)  Ipratropium-albuterol 0.5-2.5 (3) Mg/39ml Soln (Ipratropium-albuterol) .Marland Kitchen.. 1 treatment every 4 hours scheduled and every 2 hours as needed for coughing.  take for the next 5 days 18)  Aspirin 81 Mg Tbec (Aspirin) .... Take 1 tablet by mouth once a day 19)  Miralax Powd (Polyethylene glycol 3350) .... One capful dissolved in 8 ounces of fluid by mouth once daily as needed for constipation. disp: one month  supply. refill: prn 20)  Risperidone 2 Mg Tabs (Risperidone) .... Sig 1/2 tab by mouth at bedtime for 10 days then increase to one tab by mouth qhs 21)  Glucotrol 5 Mg Tabs (Glipizide) .... Take 1 pill daily 22)  Advair Diskus 250-50 Mcg/dose Aepb (Fluticasone-salmeterol) .Marland Kitchen.. 1 puff bid  Patient Instructions: 1)  When you get these spells: stop, sit down, and focus on your breathing- deep breaths in and out, slowly.   2)  Resume your medicines: one of your blood pressure medicines can lower also help with slowing your heart rate 3)  Follow-up with Dr. [name] - make appointment out front.   Orders Added: 1)  [facility]- Est Level  3 [66440]

## 2010-10-02 NOTE — Assessment & Plan Note (Signed)
Summary: f/u,df   Vital Signs:  Patient profile:   60 year old female Height:      63.25 inches Weight:      175.1 pounds BMI:     30.88 Temp:     98.1 degrees F oral Pulse rate:   77 / minute BP sitting:   91 / 67  (left arm) Cuff size:   regular  Vitals Entered By: Gladstone Pih (Jan 15, 2010 11:12 AM) CC: F/U Is Patient Diabetic? Yes Did you bring your meter with you today? No Pain Assessment Patient in pain? no        Primary Care Provider:  [name] MD  CC:  F/U.  History of Present Illness: Diabetes follow up with blood sugars in pretty   good control,   no episodes of low blood sugar. Taking medicines regularly and having no problems with them. Was doing really well w diet but that is not the case over last 1 mnth.  Follow up hypertension. Taking medicines regularly with no problems. Not having any any headaches or chest pains. has quesions about how much salt she could use. She says I told her toi not use any salt a all.  Saw the cardiologist an has f/u toorrow.  Has noticed she is having some break through anxiety signs during day--but admits she is not taking her anxiety medicine 3x per day. denies any iincrease in depression    Habits & Providers  Alcohol-Tobacco-Diet     Tobacco Status: never  Current Medications (verified): 1)  Advair Diskus 500-50 Mcg/dose  Misc (Fluticasone-Salmeterol) .... One Puff Bid 2)  Albuterol 90 Mcg/act Aers (Albuterol) .... Inhale 2 Puff Using Inhaler Every Six Hours(Patient Prefers Not Hfa Propellant) 3)  Cartia Xt 180 Mg Cp24 (Diltiazem Hcl Coated Beads) .... Take 1 Capsule By Mouth Once A Day 4)  Flonase 50 Mcg/act Susp (Fluticasone Propionate) .... On Hold As Needed Use 5)  Glucophage 1000 Mg Tabs (Metformin Hcl) .... Take 1 Tablet By Mouth Twice A Day 6)  Klor-Con 10 10 Meq Tbcr (Potassium Chloride) .... Take 1 Tablet By Mouth Twice A Day 7)  Lasix 20 Mg Tabs (Furosemide) .... Take 1 Tablet By Mouth Twice A Day 8)   Lipitor 80 Mg Tabs (Atorvastatin Calcium) .... Take 1 Tablet By Mouth At Bedtime 9)  Benazepril Hcl 5 Mg Tabs (Benazepril Hcl) .... One By Mouth Daily 10)  Plavix 75 Mg Tabs (Clopidogrel Bisulfate) .Marland Kitchen.. 1 Tablet By Mouth Once A Day 11)  Ativan 2 Mg  Tabs (Lorazepam) .... Three Times A Day 12)  Neurontin 300 Mg  Caps (Gabapentin) .... Two Times A Day 13)  Albuterol Sulfate (2.5 Mg/46ml) 0.083%  Nebu (Albuterol Sulfate) .... As Directed Up To One Vial Qid Per Nebulizer 14)  Theophylline Cr 300 Mg  Tb12 (Theophylline) .Marland Kitchen.. 1 By Mouth Two Times A Day 15)  Atrovent Hfa 17 Mcg/act Aers (Ipratropium Bromide Hfa) .... 4 Puffs Every 2 Hours As Needed For Shortness of Breath 16)  Allegra-D 12 Hour 60-120 Mg Xr12h-Tab (Fexofenadine-Pseudoephedrine) .... Two Times A Day Prn 17)  Omeprazole 40 Mg Cpdr (Omeprazole) .Marland Kitchen.. 1 By Mouth Qd 18)  Ipratropium-Albuterol 0.5-2.5 (3) Mg/85ml Soln (Ipratropium-Albuterol) .Marland Kitchen.. 1 Treatment Every 4 Hours Scheduled and Every 2 Hours As Needed For Coughing.  Take For The Next 5 Days 19)  Ferrous Sulfate 324 Mg Tabs (Ferrous Sulfate) .Marland Kitchen.. 1 By Mouth Three Times A Day 20)  Vicodin 5-500 Mg Tabs (Hydrocodone-Acetaminophen) .Marland Kitchen.. 1 By Mouth Two  Times A Day Prn 21)  Aspirin 81 Mg Tbec (Aspirin) .... Take 1 Tablet By Mouth Once A Day 22)  Doxycycline Hyclate 100 Mg Tabs (Doxycycline Hyclate) .... Take 1 Tab By Mouth Twice A Day For 10 Days 23)  Miralax  Powd (Polyethylene Glycol 3350) .... One Capful Dissolved in 8 Ounces of Fluid By Mouth Once Daily As Needed For Constipation. Disp: One Month Supply. Refill: Prn 24)  Risperidone 2 Mg Tabs (Risperidone) .... Sig 1/2 Tab By Mouth At Bedtime For 10 Days Then Increase To One Tab By Mouth Qhs 25)  Glucotrol 5 Mg Tabs (Glipizide) .... Take 1 Pill Daily  Allergies: 1)  ! Aspirin 2)  Codeine  Past History:  Past Medical History: Last updated: 04/01/2007 L Breast CA--StageII mulitifoc infiltrating ductal, lgsil 4/06--dr. Jerolyn Center (leep),  Lumpectomy (1 out of 12 nodes positive (2mm focus), opth-- bernstorf ([address]), Arimidex (1mg  q d) therapy for 5+ years (heme/onc),  s/p chemo with cytoxan and adriamycin 2003,  s/p radiation for breast CA (magrinot/young) 2003  quit smoking 12/2003,  Diverticulosis (dx by colonoscopy 3/04), repeat colonoscopy w EGD and camera capsule 2008 (normal)  h/o postmenopausal bleeding,  hospitalized 11/2003 VDRF, pseudomonas pneumonia, hospitalized 01/2003 community-acquired pneumonia,   Past Surgical History: Last updated: 04/01/2007  12/27/2003, lumpectomy with sentinel node bx   ABG:  7.34/pCO2 59/pO66/ - 06/01/2003, baseline O2 sat 92% at rest; 83% c ambulation - 10/19/2000, Baseline Peak Flows 270 - 09/24/2003, c/w severe obstruction, low vital capacity -   09/24/2003, Cardiac Cath:  critical lesions x2  RCA - 12/14/2003, Cardiolite: EF 56%, 4.6 mets - 05/01/2004, Cardiolite--neg, inferapical scar, ef 60% - 05/26/2006, s/p angioplasty and stenting - 12/14/2003  Colonoscopy-2004 w repeat 2008. EGD 2008  iv iron per heme - 06/23/2006,  -- 11/29/2001,  Nerve Conduction:  moderate B carpal tunnel syndrome - 05/21/2003, , Spirometry:  FVC 57%, FEV1 26% - 09/24/2003, VDRF requiring intubation (first time) - 12/20/2003  Family History: Last updated: 10/28/2006 leukemia, asthma, allergies, DM, poor circulation,, lung CA, father - gastric CA  Social History: Last updated: 04/01/2007 [place]; married to husband [name]); smoked 1 ppd--- quit smoking 12/2003,  no etoh, drugs; disabled due to COPD;  worked as Agricultural engineer; 10th grade educationout to get married.  Daughter lives in [place] and son Jonny Ruiz) lives outside of [place].  Total of 4 granddaughters.  Physical Exam  Neck:  supple, full ROM, no masses, no thyromegaly, and no carotid bruits.   Lungs:  normal breath sounds.   Heart:  normal rate, regular rhythm, and no murmur.   Abdomen:  soft and non-tender.     Impression &  Recommendations:  Problem # 1:  DIABETES MELLITUS II, UNCOMPLICATED (ICD-250.00)  Her updated medication list for this problem includes:    Glucophage 1000 Mg Tabs (Metformin hcl) .Marland Kitchen... Take 1 tablet by mouth twice a day    Benazepril Hcl 5 Mg Tabs (Benazepril hcl) ..... One by mouth daily    Aspirin 81 Mg Tbec (Aspirin) .Marland Kitchen... Take 1 tablet by mouth once a day    Glucotrol 5 Mg Tabs (Glipizide) .Marland Kitchen... Take 1 pill daily  Orders: A1C-[facility] (16109) [facility]- Est  Level 4 (60454) We discussed going back to the "better diet'   Problem # 2:  HYPERTENSION, BENIGN SYSTEMIC (ICD-401.1)  Her updated medication list for this problem includes:    Cartia Xt 180 Mg Cp24 (Diltiazem hcl coated beads) .Marland Kitchen... Take 1 capsule by mouth once a day  Lasix 20 Mg Tabs (Furosemide) .Marland Kitchen... Take 1 tablet by mouth twice a day    Benazepril Hcl 5 Mg Tabs (Benazepril hcl) ..... One by mouth daily great control today we discussed salt--i am Ok with moderate salt ESp if she uses less sugar as a result  Orders: [facility]- Est  Level 4 (99214)  Problem # 3:  BIPOLAR DISORDER (ICD-296.7)  discussed her anxiety--she agreed to take her meds as rx and see if this helps her anxiety level.  Orders: [facility]- Est  Level 4 (99214)  Complete Medication List: 1)  Advair Diskus 500-50 Mcg/dose Misc (Fluticasone-salmeterol) .... One puff bid 2)  Albuterol 90 Mcg/act Aers (Albuterol) .... Inhale 2 puff using inhaler every six hours(patient prefers not hfa propellant) 3)  Cartia Xt 180 Mg Cp24 (Diltiazem hcl coated beads) .... Take 1 capsule by mouth once a day 4)  Flonase 50 Mcg/act Susp (Fluticasone propionate) .... On hold as needed use 5)  Glucophage 1000 Mg Tabs (Metformin hcl) .... Take 1 tablet by mouth twice a day 6)  Klor-con 10 10 Meq Tbcr (Potassium chloride) .... Take 1 tablet by mouth twice a day 7)  Lasix 20 Mg Tabs (Furosemide) .... Take 1 tablet by mouth twice a day 8)  Lipitor 80 Mg Tabs (Atorvastatin calcium) .... Take 1  tablet by mouth at bedtime 9)  Benazepril Hcl 5 Mg Tabs (Benazepril hcl) .... One by mouth daily 10)  Plavix 75 Mg Tabs (Clopidogrel bisulfate) .Marland Kitchen.. 1 tablet by mouth once a day 11)  Ativan 2 Mg Tabs (Lorazepam) .... Three times a day 12)  Neurontin 300 Mg Caps (Gabapentin) .... Two times a day 13)  Albuterol Sulfate (2.5 Mg/30ml) 0.083% Nebu (Albuterol sulfate) .... As directed up to one vial qid per nebulizer 14)  Theophylline Cr 300 Mg Tb12 (Theophylline) .Marland Kitchen.. 1 by mouth two times a day 15)  Atrovent Hfa 17 Mcg/act Aers (Ipratropium bromide hfa) .... 4 puffs every 2 hours as needed for shortness of breath 16)  Allegra-d 12 Hour 60-120 Mg Xr12h-tab (Fexofenadine-pseudoephedrine) .... Two times a day prn 17)  Omeprazole 40 Mg Cpdr (Omeprazole) .Marland Kitchen.. 1 by mouth qd 18)  Ipratropium-albuterol 0.5-2.5 (3) Mg/29ml Soln (Ipratropium-albuterol) .Marland Kitchen.. 1 treatment every 4 hours scheduled and every 2 hours as needed for coughing.  take for the next 5 days 19)  Ferrous Sulfate 324 Mg Tabs (Ferrous sulfate) .Marland Kitchen.. 1 by mouth three times a day 20)  Vicodin 5-500 Mg Tabs (Hydrocodone-acetaminophen) .Marland Kitchen.. 1 by mouth two times a day prn 21)  Aspirin 81 Mg Tbec (Aspirin) .... Take 1 tablet by mouth once a day 22)  Doxycycline Hyclate 100 Mg Tabs (Doxycycline hyclate) .... Take 1 tab by mouth twice a day for 10 days 23)  Miralax Powd (Polyethylene glycol 3350) .... One capful dissolved in 8 ounces of fluid by mouth once daily as needed for constipation. disp: one month supply. refill: prn 24)  Risperidone 2 Mg Tabs (Risperidone) .... Sig 1/2 tab by mouth at bedtime for 10 days then increase to one tab by mouth qhs 25)  Glucotrol 5 Mg Tabs (Glipizide) .... Take 1 pill daily   Laboratory Results   Blood Tests   Date/Time Received: Jan 15, 2010 11:18 AM  Date/Time Reported: Jan 15, 2010 11:37 AM   HGBA1C: 7.7%   (Normal Range: Non-Diabetic - 3-6%   Control Diabetic - 6-8%)  Comments: ...............test  performed by......Marland KitchenBonnie A. [place], MLS (ASCP)cm     Prevention & Chronic Care Immunizations  Influenza vaccine: Fluvax 3+  (05/30/2008)   Influenza vaccine due: 05/30/2009    Tetanus booster: 05/30/2008: Tdap   Tetanus booster due: 05/30/2018    Pneumococcal vaccine: Done.  (05/31/2001)   Pneumococcal vaccine due: None    H. zoster vaccine: Not documented  Colorectal Screening   Hemoccult: not indicated  (07/03/2008)   Hemoccult due: Not Indicated    Colonoscopy: Adenomatous Polyp  (11/30/2002)   Colonoscopy due: 11/29/2012  Other Screening   Pap smear: has gyn provider  (07/04/2008)   Pap smear action/deferral: Not indicated-other  (05/08/2009)   Pap smear due: Not Indicated    Mammogram: BI-RADS CATEGORY 2:  Benign finding(s).^MM DIGITAL DIAGNOSTIC UNILAT L  (11/02/2008)   Mammogram due: 11/02/2009    DXA bone density scan: Not documented   Smoking status: never  (01/15/2010)  Diabetes Mellitus   HgbA1C: 7.7  (01/15/2010)   HgbA1C action/deferral: Ordered  (05/08/2009)   Hemoglobin A1C due: 12/15/2009    Eye exam: No diabetic retinopathy.     (10/21/2009)    Foot exam: yes  (07/16/2008)   Foot exam action/deferral: Do today   High risk foot: Not documented   Foot care education: Not documented   Foot exam due: 12/15/2009    Urine microalbumin/creatinine ratio: Not documented    Diabetes flowsheet reviewed?: Yes   Progress toward A1C goal: Improved  Lipids   Total Cholesterol: 182  (09/26/2009)   Lipid panel action/deferral: Lipid Panel ordered   LDL: 116  (09/26/2009)   LDL Direct: 82  (07/10/2009)   HDL: 44  (09/26/2009)   Triglycerides: 109  (09/26/2009)   Lipid panel due: 12/15/2009    SGOT (AST): 12  (09/26/2009)   BMP action: Ordered   SGPT (ALT): 13  (09/26/2009)   Alkaline phosphatase: 87  (09/26/2009)   Total bilirubin: 0.3  (09/26/2009)   Liver panel due: 12/15/2009    Lipid flowsheet reviewed?: Yes   Progress toward LDL goal:  Unchanged  Hypertension   Last Blood Pressure: 91 / 67  (01/15/2010)   Serum creatinine: 0.7  (02/13/2009)   Serum potassium 4.0  (02/13/2009)   Basic metabolic panel due: 12/15/2009    Hypertension flowsheet reviewed?: Yes   Progress toward BP goal: At goal  Self-Management Support :   Personal Goals (by the next clinic visit) :     Personal A1C goal: 7  (01/15/2010)     Personal blood pressure goal: 130/80  (09/16/2009)     Personal LDL goal: 70  (09/16/2009)    Diabetes self-management support: Written self-care plan  (09/25/2009)    Diabetes self-management support not done because: Good outcomes  (07/10/2009)    Hypertension self-management support: Written self-care plan  (09/25/2009)    Hypertension self-management support not done because: Good outcomes  (09/16/2009)    Lipid self-management support: Written self-care plan  (09/25/2009)     Lipid self-management support not done because: Good outcomes  (07/10/2009)

## 2010-10-02 NOTE — Assessment & Plan Note (Signed)
Summary: f/u DM/eo   Vital Signs:  Patient profile:   60 year old female Height:      63.25 inches Weight:      176 pounds BMI:     31.04 BSA:     1.84 O2 Sat:      90 % Temp:     98.4 degrees F Pulse rate:   85 / minute BP sitting:   104 / 68  Vitals Entered By: Jone Baseman CMA (April 23, 2010 10:38 AM) CC: F/U DM Is Patient Diabetic? Yes Did you bring your meter with you today? No   Primary Care Provider:  [name] MD  CC:  F/U DM.  History of Present Illness: 1) DM:  Diabetes follow up with blood sugars in    good control,   no episodes of low blood sugar. Taking medicines regularly and having no problems with them.  2) HTN:  Follow up hypertension. Taking medicines regularly with no problems. Not having any any headaches or chest pains.  3) having some continued left groin "fullness" thatt she notices early in day--has been there many months--and it seems  to go away later in day. Worried as she is a cancer survivor. Has f/u with onc doctors on regular basis. Nopain in groin, no vaginal d/c or bleeding. Area just "looks funny" as she gets dressed in morning. 4) asked her aboput the request I had for Leda Roys says she has not taken it in a long time--gave her bad dreams--she is not sure whythe pharmacy faxed request--she will speak w pharmacy  Current Medications (verified): 1)  Albuterol 90 Mcg/act Aers (Albuterol) .... Inhale 2 Puff Using Inhaler Every Six Hours(Patient Prefers Not Hfa Propellant) 2)  Cartia Xt 180 Mg Cp24 (Diltiazem Hcl Coated Beads) .... Take 1 Capsule By Mouth Once A Day 3)  Flonase 50 Mcg/act Susp (Fluticasone Propionate) .... On Hold As Needed Use 4)  Glucophage 1000 Mg Tabs (Metformin Hcl) .... Take 1 Tablet By Mouth Twice A Day 5)  Klor-Con 10 10 Meq Tbcr (Potassium Chloride) .... Take 1 Tablet By Mouth Twice A Day 6)  Lasix 20 Mg Tabs (Furosemide) .... Take 1 Tablet By Mouth Twice A Day 7)  Lipitor 80 Mg Tabs (Atorvastatin Calcium) ....  Take 1 Tablet By Mouth At Bedtime 8)  Benazepril Hcl 5 Mg Tabs (Benazepril Hcl) .... One By Mouth Daily 9)  Plavix 75 Mg Tabs (Clopidogrel Bisulfate) .Marland Kitchen.. 1 Tablet By Mouth Once A Day 10)  Ativan 2 Mg  Tabs (Lorazepam) .... Three Times A Day 11)  Neurontin 300 Mg  Caps (Gabapentin) .... Two Times A Day 12)  Albuterol Sulfate (2.5 Mg/78ml) 0.083%  Nebu (Albuterol Sulfate) .... As Directed Up To One Vial Qid Per Nebulizer 13)  Theophylline Cr 300 Mg  Tb12 (Theophylline) .Marland Kitchen.. 1 By Mouth Two Times A Day 14)  Atrovent Hfa 17 Mcg/act Aers (Ipratropium Bromide Hfa) .... 4 Puffs Every 2 Hours As Needed For Shortness of Breath 15)  Allegra-D 12 Hour 60-120 Mg Xr12h-Tab (Fexofenadine-Pseudoephedrine) .... Two Times A Day Prn 16)  Omeprazole 40 Mg Cpdr (Omeprazole) .Marland Kitchen.. 1 By Mouth Qd 17)  Ipratropium-Albuterol 0.5-2.5 (3) Mg/15ml Soln (Ipratropium-Albuterol) .Marland Kitchen.. 1 Treatment Every 4 Hours Scheduled and Every 2 Hours As Needed For Coughing.  Take For The Next 5 Days 18)  Aspirin 81 Mg Tbec (Aspirin) .... Take 1 Tablet By Mouth Once A Day 19)  Miralax  Powd (Polyethylene Glycol 3350) .... One Capful Dissolved in 8 Ounces of  Fluid By Mouth Once Daily As Needed For Constipation. Disp: One Month Supply. Refill: Prn 20)  Risperidone 2 Mg Tabs (Risperidone) .... Sig 1/2 Tab By Mouth At Bedtime For 10 Days Then Increase To One Tab By Mouth Qhs 21)  Glucotrol 5 Mg Tabs (Glipizide) .... Take 1 Pill Daily 22)  Advair Diskus 250-50 Mcg/dose Aepb (Fluticasone-Salmeterol) .Marland Kitchen.. 1 Puff Bid  Allergies (verified): 1)  ! Aspirin 2)  Codeine  Past History:  Past Medical History: L Breast CA--StageII mulitifoc infiltrating ductal,  Lumpectomy (1 out of 12 nodes positive (2mm focus), opth-- bernstorf ([address]), Arimidex (1mg  q d) therapy for 5+ years (heme/onc),  s/p chemo with cytoxan and adriamycin 2003,  s/p radiation for breast CA (magrinot/young) 2003   lgsil 4/06--dr. Jerolyn Center (leep), quit smoking  12/2003,  Diverticulosis (dx by colonoscopy 3/04), repeat colonoscopy w EGD and camera capsule 2008 (normal)  h/o postmenopausal bleeding,  hospitalized 11/2003 VDRF, pseudomonas pneumonia, hospitalized 01/2003 community-acquired pneumonia,   Past Surgical History: Reviewed history from 04/01/2007 and no changes required.  12/27/2003, lumpectomy with sentinel node bx   ABG:  7.34/pCO2 59/pO66/ - 06/01/2003, baseline O2 sat 92% at rest; 83% c ambulation - 10/19/2000, Baseline Peak Flows 270 - 09/24/2003, c/w severe obstruction, low vital capacity -   09/24/2003, Cardiac Cath:  critical lesions x2  RCA - 12/14/2003, Cardiolite: EF 56%, 4.6 mets - 05/01/2004, Cardiolite--neg, inferapical scar, ef 60% - 05/26/2006, s/p angioplasty and stenting - 12/14/2003  Colonoscopy-2004 w repeat 2008. EGD 2008  iv iron per heme - 06/23/2006,  -- 11/29/2001,  Nerve Conduction:  moderate B carpal tunnel syndrome - 05/21/2003, , Spirometry:  FVC 57%, FEV1 26% - 09/24/2003, VDRF requiring intubation (first time) - 12/20/2003  Review of Systems  The patient denies anorexia, fever, weight loss, weight gain, chest pain, syncope, dyspnea on exertion, prolonged cough, headaches, hemoptysis, abdominal pain, melena, severe indigestion/heartburn, hematuria, incontinence, genital sores, difficulty walking, and depression.         Please see HPI for additional ROS.   Physical Exam  General:  alert, well-developed, well-nourished, and well-hydrated.   Neck:  supple, full ROM, no masses, no thyromegaly, and normal carotid upstroke.   Lungs:  normal respiratory effort and normal breath sounds.   Heart:  normal rate, regular rhythm, and no murmur.   Abdomen:  soft and non-tender.   Genitalia:  external genitalia without lesion. Left groin area symmetrical with right--no adenopathy either side, no tenderness, no warmth erythema or skin changes. Msk:  Hips B FROM  IR/ER painless Neurologic:  alert & oriented X3.   Psych:  normally  interactive, good eye contact, not anxious appearing, and not depressed appearing.     Impression & Recommendations:  Problem # 1:  DIABETES MELLITUS II, UNCOMPLICATED (ICD-250.00)  Orders: A1C-[facility] (16109) [facility]- Est  Level 4 (99214) doiong fairly well A1C a little better rtc 3 m  Problem # 2:  HYPERTENSION, BENIGN SYSTEMIC (ICD-401.1)  Her updated medication list for this problem includes:    Cartia Xt 180 Mg Cp24 (Diltiazem hcl coated beads) .Marland Kitchen... Take 1 capsule by mouth once a day    Lasix 20 Mg Tabs (Furosemide) .Marland Kitchen... Take 1 tablet by mouth twice a day    Benazepril Hcl 5 Mg Tabs (Benazepril hcl) ..... One by mouth daily good control no med changes  Orders: [facility]- Est  Level 4 (99214)  Problem # 3:  BREAST CANCER (ICD-174.9)  eval of left groin revealed nothing worrisome. She will follow and rtc  any new signs or changes. she is good about f/u with her oncologists as wel as her gyn doctor by her report  Orders: [facility]- Est  Level 4 (52841)  Complete Medication List: 1)  Albuterol 90 Mcg/act Aers (Albuterol) .... Inhale 2 puff using inhaler every six hours(patient prefers not hfa propellant) 2)  Cartia Xt 180 Mg Cp24 (Diltiazem hcl coated beads) .... Take 1 capsule by mouth once a day 3)  Flonase 50 Mcg/act Susp (Fluticasone propionate) .... On hold as needed use 4)  Glucophage 1000 Mg Tabs (Metformin hcl) .... Take 1 tablet by mouth twice a day 5)  Klor-con 10 10 Meq Tbcr (Potassium chloride) .... Take 1 tablet by mouth twice a day 6)  Lasix 20 Mg Tabs (Furosemide) .... Take 1 tablet by mouth twice a day 7)  Lipitor 80 Mg Tabs (Atorvastatin calcium) .... Take 1 tablet by mouth at bedtime 8)  Benazepril Hcl 5 Mg Tabs (Benazepril hcl) .... One by mouth daily 9)  Plavix 75 Mg Tabs (Clopidogrel bisulfate) .Marland Kitchen.. 1 tablet by mouth once a day 10)  Ativan 2 Mg Tabs (Lorazepam) .... Three times a day 11)  Neurontin 300 Mg Caps (Gabapentin) .... Two times a day 12)  Albuterol Sulfate  (2.5 Mg/78ml) 0.083% Nebu (Albuterol sulfate) .... As directed up to one vial qid per nebulizer 13)  Theophylline Cr 300 Mg Tb12 (Theophylline) .Marland Kitchen.. 1 by mouth two times a day 14)  Atrovent Hfa 17 Mcg/act Aers (Ipratropium bromide hfa) .... 4 puffs every 2 hours as needed for shortness of breath 15)  Allegra-d 12 Hour 60-120 Mg Xr12h-tab (Fexofenadine-pseudoephedrine) .... Two times a day prn 16)  Omeprazole 40 Mg Cpdr (Omeprazole) .Marland Kitchen.. 1 by mouth qd 17)  Ipratropium-albuterol 0.5-2.5 (3) Mg/43ml Soln (Ipratropium-albuterol) .Marland Kitchen.. 1 treatment every 4 hours scheduled and every 2 hours as needed for coughing.  take for the next 5 days 18)  Aspirin 81 Mg Tbec (Aspirin) .... Take 1 tablet by mouth once a day 19)  Miralax Powd (Polyethylene glycol 3350) .... One capful dissolved in 8 ounces of fluid by mouth once daily as needed for constipation. disp: one month supply. refill: prn 20)  Risperidone 2 Mg Tabs (Risperidone) .... Sig 1/2 tab by mouth at bedtime for 10 days then increase to one tab by mouth qhs 21)  Glucotrol 5 Mg Tabs (Glipizide) .... Take 1 pill daily 22)  Advair Diskus 250-50 Mcg/dose Aepb (Fluticasone-salmeterol) .Marland Kitchen.. 1 puff bid  Prevention & Chronic Care Immunizations   Influenza vaccine: Fluvax 3+  (05/30/2008)   Influenza vaccine due: 05/30/2009    Tetanus booster: 05/30/2008: Tdap   Tetanus booster due: 05/30/2018    Pneumococcal vaccine: Done.  (05/31/2001)   Pneumococcal vaccine due: None    H. zoster vaccine: Not documented  Colorectal Screening   Hemoccult: not indicated  (07/03/2008)   Hemoccult due: Not Indicated    Colonoscopy: Adenomatous Polyp  (11/30/2002)   Colonoscopy due: 11/29/2012  Other Screening   Pap smear: has gyn provider  (07/04/2008)   Pap smear action/deferral: Not indicated-other  (05/08/2009)   Pap smear due: Not Indicated    Mammogram: ASSESSMENT: Negative - BI-RADS 1^MM DIGITAL SCREENING  (04/04/2010)   Mammogram due: 11/02/2009    DXA  bone density scan: Not documented   Smoking status: never  (01/15/2010)  Diabetes Mellitus   HgbA1C: 7.4  (04/23/2010)   HgbA1C action/deferral: Ordered  (05/08/2009)   Hemoglobin A1C due: 12/15/2009    Eye exam: No diabetic retinopathy.     (  10/21/2009)    Foot exam: yes  (07/16/2008)   Foot exam action/deferral: Do today   High risk foot: Not documented   Foot care education: Not documented   Foot exam due: 12/15/2009    Urine microalbumin/creatinine ratio: Not documented    Diabetes flowsheet reviewed?: Yes   Progress toward A1C goal: At goal  Lipids   Total Cholesterol: 182  (09/26/2009)   Lipid panel action/deferral: Lipid Panel ordered   LDL: 116  (09/26/2009)   LDL Direct: 82  (07/10/2009)   HDL: 44  (09/26/2009)   Triglycerides: 109  (09/26/2009)   Lipid panel due: 12/15/2009    SGOT (AST): 12  (09/26/2009)   BMP action: Ordered   SGPT (ALT): 13  (09/26/2009)   Alkaline phosphatase: 87  (09/26/2009)   Total bilirubin: 0.3  (09/26/2009)   Liver panel due: 12/15/2009    Lipid flowsheet reviewed?: Yes   Progress toward LDL goal: At goal  Hypertension   Last Blood Pressure: 104 / 68  (04/23/2010)   Serum creatinine: 0.7  (02/13/2009)   Serum potassium 4.0  (02/13/2009)   Basic metabolic panel due: 12/15/2009    Hypertension flowsheet reviewed?: Yes   Progress toward BP goal: At goal  Self-Management Support :   Personal Goals (by the next clinic visit) :     Personal A1C goal: 7  (01/15/2010)     Personal blood pressure goal: 130/80  (09/16/2009)     Personal LDL goal: 70  (09/16/2009)    Diabetes self-management support: Written self-care plan  (09/25/2009)    Diabetes self-management support not done because: Good outcomes  (07/10/2009)    Hypertension self-management support: Written self-care plan  (09/25/2009)    Hypertension self-management support not done because: Good outcomes  (09/16/2009)    Lipid self-management support: Written  self-care plan  (09/25/2009)     Lipid self-management support not done because: Good outcomes  (07/10/2009)   Laboratory Results   Blood Tests   Date/Time Received: April 23, 2010 10:40 AM  Date/Time Reported: April 23, 2010 10:59 AM   HGBA1C: 7.4%   (Normal Range: Non-Diabetic - 3-6%   Control Diabetic - 6-8%)  Comments: ...........test performed by...........Marland KitchenTerese Door, CMA

## 2010-10-02 NOTE — Progress Notes (Signed)
Summary: pls call  Phone Note Call from Patient Call back at Home Phone [phone]   Caller: Patient Summary of Call: needs to talk to nurse - very important. Initial call taken by: De Nurse,  September 04, 2009 3:41 PM  Follow-up for Phone Call        states she is having urinary incontinence when she coughs. wants adult diapers. told her i am not sure her insurance will pay for them. she will call the pharmacy & ask. if her insurance does cover it, she will need a rx for them. until then advised sanitary pads. says she cannot afford those either Follow-up by: Golden Circle RN,  September 04, 2009 3:45 PM

## 2010-10-03 ENCOUNTER — Telehealth: Payer: Self-pay | Admitting: Family Medicine

## 2010-10-08 NOTE — Assessment & Plan Note (Signed)
Summary: c/o swollen gland to neck/quite concerned/neal's pt/bmc   Vital Signs:  Patient profile:   60 year old female Height:      63.25 inches Weight:      172.0 pounds BMI:     30.34 Temp:     98.4 degrees F oral Pulse rate:   96 / minute BP sitting:   111 / 69  (right arm) Cuff size:   regular  Vitals Entered By: Jimmy Footman, CMA (September 26, 2010 2:30 PM) CC: cold sxs 5 days, swollen gland   Referring Provider:  Dr. [name] Primary Provider:  [name] MD  CC:  cold sxs 5 days and swollen gland.  History of Present Illness: Pt presents with a three day history of rhinnorhea, sore throat, and cough.  She notes that the cough is worse in the morning and that the sore throat has, at this point, mostly resolved.  She has had subjective fevers but no body aches, nausea, vomiting, or diarrhea.  She noted a swoolen node in her left neck that appeared about 4 days ago.  She is worried that this might represent cancer.  She has been taking tylenol for her symptoms, which has been helping.  Preventive Screening-Counseling & Management  Alcohol-Tobacco     Smoking Status: quit  Current Medications (verified): 1)  Albuterol 90 Mcg/act Aers (Albuterol) .... Inhale 2 Puff Using Inhaler Every Six Hours(Patient Prefers Not Hfa Propellant) 2)  Cartia Xt 180 Mg Cp24 (Diltiazem Hcl Coated Beads) .... Take 1 Capsule By Mouth Once A Day 3)  Flonase 50 Mcg/act Susp (Fluticasone Propionate) .... On Hold As Needed Use 4)  Glucophage 1000 Mg Tabs (Metformin Hcl) .... Take 1 Tablet By Mouth Twice A Day 5)  Klor-Con 10 10 Meq Tbcr (Potassium Chloride) .... Take 1 Tablet By Mouth Twice A Day 6)  Lasix 20 Mg Tabs (Furosemide) .... Take 1 Tablet By Mouth Twice A Day 7)  Lipitor 80 Mg Tabs (Atorvastatin Calcium) .... Take 1 Tablet By Mouth At Bedtime 8)  Benazepril Hcl 5 Mg Tabs (Benazepril Hcl) .... One By Mouth Daily 9)  Plavix 75 Mg Tabs (Clopidogrel Bisulfate) .Marland Kitchen.. 1 Tablet By Mouth Once A  Day 10)  Ativan 2 Mg  Tabs (Lorazepam) .... Three Times A Day 11)  Neurontin 300 Mg  Caps (Gabapentin) .... Two Times A Day 12)  Albuterol Sulfate (2.5 Mg/23ml) 0.083%  Nebu (Albuterol Sulfate) .... As Directed Up To One Vial Qid Per Nebulizer 13)  Theophylline Cr 300 Mg  Tb12 (Theophylline) .Marland Kitchen.. 1 By Mouth Two Times A Day 14)  Atrovent Hfa 17 Mcg/act Aers (Ipratropium Bromide Hfa) .... 4 Puffs Every 2 Hours As Needed For Shortness of Breath 15)  Omeprazole 40 Mg Cpdr (Omeprazole) .Marland Kitchen.. 1 By Mouth Qd 16)  Ipratropium-Albuterol 0.5-2.5 (3) Mg/27ml Soln (Ipratropium-Albuterol) .Marland Kitchen.. 1 Treatment Every 4 Hours Scheduled and Every 2 Hours As Needed For Coughing.  Take For The Next 5 Days 17)  Aspirin 81 Mg Tbec (Aspirin) .... Take 1 Tablet By Mouth Once A Day 18)  Risperidone 2 Mg Tabs (Risperidone) .... Sig 1/2 Tab By Mouth At Bedtime For 10 Days Then Increase To One Tab By Mouth Qhs 19)  Glucotrol 5 Mg Tabs (Glipizide) .... Take 1 Pill Daily 20)  Advair Diskus 250-50 Mcg/dose Aepb (Fluticasone-Salmeterol) .Marland Kitchen.. 1 Puff Bid 21)  Ferro-Bob 325 (65 Fe) Mg Tabs (Ferrous Sulfate) .Marland Kitchen.. 1 By Mouth Three Times A Day  Allergies (verified): 1)  ! Aspirin 2)  Codeine 3)  * Ambien  Past History:  Past medical, surgical, family and social histories (including risk factors) reviewed for relevance to current acute and chronic problems.  Past Medical History: Reviewed history from 04/23/2010 and no changes required. L Breast CA--StageII mulitifoc infiltrating ductal,  Lumpectomy (1 out of 12 nodes positive (2mm focus), opth-- bernstorf ([address]), Arimidex (1mg  q d) therapy for 5+ years (heme/onc),  s/p chemo with cytoxan and adriamycin 2003,  s/p radiation for breast CA (magrinot/young) 2003   lgsil 4/06--dr. Jerolyn Center (leep), quit smoking 12/2003,  Diverticulosis (dx by colonoscopy 3/04), repeat colonoscopy w EGD and camera capsule 2008 (normal)  h/o postmenopausal bleeding,  hospitalized 11/2003 VDRF,  pseudomonas pneumonia, hospitalized 01/2003 community-acquired pneumonia,   Past Surgical History: Reviewed history from 04/01/2007 and no changes required.  12/27/2003, lumpectomy with sentinel node bx   ABG:  7.34/pCO2 59/pO66/ - 06/01/2003, baseline O2 sat 92% at rest; 83% c ambulation - 10/19/2000, Baseline Peak Flows 270 - 09/24/2003, c/w severe obstruction, low vital capacity -   09/24/2003, Cardiac Cath:  critical lesions x2  RCA - 12/14/2003, Cardiolite: EF 56%, 4.6 mets - 05/01/2004, Cardiolite--neg, inferapical scar, ef 60% - 05/26/2006, s/p angioplasty and stenting - 12/14/2003  Colonoscopy-2004 w repeat 2008. EGD 2008  iv iron per heme - 06/23/2006,  -- 11/29/2001,  Nerve Conduction:  moderate B carpal tunnel syndrome - 05/21/2003, , Spirometry:  FVC 57%, FEV1 26% - 09/24/2003, VDRF requiring intubation (first time) - 12/20/2003  Family History: Reviewed history from 10/28/2006 and no changes required. leukemia, asthma, allergies, DM, poor circulation,, lung CA, father - gastric CA  Social History: Reviewed history from 04/01/2007 and no changes required. 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  Daughter lives in [place] and son Jonny Ruiz) lives outside of [place].  Total of 4 granddaughters.Smoking Status:  quit  Review of Systems       The patient complains of fever.  The patient denies anorexia, weight loss, weight gain, vision loss, decreased hearing, hoarseness, chest pain, syncope, dyspnea on exertion, peripheral edema, prolonged cough, headaches, hemoptysis, abdominal pain, melena, hematochezia, severe indigestion/heartburn, and hematuria.    Physical Exam  General:  alert, well-developed, well-nourished, and well-hydrated.   Head:  normocephalic and atraumatic.   Eyes:  vision grossly intact.   Ears:  R ear normal and L ear normal.   TM normal bilaterally Nose:  no external deformity.  Mild erythema of turbinates bilaterally Mouth:  good dentition.  Some post-nasal drip noted Neck:  supple, full ROM, and no masses.  No nodes palpable at this time Lungs:  normal breath sounds.   Heart:  normal rate, regular rhythm, and no murmur.     Impression & Recommendations:  Problem # 1:  UPPER RESPIRATORY INFECTION (ICD-465.9)  Feel the patient's symptoms fit well with a viral URI.  Symptoms are resolving with time and OTC treatment.  No worrysome findings on exam.  No red flags in history.  Pt provided with reassurance and told that she should be feeling better within the next 7 days.  Pt to return if not feeling a fair amount better in that time.  Her updated medication list for this problem includes:    Aspirin 81 Mg Tbec (Aspirin) .Marland Kitchen... Take 1 tablet by mouth once a day  Orders: [facility]- Est Level  3 (16109)  Complete Medication List: 1)  Albuterol 90 Mcg/act Aers (Albuterol) .... Inhale 2 puff using inhaler every six hours(patient prefers not hfa propellant) 2)  Cartia Xt 180 Mg Cp24 (Diltiazem hcl coated beads) .... Take 1 capsule by mouth once a day 3)  Flonase 50 Mcg/act Susp (Fluticasone propionate) .... On hold as needed use 4)  Glucophage 1000 Mg Tabs (Metformin hcl) .... Take 1 tablet by mouth twice a day 5)  Klor-con 10 10 Meq Tbcr (Potassium chloride) .... Take 1 tablet by mouth twice a day 6)  Lasix 20 Mg Tabs (Furosemide) .... Take 1 tablet by mouth twice a day 7)  Lipitor 80 Mg Tabs (Atorvastatin calcium) .... Take 1 tablet by mouth at bedtime 8)  Benazepril Hcl 5 Mg Tabs (Benazepril hcl) .... One by mouth daily 9)  Plavix 75 Mg Tabs (Clopidogrel bisulfate) .Marland Kitchen.. 1 tablet by mouth once a day 10)  Ativan 2 Mg Tabs (Lorazepam) .... Three times a day 11)  Neurontin 300 Mg Caps (Gabapentin) .... Two times a day 12)  Albuterol Sulfate (2.5 Mg/75ml) 0.083% Nebu (Albuterol sulfate) .... As directed up to one vial qid per  nebulizer 13)  Theophylline Cr 300 Mg Tb12 (Theophylline) .Marland Kitchen.. 1 by mouth two times a day 14)  Atrovent Hfa 17 Mcg/act Aers (Ipratropium bromide hfa) .... 4 puffs every 2 hours as needed for shortness of breath 15)  Omeprazole 40 Mg Cpdr (Omeprazole) .Marland Kitchen.. 1 by mouth qd 16)  Ipratropium-albuterol 0.5-2.5 (3) Mg/35ml Soln (Ipratropium-albuterol) .Marland Kitchen.. 1 treatment every 4 hours scheduled and every 2 hours as needed for coughing.  take for the next 5 days 17)  Aspirin 81 Mg Tbec (Aspirin) .... Take 1 tablet by mouth once a day 18)  Risperidone 2 Mg Tabs (Risperidone) .... Sig 1/2 tab by mouth at bedtime for 10 days then increase to one tab by mouth qhs 19)  Glucotrol 5 Mg Tabs (Glipizide) .... Take 1 pill daily 20)  Advair Diskus 250-50 Mcg/dose Aepb (Fluticasone-salmeterol) .Marland Kitchen.. 1 puff bid 21)  Ferro-bob 325 (65 Fe) Mg Tabs (Ferrous sulfate) .Marland Kitchen.. 1 by mouth three times a day  Patient Instructions: 1)  It was great to see you today! 2)  I do not feel any swollen glands in your neck.  I think what you were feeling is just because you had a cold, and is totally normal. 3)  I am glad to hear that you are doing somewhat better today than you were on Tuesday.  I expect you will continue to get better over the next several days. 4)  Continue to take Tylenol as needed over the next several days.  you can also use nasal saline to try to clear out your nose. 5)  Your lungs sound great today! 6)  I do not think that you have any type of infection that needs either antibiotics or prednisone. 7)  Feel free to come back if you aren't doing better by the middle of next week.   Orders Added: 1)  [facility]- Est Level  3 [60454]

## 2010-10-16 ENCOUNTER — Encounter (INDEPENDENT_AMBULATORY_CARE_PROVIDER_SITE_OTHER): Payer: MEDICARE | Admitting: *Deleted

## 2010-10-16 NOTE — Progress Notes (Signed)
Summary: phn msg  Phone Note Call from Patient Call back at Home Phone [phone]   Caller: Patient Summary of Call: pt states she "tripped out" last night - "lost her mind" - feels pretty good today went to ED last night - gave her meds needs to talk to nurse insisted she needs to talk to Dr [name]  Initial call taken by: De Nurse,  October 03, 2010 8:53 AM  Follow-up for Phone Call        states she "lost my mind last night". c/o not remembering anything.  spouse took her to ED yesterday am and left to do errands. she does not know what they did for her. states she slept well last night for the first time in days. feels better today.  reviewed meds. she never took the risperidol. asked her pharmacist about it & was told it was for bipolar. states she did not know she was bipolar. I explained what that was & how it can be mild or severe. states it runs in her family & endorses depression& anxiety and periods of high activity & racing thoughts & insomnia. wanted to speak with pcp. told her she was in another location today but I will send her this note. wants to know if she should take the risperidol. (also not taking flonase,denies symptoms).  wants someone to fill her pill boxes. told her her insurance will not pay for this. states her husband cannot help. denies having anyone else she can turn to. suggested writing down each pill as she takes it so she knows if she took it or not. states she will try this. calmer at end of call Follow-up by: Golden Circle RN,  October 03, 2010 9:04 AM

## 2010-10-23 ENCOUNTER — Other Ambulatory Visit: Payer: Self-pay | Admitting: Family Medicine

## 2010-10-23 NOTE — Telephone Encounter (Signed)
Refill request

## 2010-10-25 ENCOUNTER — Other Ambulatory Visit: Payer: Self-pay | Admitting: Family Medicine

## 2010-10-26 NOTE — Telephone Encounter (Signed)
Refill request

## 2010-11-16 LAB — COMPREHENSIVE METABOLIC PANEL
Albumin: 3.7 g/dL (ref 3.5–5.2)
BUN: 11 mg/dL (ref 6–23)
Chloride: 99 mEq/L (ref 96–112)
Creatinine, Ser: 0.74 mg/dL (ref 0.4–1.2)
Total Bilirubin: 0.2 mg/dL — ABNORMAL LOW (ref 0.3–1.2)

## 2010-11-16 LAB — BASIC METABOLIC PANEL
GFR calc non Af Amer: >60 mL/min (ref >60)
Glucose, Bld: 249 mg/dL — ABNORMAL HIGH (ref 70–99)
Potassium: 4.3 mEq/L (ref 3.5–5.1)
Sodium: 141 mEq/L (ref 135–145)

## 2010-11-16 LAB — CBC
HCT: 34.2 % — ABNORMAL LOW (ref 36.0–46.0)
HCT: 35.7 % — ABNORMAL LOW (ref 36.0–46.0)
Hemoglobin: 11.7 g/dL — ABNORMAL LOW (ref 12.0–15.0)
MCV: 91.3 fL (ref 78.0–100.0)
Platelets: 279 10*3/uL (ref 150–400)
RDW: 13.7 % (ref 11.5–15.5)
RDW: 14 % (ref 11.5–15.5)
WBC: 10.6 10*3/uL — ABNORMAL HIGH (ref 4.0–10.5)

## 2010-11-16 LAB — URINALYSIS, ROUTINE W REFLEX MICROSCOPIC
Bilirubin Urine: NEGATIVE
Hgb urine dipstick: NEGATIVE
Protein, ur: NEGATIVE mg/dL
Specific Gravity, Urine: 1.023 (ref 1.005–1.030)
Urobilinogen, UA: 0.2 mg/dL (ref 0.0–1.0)

## 2010-11-16 LAB — CK TOTAL AND CKMB (NOT AT ARMC)
CK, MB: 4.1 ng/mL — ABNORMAL HIGH (ref 0.3–4.0)
Relative Index: 3.9 — ABNORMAL HIGH (ref 0.0–2.5)
Total CK: 106 U/L (ref 7–177)

## 2010-11-16 LAB — DIFFERENTIAL
Basophils Absolute: 0 10*3/uL (ref 0.0–0.1)
Lymphocytes Relative: 16 % (ref 12–46)
Monocytes Absolute: 1 10*3/uL (ref 0.1–1.0)
Neutro Abs: 7.6 10*3/uL (ref 1.7–7.7)

## 2010-11-16 LAB — GLUCOSE, CAPILLARY

## 2010-11-17 LAB — GLUCOSE, CAPILLARY: Glucose-Capillary: 277 mg/dL — ABNORMAL HIGH (ref 70–99)

## 2010-11-18 LAB — POCT URINALYSIS DIP (DEVICE)
Bilirubin Urine: NEGATIVE
Glucose, UA: NEGATIVE mg/dL
Nitrite: NEGATIVE

## 2010-11-18 LAB — POCT I-STAT, CHEM 8
Chloride: 96 mEq/L (ref 96–112)
HCT: 41 % (ref 36.0–46.0)
Potassium: 3.4 mEq/L — ABNORMAL LOW (ref 3.5–5.1)
Sodium: 136 mEq/L (ref 135–145)

## 2010-11-27 ENCOUNTER — Other Ambulatory Visit: Payer: Self-pay | Admitting: Family Medicine

## 2010-12-01 ENCOUNTER — Encounter (HOSPITAL_BASED_OUTPATIENT_CLINIC_OR_DEPARTMENT_OTHER): Payer: MEDICARE | Admitting: Oncology

## 2010-12-01 ENCOUNTER — Other Ambulatory Visit: Payer: Self-pay | Admitting: Oncology

## 2010-12-01 ENCOUNTER — Telehealth: Payer: Self-pay | Admitting: Family Medicine

## 2010-12-01 DIAGNOSIS — E7800 Pure hypercholesterolemia, unspecified: Secondary | ICD-10-CM

## 2010-12-01 DIAGNOSIS — Z1231 Encounter for screening mammogram for malignant neoplasm of breast: Secondary | ICD-10-CM

## 2010-12-01 DIAGNOSIS — C50919 Malignant neoplasm of unspecified site of unspecified female breast: Secondary | ICD-10-CM

## 2010-12-01 LAB — POCT URINALYSIS DIP (DEVICE)
Glucose, UA: NEGATIVE mg/dL
Ketones, ur: 15 mg/dL — AB
Specific Gravity, Urine: 1.015 (ref 1.005–1.030)

## 2010-12-01 MED ORDER — ATORVASTATIN CALCIUM 80 MG PO TABS: 80.0000 mg | ORAL_TABLET | Freq: Every day | ORAL | Status: DC

## 2010-12-01 NOTE — Telephone Encounter (Signed)
Ms. Maria Gaines has been waiting for her refill on her cholesterol meds for Atorvastatin.  Pharmacy requested but had a response yet.  Please call in to [facility] on [address]

## 2010-12-02 ENCOUNTER — Encounter (HOSPITAL_BASED_OUTPATIENT_CLINIC_OR_DEPARTMENT_OTHER): Payer: MEDICARE | Admitting: Oncology

## 2010-12-02 ENCOUNTER — Other Ambulatory Visit: Payer: Self-pay | Admitting: Family Medicine

## 2010-12-02 ENCOUNTER — Other Ambulatory Visit: Payer: Self-pay | Admitting: Oncology

## 2010-12-02 DIAGNOSIS — C50919 Malignant neoplasm of unspecified site of unspecified female breast: Secondary | ICD-10-CM

## 2010-12-02 LAB — CBC WITH DIFFERENTIAL/PLATELET
Eosinophils Absolute: 0.2 10*3/uL (ref 0.0–0.5)
HCT: 39.9 % (ref 34.8–46.6)
LYMPH%: 29 % (ref 14.0–49.7)
MONO#: 0.7 10*3/uL (ref 0.1–0.9)
NEUT#: 5.1 10*3/uL (ref 1.5–6.5)
NEUT%: 59.8 % (ref 38.4–76.8)
Platelets: 273 10*3/uL (ref 145–400)
RBC: 4.4 10*6/uL (ref 3.70–5.45)
WBC: 8.5 10*3/uL (ref 3.9–10.3)
lymph#: 2.5 10*3/uL (ref 0.9–3.3)

## 2010-12-02 LAB — COMPREHENSIVE METABOLIC PANEL
ALT: 13 U/L (ref 0–35)
CO2: 23 mEq/L (ref 19–32)
Calcium: 9.7 mg/dL (ref 8.4–10.5)
Chloride: 101 mEq/L (ref 96–112)
Glucose, Bld: 129 mg/dL — ABNORMAL HIGH (ref 70–99)
Sodium: 140 mEq/L (ref 135–145)
Total Bilirubin: 0.3 mg/dL (ref 0.3–1.2)
Total Protein: 7 g/dL (ref 6.0–8.3)

## 2010-12-04 ENCOUNTER — Other Ambulatory Visit: Payer: Self-pay | Admitting: Family Medicine

## 2010-12-29 ENCOUNTER — Other Ambulatory Visit: Payer: Self-pay | Admitting: Family Medicine

## 2010-12-30 ENCOUNTER — Telehealth: Payer: Self-pay | Admitting: Family Medicine

## 2010-12-30 MED ORDER — OMEPRAZOLE 40 MG PO CPDR: 40.0000 mg | DELAYED_RELEASE_CAPSULE | Freq: Every day | ORAL | Status: DC

## 2010-12-30 NOTE — Telephone Encounter (Signed)
Dear [name] Can you call this in--I do NOT know what the issue is--I have sent it multiple times. THANKS! [name]

## 2010-12-30 NOTE — Telephone Encounter (Signed)
Call rx into pharmacy

## 2010-12-30 NOTE — Telephone Encounter (Signed)
Please fax rx for the Omeprazole to [facility] on [address].  Pt called this am to pharmacy and still they didn't have.  Call back when sent.

## 2011-01-02 ENCOUNTER — Ambulatory Visit: Payer: MEDICARE | Admitting: Family Medicine

## 2011-01-02 NOTE — Progress Notes (Deleted)
This encounter was created in error - please disregard.  This encounter was created in error - please disregard.

## 2011-01-13 ENCOUNTER — Emergency Department (HOSPITAL_COMMUNITY)
Admission: EM | Admit: 2011-01-13 | Discharge: 2011-01-13 | Disposition: A | Discharge status: Home / Self Care | Payer: MEDICARE | Attending: Emergency Medicine | Admitting: Emergency Medicine

## 2011-01-13 ENCOUNTER — Emergency Department (HOSPITAL_COMMUNITY): Payer: MEDICARE

## 2011-01-13 DIAGNOSIS — R0609 Other forms of dyspnea: Secondary | ICD-10-CM | POA: Insufficient documentation

## 2011-01-13 DIAGNOSIS — E7800 Pure hypercholesterolemia, unspecified: Secondary | ICD-10-CM | POA: Insufficient documentation

## 2011-01-13 DIAGNOSIS — I10 Essential (primary) hypertension: Secondary | ICD-10-CM | POA: Insufficient documentation

## 2011-01-13 DIAGNOSIS — J4489 Other specified chronic obstructive pulmonary disease: Secondary | ICD-10-CM | POA: Insufficient documentation

## 2011-01-13 DIAGNOSIS — J40 Bronchitis, not specified as acute or chronic: Secondary | ICD-10-CM | POA: Insufficient documentation

## 2011-01-13 DIAGNOSIS — I509 Heart failure, unspecified: Secondary | ICD-10-CM | POA: Insufficient documentation

## 2011-01-13 DIAGNOSIS — E119 Type 2 diabetes mellitus without complications: Secondary | ICD-10-CM | POA: Insufficient documentation

## 2011-01-13 DIAGNOSIS — I2510 Atherosclerotic heart disease of native coronary artery without angina pectoris: Secondary | ICD-10-CM | POA: Insufficient documentation

## 2011-01-13 DIAGNOSIS — R0989 Other specified symptoms and signs involving the circulatory and respiratory systems: Secondary | ICD-10-CM | POA: Insufficient documentation

## 2011-01-13 DIAGNOSIS — J449 Chronic obstructive pulmonary disease, unspecified: Secondary | ICD-10-CM | POA: Insufficient documentation

## 2011-01-13 LAB — BASIC METABOLIC PANEL
CO2: 33 mEq/L — ABNORMAL HIGH (ref 19–32)
Chloride: 90 mEq/L — ABNORMAL LOW (ref 96–112)
Creatinine, Ser: <0.47 mg/dL (ref 0.4–1.2)
Potassium: 3.7 mEq/L (ref 3.5–5.1)
Sodium: 133 mEq/L — ABNORMAL LOW (ref 135–145)

## 2011-01-13 LAB — CBC
Hemoglobin: 13.3 g/dL (ref 12.0–15.0)
Platelets: 263 10*3/uL (ref 150–400)
RBC: 4.5 MIL/uL (ref 3.87–5.11)
WBC: 9.5 10*3/uL (ref 4.0–10.5)

## 2011-01-13 LAB — DIFFERENTIAL
Basophils Absolute: 0 10*3/uL (ref 0.0–0.1)
Basophils Relative: 0 % (ref 0–1)
Eosinophils Absolute: 0.2 10*3/uL (ref 0.0–0.7)
Monocytes Relative: 10 % (ref 3–12)
Neutro Abs: 5.6 10*3/uL (ref 1.7–7.7)
Neutrophils Relative %: 58 % (ref 43–77)

## 2011-01-13 LAB — POCT I-STAT 3, ART BLOOD GAS (G3+)
Bicarbonate: 34.2 mEq/L — ABNORMAL HIGH (ref 20.0–24.0)
Patient temperature: 98.9
TCO2: 36 mmol/L (ref 0–100)
pCO2 arterial: 54.3 mmHg — ABNORMAL HIGH (ref 35.0–45.0)
pH, Arterial: 7.408 — ABNORMAL HIGH (ref 7.350–7.400)

## 2011-01-13 LAB — PRO B NATRIURETIC PEPTIDE: Pro B Natriuretic peptide (BNP): 1314 pg/mL — ABNORMAL HIGH (ref 0–125)

## 2011-01-13 NOTE — H&P (Signed)
NAME:  Maria Gaines, Maria Gaines              ACCOUNT NO.:  [id number]   MEDICAL RECORD NO.:  [id number]          PATIENT TYPE:  INP   LOCATION:  4732                         FACILITY:  [facility]   PHYSICIAN:  Nestor Ramp, MD        DATE OF BIRTH:  [birth year]   DATE OF ADMISSION:  06/08/2007  DATE OF DISCHARGE:  02/15/2007                              HISTORY & PHYSICAL   CHIEF COMPLAINT:  Shortness of breath.   HISTORY OF PRESENT ILLNESS:  This is a 60 year old female who [facility]  [facility] for feeling faint with cough and  shortness of breath.  The patient states that she has had 2 days of  increased cough with green sputum that she noted.  There were large  amounts of green sputum she has produced and she has filled 3 trash cans  with paper towels and sputum yesterday, as described by the patient.  The patient has significant fatigue and weakness.  She denies chills and  sweats.  She has increased shortness of breath and occasional feeling of  burning in her chest.  She did drive herself to the clinic this  morning, however was afraid she might not make it.  She denies chest  pain or burning initially. However, later on the exam she said that she  was having a burning pain in her chest.  It was noted during the time  that I examined her as a resident that the patient was able to complete  full sentences, and was lying comfortably on her side, however did have  that burning pain.   PAST MEDICAL HISTORY:  CHF, with an EF of less than 50%, history of  breast cancer, hypertension, bipolar disorder, COPD, coronary artery  disease, diabetes type 2, hypercholesterolemia, GERD, and anemia.  The  patient has breast cancer, which is stage II, multifocal, infiltrating  ductal lesion.  She received radiation, as well as chemotherapy in 2003  for this, as well as lumpectomy in 2005, diverticulosis, history of  postmenopausal bleeding.   In the past, her O2 level has been as low  as 92% at rest, and 83% with  ambulation.   The patient did have angioplasty and stenting Jan 13, 2004 due to  critical lesions of RCA.   PAST SURGICAL HISTORY:  Lumpectomy with sentinel node biopsy in 2005,  carpal tunnel syndrome surgery.   ALLERGIES:  ASPIRIN.   MEDICATIONS:  1. Advair Diskus 250/50, 1 puff b.i.d.  2. Albuterol 2 puffs p.r.n. q.6 hours.  3. Cardia XL 180 mg p.o. daily.  4. Clarinex 5 mg p.o. daily.  5. Cartia XT 10 mg p.o. daily.  6. Flonase 50 mcg, 1 spray to each nostril daily.  7. Glucophage 100 mg p.o. b.i.d.  8. Klor-Con 10 mEq, 1 tablet p.o. b.i.d.  9. Lamictal 100 mg p.o. daily.  10.Lasix 20 mg, 1 tablet p.o. b.i.d.  11.Lipitor 80 mg, 1 tablet p.o. nightly.  12.Lotensin 5 mg, 1 tablet p.o. nightly.  13.Plavix 75 mg, 1 tablet p.o. daily.  14.Ativan 1 tablet p.o. b.i.d.  15.Neurontin 300  mg, 1 tablet p.o. b.i.d.   SOCIAL HISTORY:  The patient is a Electrical engineer, married.  She smoked  1 pack per day, however quit in 2005.  She denies alcohol, denies drugs.  She receives disability for COPD . She does work as a Agricultural engineer.   FAMILY HISTORY:  Leukemia, asthma, allergies, diabetes, poor  circulation, lung cancer, and gastric cancer.   PHYSICAL EXAMINATION:  VITAL SIGNS:  O2 saturation 88%.  Temp is 99.1.  Pulse 114, blood pressure 102/67.  GENERAL:  Lying down on side, uncomfortable, however not in acute  distress.  EYES:  Conjunctivae anicteric.  MOUTH:  Good dentition.  No exudate.  Pharynx pink and moist.  NECK:  Supple.  Full range of movement.  No masses or bruits.  LUNGS:  Decreased breath sounds throughout, with wheezing bilaterally in  both upper lobes.  Normal respiratory effort.  Questionable rhonchi on  bilateral bases.  CARDIOVASCULAR:  Tachycardia.  Regular rhythm.  No murmurs, gallops, or  rubs.  ABDOMEN:  Obese, soft, nontender.  Normal bowel sounds.  No masses, no  guarding.  MUSCULOSKELETAL:  Normal range of movement.   There is no joint  tenderness or swelling.  .Pulses:  Right and left radial pulses and  popliteal pulses are normal.  EXTREMITIES:  No clubbing, cyanosis or edema or deformity.  NEUROLOGIC:  Alert and oriented x3.  Strength normal in all extremities.  SKIN:  Turgor is normal.  Color normal, no rashes or lesions.  CERVICAL NODES:  No anterior cervical adenopathy.  No posterior cervical  adenopathy.  AXILLARY NODES:  No right axillary or left axillary adenopathy.  PSYCHIATRIC:  Oriented x3.  Memory intact.  Not anxious appearing or  depressed.   LABORATORY DATA:  None.   ASSESSMENT AND PLAN:  1. Chronic obstructive pulmonary disease has deteriorated.  Likely the      shortness of breath is a chronic obstructive pulmonary disease      exacerbation versus pneumonia.  Will admit to telemetry bed.  The      patient also has EKG changes in leads, which are now inverted in      leads IV, V, previously upright.  Will be started on albuterol for      chronic obstructive pulmonary disease exacerbation, get chest x-ray      PA and lateral.  We are going to hold albuterol nebulizer      treatments until the first set of cardiac enzymes are performed,      and then likely continue.  Continue patient on oxygen.  If chest x-      ray is normal and workup is normal, we must consider pulmonary      embolus in the differential as the patient has history of lung      cancer.  2. Coronary artery disease:  The patient has history of coronary      artery disease with stent in place.  There is question of ischemic      strain noted with EKG changes and inverted T-waves.  Currently she      is having burning pain, which is intermittent.  We placed her on      oxygen.  SHE IS ALLERGIC TO ASPIRIN. Will admit to a telemetry bed.      We are going to get cardiac enzymes, first set, on admission to the      hospital, and repeat x3 every 6 hours.  We are going to monitor for  acute chest pain, dizziness, or  sweats, continue on oxygen.  Will      hold off beta-blocker at this point due to chronic obstructive      pulmonary disease exacerbation as well as blood pressure.  Dr. [name]      discussed this case with me as well.  We will not add on      nitroglycerin at this point; however, we will consider it.  3. Diabetes.  Hold metformin until we are sure the patient will not      need cardiac catheterization.  Sliding scale insulin.  Monitor for      now.  4. Hypertension.  Continue medications except for Lasix.  5. Bipolar disorder.  Continue Lamictal.  6. Fluids, electrolytes, nutrition, gastrointestinal:  Maintenance      intravenous fluids until the patient is feeling better.      Tachycardia likely secondary to volume depletion.  Will hold      nebulizers at this point.  We are going to hydrate and encourage      p.o. intake.      Johney Maine, M.D.  Electronically Signed      Nestor Ramp, MD  Electronically Signed    JT/MEDQ  D:  06/08/2007  T:  06/08/2007  Job:  [id number]

## 2011-01-13 NOTE — Discharge Summary (Signed)
NAME:  Maria Gaines, Maria Gaines              ACCOUNT NO.:  [id number]   MEDICAL RECORD NO.:  [id number]          PATIENT TYPE:  INP   LOCATION:  4732                         FACILITY:  [facility]   PHYSICIAN:  Eustaquio Boyden, MD   DATE OF BIRTH:  09-May-1951   DATE OF ADMISSION:  06/08/2007  DATE OF DISCHARGE:  06/09/2007                               DISCHARGE SUMMARY   DISCHARGE DIAGNOSES:  1. Chronic obstructive pulmonary disease exacerbation.  2. Hyperlipidemia.  3. Hypertension.  4. Chronic obstructive pulmonary disease.  5. Coronary artery disease.  6. Type 2 diabetes.  7. Bipolar disorder.  8. Anemia.   CONSULTING PHYSICIAN:  None.   PROCEDURE:  Chest x-ray showing bronchitis versus reactive airway  disease along with emphysema, but no pneumonia.   EKG showing upon admission flipped T-waves in the lateral leads, but on  repeat, no evidence of this and stable from previous EKG done in April  of this year.  Therefore, dynamic changes.   LABORATORY DATA:  Discharge labs:  Sodium 132, potassium of 4.5,  chloride 96, bicarb 27, BUN 17, creatinine 0.86, glucose 257, calcium  9.4, total bilirubin 0.5, alk phos 77, AST 16, ALT 13, albumin 3.4,  total protein 6.7.  Cardiac enzymes negative times three.  Lipid panel:  Cholesterol 139, triglycerides 42, HDL 62, LDL 69.  White blood cells  12.1, hemoglobin 11.5, hematocrit 35.4, platelets 259,000, left shift  with neutrophils 87%.   HISTORY AND PHYSICAL:  For full summary, please see dictated H&P.  In  brief, this is a 60 year old female with a history of COPD, coronary  artery disease, breast cancer, history of hospitalization with COPD in  2005 and remote history of smoking who presents with a COPD  exacerbation.  The patient admitted from clinic with an O2 saturation of  88% on two liters nasal cannula.   1. COPD exacerbation:  The patient was admitted with low saturations      of 88% on nasal cannula.  Prednisone 40 mg p.o. daily was  started      along with Avelox 400 mg p.o. daily and the patient was admitted to      the floor.  Of note, the patient spiked a fever once to 102.4, but      patient did not complain throughout this time during this fever      spike and the patient's remaining temperatures were normal.  Short-      acting bronchodilators were held due to the patient's initial      tachycardia, but upon review of chest x-ray and resolution of      tachycardia, Xopenex was started q.4 h. p.r.n. wheezing.      Overnight, the patient self-weaned off of oxygen down to one liter      and at times room air.  The patient tolerated this well.  The      patient eager to go home on previous regimen of Advair and      albuterol along with new prednisone burst and Avelox antibiotic.      The patient is to follow-up early next  week with primary care      doctor.  2. Hyperlipidemia:  Lipitor was continued.  Lipid panel during this      presentation showed cholesterol 139, triglycerides 42, HDL 62 and      LDL 69.  No changes were made.  3. Hypertension:  The patient was continued on home dose of benazepril      and Diltiazem.  The patient tolerated this well.  4. EKG changes:  Of note, the patient upon presentation had an EKG      which was with some evidence for ischemia including flipped T-waves      in V3 and V4.  Repeat EKG in the morning showed this resolved and      was unchanged from previous EKG done in April.  EKG upon      presentation also had tachycardia.  On repeat, this had resolved as      well, most consistent with dynamic EKG changes.  No further workup      was pursued.  5. Diabetes:  Metformin was held and the patient was placed on sliding      scale insulin.  The patient tolerated this well with good blood      sugar control.  6. Anemia:  The patient continued with low hemoglobins of 12 and 11.5      during this hospitalization.  Previous workup a couple months ago      by GI with capsule  endoscopy revealed no source of acute bleed, so      they labeled it as chronic occult blood loss anemia.  The patient      was stable throughout hospitalization.  7. Coronary artery disease:  The patient continued on home dose of      Plavix.  The patient had a cardiac catheterization in 2005 which      showed critical lesions in the right coronary artery and is status      post angioplasty and stenting in April of 2005.   DISCHARGE MEDICATIONS:  1. Prednisone 40 mg 1 p.o. daily for eight more days.  Prescription      given.  2. Avelox 400 mg 1 p.o. daily for four more days.  Prescription given.  3. Advair 1 puff b.i.d.  4. Albuterol p.r.n. q.4 h. shortness of breath or wheezing.  5. Diltiazem 180 mg 1 p.o. daily.  6. Flexeril 10 mg 1 p.o. q. nightly.  7. Metformin 1000 mg 1 p.o. daily.  8. Klor-Con 10 mEq 1 p.o. b.i.d.  9. Lamictal 100 mg 1 p.o. daily.  10.Lasix 20 mg 1 p.o. b.i.d.  11.Lipitor 80 mg 1 p.o. daily.  12.Benazepril 5 mg 1 p.o. daily.  13.Plavix 75 mg 1 p.o. daily.  14.Ativan 2 mg 1 p.o. b.i.d. p.r.n.  15.Neurontin 300 mg 1 p.o. daily.  16.Clarinex as needed.  17.Flonase as needed.   DISCHARGE INSTRUCTIONS:  Please keep your appointment with Dr. [name]  next week.  Please return if you worsen or give clinic a call if you  have any other worsening symptoms of shortness of breath.  Ensure to  watch your sugars while you are on steroids for eight more days.  Also  make sure to eat a healthy diet.   FOLLOW UP:  The patient is to follow-up with Dr. [name] [facility] on Wednesday, October 15th at 9:15 a.m.   Issues for follow-up:  Resolution of exacerbation.      Eustaquio Boyden, MD  Electronically Signed     JG/MEDQ  D:  06/09/2007  T:  06/10/2007  Job:  [id number]   cc:   Nestor Ramp, MD

## 2011-01-13 NOTE — Assessment & Plan Note (Signed)
[facility]                         GASTROENTEROLOGY OFFICE NOTE   Maria Gaines, Maria Gaines                     MRN:          [id number]  DATE:01/05/2007                            DOB:          02-Jun-1951    REASON FOR CONSULTATION:  Iron deficiency anemia.   ASSESSMENT:  A 60 year old [place] woman who has iron deficiency anemia  with ferritin of 5, iron saturation of 5%, and a hemoglobin of 9.7 in  April of 2008. At this time we are assuming this is from chronic occult  blood loss anemia. She has received an IV iron infusion and been started  on p.o. iron coordinated through doctors Magrinat and Dr. Cleophas Dunker.  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   She takes Plavix for a drug-eluting stent and is followed by Dr.  Nanetta Batty.   She has some sensitivity in her epigastric area which is chronic. She  has lost weight since starting Lipitor which she attributes to that but  is really an unintentional weight loss of 20 pounds in a year as best I  can tell. There is no early satiety, no other significant GI symptoms  that I can tell other than some occasional nausea and occasional  heartburn.   RECOMMENDATIONS AND PLAN:  This lady should have repeat colonoscopy and  endoscopy to look for causes of her blood loss and suspected blood loss  anemia. If this is unrevealing consideration of investigating the small  bowel  with a capsule endoscopy should be given. Celiac sprue is  possible I suppose but this seems unlikely but is certainly possible.  The weight loss is somewhat concerning as well. She has a history of  breast cancer, I doubt that is related to her problems here.   I have explained the risks, benefits, and indications of the EGD and  colonoscopy. There is  extra risk because we should temporarily halt her  Plavix, which could increase her risk of stent occlusion and myocardial  infarction but it has been a number of years since she has had this drug-  eluting stent. We will ask Dr. Allyson Sabal for his opinion on whether she can  stop the Plavix for 5 to 7 days prior to her procedures. We would  continue aspirin. I would still pursue the procedures if she needed to  stay on her Plavix.   For history details see my medical history form.   MEDICATIONS:  1. Benazepril 5 mg daily.  2. Theophylline 200 mg b.i.d.  3. Lipitor 80 mg daily.  4. Baby aspirin daily.  5. Plavix 75 mg daily.  6. Advair 250/50 two puffs daily.  7. Arimidex 1 mg daily.  8. Cartia XT 180 mg daily.  9. Klor con 10 mEq 2 each day.  10.Albuterol 2 puffs daily.  11.Gabapentin 300 mg 2 each day.  12.Metformin 1000 mg twice daily.  13.Caltrate 600 plus vitamin D daily.  14.Rhinocort daily.  15.Clarinex 5 mg daily.  16.Lorazepam 5 mg 2 each day.  17.Extra Strength Tylenol daily.  18.Lasix 20 mg 2 each day as needed.   ALLERGIES:  ASPIRIN AND CODEINE UPSET HER STOMACH.   PAST MEDICAL HISTORY/PROBLEMS:  1. Symptomatic rectocele with posterior repair by Dr. Vincente Poli July 02, 2006.  2. Chronic obstructive pulmonary disease.  3. Anemia.  4. Breast cancer, stage 2, multi focal infiltrating ductal cancer.  5. Prior LEEP procedure.  6. Former smoker.  7. Status post chemotherapy and radiation for her breast cancer 2003.  8. Diabetes mellitus.  9. Obesity.  10.Dyslipidemia.  11.Osteoarthritis.  12.Anxiety and panic disturbance.  13.Coronary artery disease with drug-eluting stent.  14.History of ventilatory dependent respiratory failure.  15.Anxiety.  16.Colonoscopy and EGD findings as described above.  17.Allergic rhinitis.  18.History of depression.   For social history, family history, and review of systems, see my  medical history form. She is very tired and  she feels somewhat pale.   PHYSICAL EXAMINATION:  Shows a well-developed, well-nourished [place]  woman, height 5 feet 2 inches, weight 164 pounds, blood pressure 110/64,  pulse 60.  EYES: Anicteric.  ENT: Normal mouth and posterior pharynx.  NECK: Supple, no thyromegaly.  CHEST: Clear.  HEART: S1, S2. No murmurs, rubs, or gallops.  ABDOMEN: Mildly tender in the epigastrium (she says she is always  sensitive there), no organomegaly or mass.  RECTAL EXAM: Deferred.  LYMPHATIC: No neck or supraclavicular nodes.  EXTREMITIES: Free of edema.  SKIN: She is slightly pale.  PSYCH: She is alert and oriented x3.   I have reviewed records in E chart, I have reviewed the office note sent  by Dr. Cleophas Dunker. I have reviewed my old records.   I appreciate the opportunity to care for this patient.     [name], MD,FACG  Electronically Signed    CEG/MedQ  DD: 01/05/2007  DT: 01/05/2007  Job #: [id number]   cc:   Melina Fiddler, MD  Nanetta Batty, M.D.  Michelle L. Vincente Poli, M.D.  Valentino Hue. Magrinat, M.D.

## 2011-01-13 NOTE — Assessment & Plan Note (Signed)
[facility]                         GASTROENTEROLOGY OFFICE NOTE   KESHARA, KIGER                     MRN:          [id number]  DATE:03/02/2007                            DOB:          08/15/1951    SMALL BOWEL CAPSULE ENDOSCOPY.   Please see the full report for details.  This is in our chart.   Small bowel capsule endoscopy exam was a complete exam with fairly good  prep.  The stomach was obscured.  There were no abnormalities seen in  the investigation of her chronic occult blood loss anemia.   RECOMMENDATIONS AND PLAN:  No further workup at this time as EGD and  colonoscopy have been unrevealing.  Will support with iron therapy and  follow up her CBC.     [name], MD,FACG  Electronically Signed    CEG/MedQ  DD: 03/16/2007  DT: 03/17/2007  Job #: [id number]

## 2011-01-13 NOTE — Discharge Summary (Signed)
NAME:  Maria Gaines, ERCK              ACCOUNT NO.:  [id number]   MEDICAL RECORD NO.:  [id number]          PATIENT TYPE:  INP   LOCATION:  3729                         FACILITY:  [facility]   PHYSICIAN:  Cristy Hilts. Jacinto Halim, MD       DATE OF BIRTH:  28-Mar-1951   DATE OF ADMISSION:  08/25/2007  DATE OF DISCHARGE:  08/26/2007                               DISCHARGE SUMMARY   DISCHARGE DIAGNOSES:  1. Chest pain and tachycardia, most likely secondary to a panic      attack.  2. Coronary disease with an RCA DES placed by Dr. Aleen Campi in 2005      with negative nuclear study earlier this year.  3. Treated hypertension.  4. Treated dyslipidemia with an LDL of 73.  5. Past history of breast cancer.  6. Past history of chronic obstructive pulmonary disease, she quit      smoking in 2005.   HOSPITAL COURSE:  The patient is a pleasant 60 year old female followed  by Dr. Allyson Sabal now.  She had a RCA DES in 2005 by Dr. Aleen Campi and  reportedly has had a negative nuclear study 6 to 8 months ago.  She  presented to the emergency room August 25, 2007 with chest pain and  tachycardia and palpitations.  She was admitted to telemetry for further  evaluation for 23-hour observation.  Her enzymes were negative.  Her  symptoms resolved.  Her medicines were continued from home.  The patient  admitted to me this morning that she thinks she probably had a panic  attack.  She says her husband constantly yells at her and makes her very  anxious and nervous.  She is now considering leaving him.  She feels she  can go home this morning.  She did not want to wait for the cardiologist  to come round on her.   DISCHARGE MEDICATIONS:  Benazepril 5 mg a day, theophylline 200 mg  b.i.d. Lipitor 80 mg a day, aspirin 81 mg a day, Plavix 75 mg a day,  Advair 250 b.i.d., Lasix 20 mg a day, Arimidex 1 mg a day, Cardizem CD  180 daily, calcium b.i.d., potassium 10 meq b.i.d., albuterol inhaler  q.i.d., Neurontin 300 mg b.i.d.,  metformin 1 gram b.i.d., Clarinex 5 mg  a day and lorazepam 2 mg b.i.d. to t.i.d., she does take this daily.   LABORATORY DATA:  Telemetry shows sinus rhythm.  Troponins were negative  x3.  White count 6.4, hemoglobin 11.4, hematocrit 35, platelets 257,000.  Sodium 139, potassium 4.0, BUN 7, creatinine 0.6, LDL 73, HDL 53,  cholesterol 137.  The LFTs were normal,  TSH is 1.07,  D-dimers 0.42.  Chest x-ray shows COPD, no active disease with INR 0.9.  The EKG shows sinus rhythm,  left posterior fascicular block,  nonspecific ST changes.   DISPOSITION:  The patient is discharged in stable condition and will  follow-up with Dr. Allyson Sabal in a couple weeks as well as her family doctor.      Abelino Derrick, P.A.      Cristy Hilts. Jacinto Halim, MD  Electronically Signed  LKK/MEDQ  D:  08/26/2007  T:  08/26/2007  Job:  [id number]   cc:   Nanetta Batty, M.D.

## 2011-01-16 NOTE — Op Note (Signed)
[place]. [facility]  Patient:    Maria Gaines, Maria Gaines Visit Number: [id number] MRN: [id number]          Service Type: DSU Location: [facility] Attending Physician:  Janalyn Rouse Dictated by:   Rose Phi. Maple Hudson, M.D. Proc. Date: 03/06/02 Admit Date:  03/06/2002   CC:         Valentino Hue. Magrinat, M.D.   Operative Report  PREOPERATIVE DIAGNOSIS:  Stage II carcinoma of the left breast.  POSTOPERATIVE DIAGNOSIS:  Stage II carcinoma of the left breast.  OPERATION PERFORMED:  Port-A-Cath insertion.  SURGEON:  Rose Phi. Maple Hudson, M.D.  ANESTHESIA:  MAC.  DESCRIPTION OF PROCEDURE:  The patient was placed on the operating table with a roll between the shoulders and the right upper chest and neck prepped and draped in the usual fashion.  Under local anesthesia a right subclavian puncture was carried out without difficulty and the guide wire inserted and proper positioning confirmed by fluoroscopy.  Under local anesthesia, a transverse incision on the upper chest wall was made and pocket developed over the implantable port.  We then tunneled between the subclavian puncture site and the pocket and passed the catheter through this and flushed the system.  I then connected the export Bard port to the catheter and then placed it in the pocket and anchored it with two 2-0 Prolene sutures. The catheter tip was then trimmed to go to the about the fourth interspace. The dilator and peel-away sheath were passed over the wire and the wire was then removed followed by the dilator and the catheter passed through the peel-away sheath and then it was removed.  Proper position of the catheter tip was confirmed by fluoroscopy and that there was no kinking in the system.  We then closed the incisions with 3-0 Vicryl and subcuticular 4-0 Monocryl and Steri-Strips.  The system was then flushed and aspirated and then fully heparinized and left accessed for chemotherapy tomorrow.   Dressing was then applied and the patient transferred to the recovery room in satisfactory condition, having tolerated the procedure well. Dictated by:   Rose Phi. Maple Hudson, M.D. Attending Physician:  Janalyn Rouse DD:  03/06/02 TD:  03/07/02 Job: [id number]

## 2011-01-16 NOTE — H&P (Signed)
[place]. [facility]  Patient:    Maria Gaines                      MRN: [id number] Adm. Date:  [id number] Attending:  Sanjuana Letters Dictator:   Delano Metz, M.D.                         History and Physical  CHIEF COMPLAINT:  Difficulty breathing.  HISTORY OF PRESENT ILLNESS:  Ms. Maria Gaines is a 60 year old female with a long history of asthma.  She presents with a two-day history of increased respiratory difficulty with wheezing and tightness.  Her recent exacerbation was started by upper respiratory tract symptoms including congestion.  She states she has been unable to "clear" her secretions recently.  Her peak flows at home prior to presenting were 125.  The patient was given three 5 mg albuterol treatments in the emergency department with only minimal relief.  She was also given one dose of prednisone.  PAST MEDICAL HISTORY:  1. Anxiety.  2. Panic attacks.  3. Tobacco abuse.  4. Depression.  5. Carpal tunnel.  6. Asthma.  7. GERD.  MEDICATIONS:  1. Albuterol MDI q.4h. p.r.n.  2. Colace 100 mg b.i.d.  3. Combivent 2 puffs b.i.d.  4. Flovent 220 mcg, 4 puffs b.i.d.  5. Nasonex 1 q.d.  6. Persantine 50 mg b.i.d.  7. Prempro 0.625 mg/5 mg 1 p.o. q.d.  8. Prevacid 30 mg b.i.d.  9. Restoril 50 mg p.o. q.h.s. 10. Theophylline 450 mg b.i.d. 11. Zyrtec 10 mg q.d. 12. Zyrtec 2 puffs b.i.d.  ALLERGIES:  The patient has intolerances to ASPIRIN and CAFFEINE.  FAMILY HISTORY:  Significant for leukemia, asthma, diabetes mellitus, and lung cancer.  SOCIAL HISTORY:  The patient smokes a pack per day.  She does not drink alcohol, does not use other drugs.  REVIEW OF SYSTEMS:  Negative for depressive symptoms.  Cardiovascular: No palpitations or chest pain.  Respiratory: Positive for shortness of breath and wheezing.  Skin: No rash.  GI: No diarrhea or constipation.  Neurological: No numbness or tingling.  Musculoskeletal: No joint  pain.  HEENT: No visual changes, no ear pain.  GU: No hematuria.  The patient does have mild dysuria. Hematologic: No easy bruising.  PHYSICAL EXAMINATION:  VITAL SIGNS:  Blood pressure 127.84, temperature 99.0, pulse 107, respirations 6, O2 saturation 86% initially, increased to 89% and then dropped to 87%.  GENERAL:  The patient is in mild respiratory distress.  She is alert and oriented x 3, a pleasant woman.  HEENT:  Pupils are equal, round and reactive to light.  Extraocular muscles intact. The patient has dentures on top, poor dentition on the bottom.   Oropharynx is mildly erythematous, no exudate.  NECK:  Supple.  No thyromegaly, no lymphadenopathy.  LUNGS:  Diffuse wheezing throughout with poor air movement.  The patient is using her accessory muscles.  CARDIOVASCULAR:  Heart S1, S2, mildly tachycardic.  No murmurs, gallops, or rubs.  MUSCULOSKELETAL:  The patient has clubbing on the digits.  GI:  Positive bowel sounds, obese, nontender, nondistended.  No obvious hepatosplenomegaly.  NEUROLOGIC:  Cranial nerves II-XII grossly intact.  Sensation grossly intact.  LABORATORY DATA:  White blood cell count 20.7, hemoglobin 16.9, hematocrit 52.4, platelets 335, ANC 9.2.  Sodium 137, potassium 4.7, chloride 100, bicarb 30, BUN 12, creatinine 0.7, glucose 141, calcium 8.8.  Theophylline level was 14.0.  Chest  x-ray shows chronic bronchitic changes.  UA is negative.  ASSESSMENT/PLAN:   A 60 year old female with acute asthma exacerbation.  1. Asthma.  Will continue with q.1h. negative treatments.  Will also give    prednisone 60 mg p.o. q.d.  Will admit for monitoring of her respiratory    status and to evaluate her peak flows. 2. Tobacco abuse.  We will encourage the patient to discontinue smoking. 3. Reflux.  Will continue Prevacid. 4. Elevated white count.  Consider starting an antibiotic.  This may be the    precipitating event for the asthma  exacerbation. DD:  07/29/99 TD:  07/29/99 Job: [id number]

## 2011-01-16 NOTE — Discharge Summary (Signed)
NAME:  Maria Gaines, Maria Gaines                        ACCOUNT NO.:  [id number]   MEDICAL RECORD NO.:  [id number]                   PATIENT TYPE:  INP   LOCATION:  5035                                 FACILITY:  [facility]   PHYSICIAN:  Sibyl Parr. Fields, M.D.                DATE OF BIRTH:  01/08/1951   DATE OF ADMISSION:  02/09/2003  DATE OF DISCHARGE:  02/11/2003                                 DISCHARGE SUMMARY   PRIMARY DOCTOR:  Nilda Simmer, M.D.   DISCHARGE DIAGNOSIS:  1. Community-acquired pneumonia, right lower lobe pneumonia.  2. Chronic obstructive pulmonary disease.  3. Hypertension.  4. Diabetes mellitus.  5. Tobacco abuse.  6. Hypokalemia.   CONSULTS:  None.   PROCEDURES:  None.   ADMISSION HISTORY OF PRESENT ILLNESS:  The patient is a 60 year old female  with a history of COPD, who presented with a one-week history of shortness  of breath, cough productive of green phlegm, and fever, onset x 1 day.  Also  with dyspnea on exertion.  She had been using her albuterol nebulizers  t.i.d. to q.i.d. when usually at baseline not having to use at all.  She did  use O2 at night.  Prior to coming to the hospital, she had one prior episode  in the 1980s and was admitted at that point in time.  Positive sick contact  with an uncle with a fever one week ago.  The patient was seen in the [facility] ER.  She was given Rocephin and erythromycin x 1.   PERTINENT PHYSICAL EXAMINATION:  Decreased breath sounds on the right base  and fair air movement.  Admission vital signs with temperature 101.4  degrees, respiratory rate 18, 91% on room air, 93% on 2 L O2 nasal cannula,  pulse 129, and blood pressure 114/69.   ADMISSION LABORATORY DATA:  CBC with white blood cell count 14.4, hemoglobin  15.4, platelets 232, 78% neutrophils, 15% lymphs, and 7% monos.  Admission  ISTAT with sodium 132, potassium 3.7, chloride 95, bicarbonate 32, BUN 9,  and glucose 141.  The pH was 7.365 and pCO2 56.6.  The  chest x-ray showed a  right lower lobe pneumonia with COPD and possible cardiac enlargement.   HOSPITAL COURSE:  The patient was admitted for a community-acquired  pneumonia on preexisting COPD.  For the remaining details of the history and  physical, please consult the history and physical on the chart.   #1 - COMMUNITY-ACQUIRED PNEUMONIA:  The patient was started on Rocephin and  azithromycin.  She was continued on oxygen at 2 L nasal cannula.  Her  respiratory status was not fair from baseline, however, secondary to  comorbidities, she was admitted.  On day #2 of hospitalization, her  respiratory status was improved.  It was felt she could go home the next  day.  By day #3 of hospitalization, she was tolerated p.o.  She  had been on  p.o. antibiotics and was responding well.  No true fever, however, low-grade  temperatures overnight in the 100.9 degree range.  She had received two days  of IV Rocephin and azithromycin p.o. the night prior.  She was oxygenating  well anywhere from 92-98% on 2 L of O2 nasal cannula, which is what she is  on at home on occasion and felt well enough to go home.  Discharged on  azithromycin for five days an outpatient in the form of Z-Pak.   #2 - CHRONIC OBSTRUCTIVE PULMONARY DISEASE:  The patient was continued on  her home medications, including theophylline, Combivent, Flovent, Serevent,  albuterol p.r.n.,and O2 nasal cannula at 2 L.  She remained stable  throughout her hospitalization with minimal wheeze, but nothing over  baseline per the patient.  Stable on discharge.   #3 - HYPERTENSION:  Stable throughout hospitalization.  Continued on home  medications.   #4 - DIABETES MELLITUS:  Also stable throughout hospitalization.  She was  continued on her home medications of Glucophage 500 mg p.o. b.i.d. and  Amaryl 4 mg p.o. daily.  CBGs remained overall stable.   #5 - HYPOKALEMIA:  The patient's potassium on admission was 3.7.  However,  on the second  day of hospitalization, it had decreased to 3.3.  This did  correct to 4.3 with supplementation with potassium 120 mEq.  However, after  discussion with the attending as the patient was on triamterene and  hydrochlorothiazide, it was thought she may need to be on 10 mEq of  potassium supplementation each day.  Follow up potassium as an outpatient.   #6 - TOBACCO ABUSE:  Encouraged cessation.  Will continue to stress further  as an outpatient.   DISPOSITION:  The patient was discharged in February 11, 2003, in stable  condition on the following medications.   DISCHARGE MEDICATIONS:  1. Azithromycin Z-Pak.  The patient is to follow the label instructions for     a total of five-day treatment.  2. K-Dur 10 mEq tablet one p.o. daily.  3. The patient is to continue home medications, including albuterol inhaler,     Allegra, Amaryl, calcium supplement, Combivent, Flonase, Flovent,     Glucophage, Lipitor, Maxzide, MiraLax, Plavix, Prevacid, Serevent,     theophylline, Valium, and Tylenol at her outpatient doses.   ACTIVITY:  No restrictions.   DIET:  Diabetic, low-salt diet.   WOUND CARE:  Not applicable.   SPECIAL INSTRUCTIONS:  The patient is to return for increased shortness of  breath.  She is also to continue using 2 L of oxygen at home if needed.    FOLLOWUP:  Follow up with Dr. Katrinka Blazing at the family practice center.  The  patient has a scheduled appointment on February 19, 2003, that she was  instructed to keep.  At that doctor's appointment, should check a potassium  level as she was started on potassium supplementation at discharge.     Silas Sacramento, M.D.                         Sibyl Parr. Darrick Penna, M.D.    Jearld Pies  D:  02/11/2003  T:  02/11/2003  Job:  [id number]   cc:   Nilda Simmer, M.D.  Family Medicine Resident [id number]  Fax: [phone]

## 2011-01-16 NOTE — Discharge Summary (Signed)
NAME:  Maria Gaines, Maria Gaines                        ACCOUNT NO.:  [id number]   MEDICAL RECORD NO.:  [id number]                   PATIENT TYPE:  INP   LOCATION:  5036                                 FACILITY:  [facility]   PHYSICIAN:  Nilda Simmer, M.D.                  DATE OF BIRTH:  15-Oct-1950   DATE OF ADMISSION:  06/25/2003  DATE OF DISCHARGE:  06/29/2003                                 DISCHARGE SUMMARY   DISCHARGE DIAGNOSES:  1. Chronic obstructive pulmonary disease exacerbation.  2. Acute bronchitis.  3. Type II diabetes.  4. Hypertension.  5. Tobacco abuse.  6. Gastroesophageal reflux disease.  7. Hyperlipidemia.   DISCHARGE MEDICATIONS:  1. Albuterol 2.5 mg inhaled q.6h.  2. Atrovent 0.5 inhaled q.6h.  3. Flonase two sprays each nostril each day.  4. Flovent 220 mcg four puffs inhaled twice each day.  5. Lipitor 20 mg p.o. daily.  6. MiraLax 17 g in 8 ounces of water p.o. daily.  7. Plavix 75 mg p.o. daily.  8. Prevacid 30 mg p.o. twice daily.  9. Theophylline 400 mg p.o. twice daily.  10.      Amaryl 4 mg p.o. q.a.m.  11.      Combivent two puffs four times a day.  12.      Lotensin HCTZ combination 10/12.5 mg p.o. daily.  13.      Glucophage 500 mg p.o. twice a day.  14.      Serevent one puff twice a day.  15.      Allegra 60 mg p.o. twice a day.  16.      Calcium daily.  17.      Valium 10 mg q.h.s. as needed for sleep.  18.      Prednisone 30 mg p.o. q.day for seven days.  19.      Avalox 400 mg p.o. daily for two days.   CONSULTATIONS:  None.   PROCEDURES:  Chest x-ray.   HISTORY OF PRESENT ILLNESS:  The patient is a 60 year old female with  history of chronic obstructive pulmonary disease who presented with a two  day history of shortness of breath, cough and green sputum production.  The  day prior to admission, the patient went to [facility] and was  sent home with an antibiotic.  The patient stated that her oxygen saturation  was 97% on room air  at [facility].  The patient had worsening shortness of  breath, cough and black sputum production on the day of presentation at  [facility].  The patient had a burning substernal chest pain with deep  inspiration and used oxygen overnight and Albuterol nebulized treatments the  night before presentation.  The patient was seen in clinic at the [facility] on the day of admission where she was found to be in  mild respiratory distress with cyanotic lips.  She was sent to [facility]  emergency department and was admitted.   PERTINENT PHYSICAL EXAMINATION:  VITAL SIGNS:  Temperature 99, pulse 134,  blood pressure 90/60, oxygen saturation 74% on room air, 86% on 2 liters of  oxygen.  GENERAL APPEARANCE:  Ill-appearing.  HEENT:  Cyanosis of lips.  RESPIRATORY:  Mild respiratory distress with decreased breath sounds at the  bases, prolonged expiratory time, bilateral diffuse wheezing, moderate air  movement.  CARDIOVASCULAR:  Regular rhythm with distant S1, S2.   ADMISSION LABORATORY DATA:  CBC with white blood cell count 12.1, hemoglobin  14.2, platelets 252,000, 69% neutrophils, 18% lymphocytes.  Admission I  stat:  Sodium 132, potassium 3.7, chloride 96, bicarbonate 31, BUN 11,  creatinine 0.7, glucose 119, pH 7.34, PCO2 59.1, PO2 66.0.  Total bilirubin  0.4, AST 19, ALT 15, alkaline phosphatase 91, total protein 6.6, albumin  2.8, Theophyllin level 5.1, creatinine kinase 122, CK MB 5.3, troponin I  0.03.  Chest x-ray showed hyperinflation associated with COPD.  No  infiltrates.  EKG showed sinus tachycardia, left atrial enlargement, T wave  inversion and AVR and AVL, possible R wave progression V1-V3.   HOSPITAL COURSE:  The patient was admitted for a chronic obstructive  pulmonary disease exacerbation and acute bronchitis.  See History and  Physical in chart for additional details.   #1 - ACUTE BRONCHITIS.  The patient had an increased sputum production along   with a change in sputum color to green and black.  Chest x-ray was done to  rule out pneumonia.  Chest x-ray showed no infiltrate.  A sputum culture was  performed which showed abundant white blood cells predominantly PMN's and  Gram negative rods.  The patient was started on Avelox 400 mg daily and was  treated for a seven day course.  The patient had less sputum production on  discharge.   #2 - CHRONIC OBSTRUCTIVE PULMONARY DISEASE EXACERBATION.  The patient was  admitted in mild respiratory distress.  Solu-Medrol 125 mg IV was given.  The patient's home medications, including Albuterol, Atrovent, Flovent,  Serevent, Theophylline were continued.  Oxygen via nasal cannula was also  started to keep oxygen saturations between 88% and 92%.  Prednisone 60 mg  daily was started.  Prednisone was decreased to 40 mg after two days of  treatment.  After three days of hospitalization, the patient started having  confusion and an acute delirium.  Prednisone dose was decreased to 30 mg and  was continued for seven days after discharge for a total of 12 days of  steroid therapy.  The patient was discharged with good oxygen saturations  around 90-93% on 2 liters of oxygen.  The patient was instructed to continue  oxygen therapy at home throughout the day.   #3 - CHEST PAIN.  The patient experienced some substernal chest pain upon  admission.  The patient was placed on a telemetry floor for observation and  cardiac enzymes were cycled.  Cardiac enzymes were found to be negative, and  the patient no longer had any chest pain.  No events were recorded on  telemetry, and the possibility of an acute event was ruled out.   #4 - TYPE II DIABETES MELLITUS.  The patient normally takes Glucophage and  Amaryl at home.  The patient's home medications were initially held upon  hospitalization.  The patient's glucose level did increase secondary to steroid therapy, and the patient's Amaryl home dose was restarted  along with  a sliding  scale insulin regimen as needed.  The patient was discharged on  home doses of Amaryl and Glucophage.   #5 - HYPERTENSION.  The patient normal on Lotensin/HCTZ combination.  This  was held due to admission blood pressure of 90/60.  The patient's blood  pressure increased with fluids and remained normal throughout  hospitalization, and she was discharged on home dose of Lotensin/HCTZ.   #6 - ACUTE DELIRIUM.  After about three days of hospitalization, the patient  experienced confusion and paranoid delusions.  The patient denied alcohol or  benzodiazepine abuse.  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  Prednisone dose was decreased to 30 mg daily.  The  patient improved before discharge.   #7 - TOBACCO ABUSE.  The patient expressed desire to quit smoking.  Pharmacy  discussed options and plans for tobacco cessation with the patient.   #8 - GASTROESOPHAGEAL REFLUX DISEASE.  This was stable, and the patient was  discharged with home dose Prevacid.   #9 - HYPERLIPIDEMIA.  This was stable, and the patient was discharged with  home dose of Lipitor.   DISPOSITION:  The patient was discharged in a stable condition.   ACTIVITY:  As tolerated.   SPECIAL INSTRUCTIONS:  Oxygen therapy throughout the day.   FOLLOWUP:  Follow up was made with Dr. Ailene Ards at the [facility] for Friday, July 06, 2003, at 3:10 p.m.      Royanne Foots, M.D.                      Nilda Simmer, M.D.    AP/MEDQ  D:  07/01/2003  T:  07/01/2003  Job:  [id number]   cc:   Nilda Simmer, M.D.  [facility]  [address] [place]  [place] [address]  Fax: [phone]

## 2011-01-16 NOTE — H&P (Signed)
[place]  Patient:    Maria Gaines, Maria Gaines                     MRN: [id number] Adm. Date:  [id number] Disc. Date: [id number] Attending:  Doneta Public Dictator:   Lyndee Leo. Foreman                         History and Physical  CHIEF COMPLAINT:  Dyspnea.  HISTORY OF PRESENT ILLNESS:  Ms. Maria Gaines is a 60 year old white female with onset of flu symptoms three weeks ago. Last Saturday night, she called the emergency department to get two prednisone tablets to get her through until Monday so she  could be seen at [facility]. However, the patient felt better this ast Monday and did not go to primary M.D. She states that her dyspnea has increased  over the last two days. She has had positive wheezing. She sleeps with one pillow. The patient has been on disability for COPD by Thora Lance, M.D. Not on home oxygen at this time. She has positive night sweats for the last two nights and  tactile fever. No cough. No nausea. No vomiting. No diarrhea. She has been taking liquids without any difficulty. However, it is the increasing dyspnea which has  brought her in to the emergency room today. Last hospitalization was in November 2000. The patient states baseline peak flow is about 150.  REVIEW OF SYSTEMS:  CONSTITUTIONAL:  Tactile fever, sore throat. CARDIAC:  No chest pain, no shortness of breath. RESPIRATORY:  Positive dyspnea. Positive wheezing. GI:  No nausea, vomiting, diarrhea. Good p.o. intake. SKIN:  No rashes. NEUROLOGICAL:  She has had some change in vision. It has been one year since her last eye appointment. PSYCHIATRIC:  She is alert and oriented x 3. GU:  No dysuria. No hematuria. She has had positive polyuria. HEMATOLOGIC:  No dark, tarry stools. No bright red blood per rectum.  PROBLEM LIST:  1. Obesity.  2. Anxiety and panic attacks.  3. Smoking.  4. Depression.  5. Carpal tunnel syndrome.  6. Asthma.  7. Reflux disease.  8. Constipation.  9. Fibroadenosis of the breasts. 10. Vaginal bleeding.  CURRENT MEDICATIONS:  1. Albuterol MDI q.4h. p.r.n.  2. Combivent 2 puffs q.i.d.  3. Flovent 220 mcg 4 puffs b.i.d.  4. Nasonex one q.d.  5. Persantine 50 mg p.o. b.i.d.  6. Premphase.  7. Prevacid 30 mg p.o. b.i.d.  8. Tagamet 400 mg b.i.d. p.r.n.  9. Theophylline 450 mg p.o. b.i.d. 10. Xenical 120 mg p.o. t.i.d. 11. Allegra one tablet p.o. b.i.d.  SOCIAL HISTORY:  Positive for tobacco at one pack a day. Has been smoking over 0 years. The patient is engaged and lives with her boyfriend. She has two grown children and four granddaughters. Denies any alcohol. Denies any drug use.  FAMILY HISTORY:  Positive for leukemia in her mother, asthma, allergies, diabetes, poor circulation, and lung cancer. She has a sister with diabetes mellitus and thyroid disease.  PROCEDURES:  She had a tubal ligation in 1976, partial mastectomy in 1994, tissue pathology was benign.  PHYSICAL EXAMINATION:  VITAL SIGNS:  Temperature 98.1, blood pressure 114/63, respirations are 20, pulse oximetry is 86% on room air, repeat on 2 L was 93%.  GENERAL:  The patient was in mild respiratory distress on presentation in emergency room and received albuterol and Atrovent nebulizers, as well as Solu-Medrol; after  initial treatment became much more comfortable.  HEENT:  Pupils are equal, round, and reactive to light. Extraocular movements intact. TMs are clear bilaterally. Oropharynx is clear. No erythema or exudate. She has dentures on the top. Poor dentition on the bottom.  NECK:  Supple. No lymphadenopathy. She has no JVD.  CARDIOVASCULAR:  Normal S1/S2. No murmurs, rubs, or gallops.  LUNGS:  She has positive wheezing, expiratory greater than inspiratory and on the right greater than the left. No crackles. No retractions. No nasal flaring.  MUSCULOSKELETAL:  Reveals 5/5 strength, 2+ reflexes  throughout. She has 2+ edema in the lower extremities, right greater than left.  GASTROINTESTINAL:  Soft, nontender, nondistended. Positive bowel sounds. No hepatosplenomegaly.  SKIN:  No rashes.  NEUROLOGICAL:  Cranial nerves 2 through 12 are grossly intact.  LABORATORY DATA:  ABG on room air:  pH 7.347, pCO2 59, pO2 45, bicarb 32, base excess 5, O2 saturation was 77%.  Chest x-ray was performed which was consistent with COPD, mild cardiac enlargement, and no active disease or infiltrate.  ASSESSMENT AND PLAN:  1. Dyspnea. Probable chronic obstructive pulmonary disease exacerbation. The     patient was given Solu-Medrol and will change over to p.o. prednisone. Start     her on antibiotics. Continue with albuterol and Atrovent nebulizers. We will do     a nicotine patch for smoking. We will have pharmacy assess patient for     technique of MDIs at home.  2. Smoking cessation. The patient states that she understands that this is killing     her. We will give her education and look at her as a possible candidate for     Zyban.  3. Disability issues. The patient sits at home. Spoke to her at length regarding     this, and she states that she is ready to go back to work and stop sitting     around. The patient feels that she would benefit from activity as well as the     mental challenge. This was greatly encouraged.  4. Diabetes mellitus. Positive family history. The patient is obese. She has had     polyuria and change in vision by history. Monitor CBGs and check her hemoglobin     A1C. A glucose was 141 on her last admission to the hospital. DD:  10/24/99 TD:  10/24/99 Job: [id number]

## 2011-01-16 NOTE — Discharge Summary (Signed)
NAME:  Maria Gaines, Maria Gaines Gaines                       ACCOUNT NO.:  [id number]   MEDICAL RECORD NO.:  [id number]                   PATIENT TYPE:  INP   LOCATION:  5027                                 FACILITY:  [facility]   PHYSICIAN:  Maria Gaines Lor, MD                      DATE OF BIRTH:  12-29-1950   DATE OF ADMISSION:  12/17/2003  DATE OF DISCHARGE:  12/24/2003                                 DISCHARGE SUMMARY   CONSULTING PHYSICIAN:  Maria Gaines Maria Gaines Gaines, M.D. [facility], of [facility].   DISCHARGE DIAGNOSES:  1. Ventilation dependent respiratory failure.  2. Chronic obstructive pulmonary disease.  3. Tobacco abuse.  4. Coronary artery disease.  The patient is status post stent placement two     weeks prior to this admission.  5. History of breast cancer.  6. History of diverticulosis.  7. History of anxiety.   DISCHARGE MEDICATIONS:  1. Ciprofloxacin 750 mg one p.o. b.i.d. for 14 days.  2. Albuterol MDI two puffs every six hours for three days, then two puffs     every four hours as needed for severe shortness of breath.  3. Advair Diskus 250/50 mg one puff every 12 hours; begin this medication     after cessation of albuterol MDI.  4. Ativan 2 mg one p.o. q.8h. p.r.n. for anxiety.  5. Prednisone taper as follows: 60 mg one p.o. b.i.d. for two days, then 40     mg one p.o. b.i.d. for three days, then 40 mg one p.o. daily for three     days, then 20 mg one p.o. daily for three days, then stop.   Additional medications to be resumed as previously done at home include:  1. Allegra.  2. Plavix.  3. Protonix.  4. Lotensin.  5. Zocor.  6. Amaryl.  7. Glucophage.    </PROCEDURES>  1. Portable chest x-ray showed chronic cardiomegaly, no active disease.  2. Status post intubation.  Portable chest x-ray showed endotracheal tube to     be in appropriate position, no acute coronary process.  3. Repeat portable chest x-ray showed worsening right greater than left     basilar aeration maybe  secondary to developing atelectasis or     pneumothorax.  No large effusion seen.  Stable cardiomegaly.     Endotracheal tube in position.  4. Repeat chest x-ray showed worsening of right lower lobe infiltrate.  5. Repeat portable chest x-ray done status post extubation showed right PICC     line insertion with the tip in the right innominate vein, stable     bibasilar atelectasis.  6. Fluoroscopic guided upper extremity PICC line exchange occurred without     incident.  Repeat chest x-ray status post PICC line exchange showed the     PICC line was in the superior vena cava.   HISTORY OF PRESENT ILLNESS:  This is a 60 year old female patient of  Maria Gaines Maria Gaines Gaines, M.D., of [facility] who presented to the [facility]  Emergency Department with two to three day of worsening shortness of air.  She stated that over the prior few days, her dyspnea had grown worse despite  persistent use of albuterol, Flovent, Serevent, and theophylline.  She also  complained of intermittent chest pain that was substernal and sharp lasting  approximately 15 minutes with no relational exertion.  Upon presentation to  the emergency room, the patient was found to be somnolent with an O2  saturation of approximately 67% on room air.  She was placed on BiPAP, given  IV Solu-Medrol and Xopenex nebulizer.  She showed some improvement but  remained somnolent between conversations.  She denied fever, nausea,  vomiting, or abdominal pain.  She denied productive cough.   ADMISSION PHYSICAL:  VITAL SIGNS:  Temperature 99.5, heart rate 133, blood  pressure 97/56, respirations 24, O2 saturations 68% on 2 L by nasal cannula.  This improved to high 80% to low 90% saturation on BiPAP.  GENERAL:  This is a female who appears much older than her stated age in a  mild state of distress on BiPAP, making conversation difficult.  She  appeared to be alert and oriented x 3 although she did demonstrate  progressive  somnolence during her evaluation in the emergency room.  Physical examination was significant for mild increased work of breathing  with a very prolonged respiratory phase and minimal wheeze present.  She had  poor air movement and inspiratory crackles in the right base.  Her heart was  tachycardic.  Mild trace edema in the left ankle.  Nonfocal cranial nerve  exam.   LABORATORY DATA:  White count 10.6, hemoglobin 30.8, platelets 333.  Sodium  140, potassium 4.4, chloride 101, bicarbonate 35, BUN 9, creatinine 0.7,  glucose 116, calcium 9.6.  Point of care enzymes were as follows: Myoglobin  initially 90 followed by a second set that was 48.  CK MB first set 6.1,  second set 5.3, third set 9.4.  Troponin I first set less than 0.05, second  set less than 0.05, third set 0.03.  EKG showed a rate of 131 with right  axis deviation, poor R wave progression with no ST or T wave changes.  Chest  x-ray as described above in procedures.  Additional labs came back after the  patient was admitted were brain natriuretic peptide of 367 and a D-dimer of  1.37.   HOSPITAL COURSE:  1. The patient was admitted to the family practice teaching service for COPD     exacerbation.  She had an ABG in the ER which showed a pH 7.29, pCO2     77.9, pO2 40, 02 saturation of 66%.  Once on BiPAP, the patient's ABG     showed a pH 7.21, pCO2 95, pO2 72, and O2 saturation 89%.  Maria Gaines Maria Gaines Gaines, M.D. [facility], of [facility] was     consulted and evaluated the patient with [place] in the emergency room.  The     patient became tachypneic to a rate of 30, then actually hypopneic to a     rate of 16 with decreased alertness.  Decision was made to electively     intubate.  Following intubation, the patient had an ABG, which showed a     pH 7.27, pCO2 81, pO2 ________ and 02 saturation of 100%.  The patient  was started on doxycycline as a prophylactic measure for COPD    exacerbation, she was  maintained on IV Solu-Medrol 80 mg q.8h., Xopenex     1.25 mg q.6.h.  Ventilator was managed by Dr. Jayme Cloud and FiO2 was     titrated to keep O2 saturation greater than 90%.  On hospital day #3,     weaning protocol was initiated by the critical care physicians.  The     patient was successfully weaned off the ventilator.  She had a sputum     culture which came back positive for Pseudomonas aeruginosa and as such,     she was started on IV cefepime.  The patient demonstrated steady     improvement status post extubation and on post extubation day #4, she was     discharged to home with p.o. antibiotics.  2. Tobacco abuse.  The patient was offered smoking cessation counseling     during her stay here and chose not to use the patch but stated that she     was very interested in quitting smoking and that she would do it on her     own.  3. Diabetes.  The patient was sent home on her home regimen.  Blood sugars     were often elevated during this admission secondary to use of steroids.  4. Anxiety.  The patient was changed from Valium, which she normally took,     to Ativan and discharged on such.   The patient had home health arranged prior to discharge and was scheduled to  follow up with Maria Gaines Maria Gaines Gaines, M.D., on May 4 at 10 a.m.  She was given a  business card by Dr. Jayme Cloud and instructed to follow up on a p.r.n. basis.   ADDITIONAL STUDIES:  Additional studies of interest: Lower extremity Doppler  performed on December 18, 2003, showed no evidence of DVTs, superficial  thrombosis, or Baker's cyst bilaterally.  Discharge labs the day prior to  discharge: Sodium 138, potassium 4.1, chloride 93, bicarbonate 41, glucose  81, BUN 22, creatinine 0.8, calcium 9.5.  White count 11.6, hemoglobin 13.8,  platelets 242.                                                Maria Gaines Lor, MD    TD/MEDQ  D:  01/24/2004  T:  01/27/2004  Job:  [id number]   cc:   Maria Gaines Maria Gaines Gaines, M.D. [facility]   Maria Gaines Maria Gaines Gaines,  M.D.  [facility]  [address] [place]  [place] [address]  Fax: [phone]

## 2011-01-16 NOTE — Op Note (Signed)
NAME:  Maria Gaines, Maria Gaines              ACCOUNT NO.:  [id number]   MEDICAL RECORD NO.:  [id number]          PATIENT TYPE:  INP   LOCATION:  9309                          FACILITY:  [facility]   PHYSICIAN:  Michelle L. Grewal, M.D.DATE OF BIRTH:  1951/06/11   DATE OF PROCEDURE:  07/02/2006  DATE OF DISCHARGE:                                 OPERATIVE REPORT   PREOPERATIVE DIAGNOSIS:  Symptomatic rectocele.   POSTOPERATIVE DIAGNOSIS:  Symptomatic rectocele.   PROCEDURE:  Posterior repair.   SURGEON:  Michelle L. Vincente Poli, M.D.   ANESTHESIA:  Spinal.   SPECIMENS:  None.   ESTIMATED BLOOD LOSS:  Minimal.   COMPLICATIONS:  None.   PROCEDURE:  The patient was taken to the operating room, where spinal is  placed by Dr. Tacy Dura.  She is placed in the lithotomy position.  The patient  is prepped and draped.  Exam under anesthesia reveals a grade 3 rectocele.  She has a small pedunculated external hemorrhoid noted as well.  She had  good support of her bladder and excellent support of her uterus.  Allis  clamps were placed on the perineum and a V-shaped incision was made after  local was infiltrated.  A midline incision was made all the way up the back  wall of the vagina approximately 5 cm from the introitus.  The rectocele was  reduced using sharp and blunt dissection.  The rectovaginal fascia was then  reapproximated in the midline using 2-0 Vicryl interrupted.  An 0 Vicryl was  used to reinforce the perineum in a figure-of-eight stitch.  The redundant  vaginal epithelium was trimmed and then the vaginal epithelium was closed  using 2-0 Vicryl in a continuous running locked stitch.  The perineum was  closed with the same Vicryl.  Packing with Estrace cream was inserted into  the vagina.  I did not remove the external hemorrhoid because I felt like  she might have a problem from this given that I had done such an extensive  posterior repair.  I think she will need to see a general surgeon later  on  to have this removed.  This was discussed with the patient in the operating  room.  A Foley in-and-out catheter was used to empty the bladder.  The  patient tolerated the procedure well.  All sponge, lap, and instrument  counts are correct x2.      Michelle L. Vincente Poli, M.D.  Electronically Signed     MLG/MEDQ  D:  07/02/2006  T:  07/02/2006  Job:  [id number]

## 2011-01-16 NOTE — Cardiovascular Report (Signed)
NAME:  Maria Gaines, Maria Gaines                        ACCOUNT NO.:  [id number]   MEDICAL RECORD NO.:  [id number]                   PATIENT TYPE:  OIB   LOCATION:  6527                                 FACILITY:  [facility]   PHYSICIAN:  Aram Candela. Tysinger, M.D.              DATE OF BIRTH:  Aug 29, 1951   DATE OF PROCEDURE:  11/30/2003  DATE OF DISCHARGE:                              CARDIAC CATHETERIZATION   PROCEDURES:  1. Angioplasty with drug-eluting stent deployment in the proximal right     coronary artery lesion.  2. Angio-Seal of the right femoral artery.   INDICATION FOR PROCEDURES:  This 59 year old female presented with a long  history of decreased exertion tolerance and chest pain and a nuclear study  was positive for reversible ischemia.  She underwent cardiac catheterization  by Dr. Algie Coffer this morning and he found single-vessel coronary artery  disease with a 90% stenosis of the proximal right coronary artery.  After  discussing alternatives with the patient, angioplasty was recommended and we  proceeded with the procedure following his diagnostic cath.   PROCEDURE:  After signing an informed consent, the patient already prepared  on the table with a 6 French introducer sheath in place in the right femoral  artery we selected a 6 [place] JR-4 guide catheter which was advanced to the  root of the aorta.  After engaging the ostium of the right coronary artery  with the tip of the guide catheter, injections were made into the right  coronary artery.  We then advanced a short HDF guide wire into the right  coronary artery and after mild difficulty it was advanced through the  proximal lesion and positioned distally within the artery.  We then selected  a 3.5 x 23-mm Cypher stent deployment system and after proper preparation  this was inserted over the guide wire and positioned within the proximal  right coronary  lesion.  Stent was then deployed with two inflations using  the deployment  balloon with a maximum pressure of 18 atmospheres and maximum  time of 30 seconds.  After the stent was deployed injections were made again  into the right coronary artery showing a mild area distally in the stent  where it is was not fully deployed.  Therefore, we selected a 4.0 x 15-mm  noncompliant balloon which was advanced within the distal portion of the  stent.  Two inflations were made, first at 16 atmospheres for 15 seconds and  the second at 20 atmospheres for 20 seconds.  After this final inflation,  injections again made into the right coronary artery which showed an  excellent angiographic result with 0% residual lesion and normal antegrade  flow.  There was no evidence for dissection or clot.  The patient tolerated  the procedure well and no complications were noted at the end of the  procedure.  The catheter and sheath were removed from the right femoral  artery and hemostasis was easily obtained with an Angio-Seal closure system.   MEDICATIONS GIVEN:  Angio-Seal per pharmacy protocol.   CINE FINDINGS:  Right coronary artery:  Initial injections into the right  coronary artery showed a critical lesion in the proximal segment with two  areas of narrowing.  The first proximally 60% just distal to the first right  coronary artery branch and the second is a focal eccentric 90% stenosis  proximal to the large right ventricular branch.  Further cines showed proper  positioning of the guide wire and balloon catheter with a good balloon form  obtained.  Final injections into the right coronary artery showed an  excellent angiographic result with 0% residual lesion and normal antegrade  flow.  There is no evidence for dissection or clot.   FINAL DIAGNOSES:  1. Successful angioplasty with primary stenting with a drug-eluting stent in     the proximal right coronary artery.  2. Successful Angio-Seal of the right femoral artery.   DISPOSITION:  Will monitor overnight in the EAD and  continue the Plavix 75  mg daily.  Further disposition as per Dr. Algie Coffer.                                               John R. Aleen Campi, M.D.    JRT/MEDQ  D:  11/30/2003  T:  12/01/2003  Job:  [id number]

## 2011-01-16 NOTE — H&P (Signed)
NAME:  Maria Gaines, Maria Gaines                        ACCOUNT NO.:  [id number]   MEDICAL RECORD NO.:  [id number]                   PATIENT TYPE:  INP   LOCATION:  2113                                 FACILITY:  [facility]   PHYSICIAN:  Maria Gaines, M.D.             DATE OF BIRTH:  [birth year]   DATE OF ADMISSION:  12/17/2003  DATE OF DISCHARGE:                                HISTORY & PHYSICAL   CHIEF COMPLAINT:  Shortness of breath.   HISTORY OF PRESENT ILLNESS:  This is a 60 year old female patient of Nilda Simmer, M.D. at [facility] who presents with a two to three-  day history of worsening shortness of breath.  The patient states that over  the last few days, her dyspnea has grown worse despite persistent use of  albuterol, Flovent, and at times supplemental oxygen in addition to Serevent  and Theophylline on an outpatient basis.  The patient also complains of  intermittent chest pain, substernal, sharp, lasting approximately 15  minutes, no relation to exertion.  The patient called EMS around 3:30 p.m.  the day of admission.  In the emergency room, she was found to be somnolent  with an O2 saturation of approximately 67% on room air. She was placed on  BiPAP, given IV Solu-Medrol and Xopenex nebulizer.  She experienced some  improvement and was interactive when questioned, but somnolent between  conversations.  The patient reported a questionable change in volume of  sputum, but not in texture.  She denied fever, she denied nausea, vomiting,  or abdominal pain.  She did admit to occasional headache and occasional  numbness and tingling in the feet.  She denied sore throat or productive  cough.   PAST MEDICAL HISTORY:  1. Diabetes mellitus type 2, uncomplicated.  2. Hypertension.  3. Obesity.  4. Anxiety.  5. Panic attacks.  6. Tobacco abuse.  7. Depressive disorder.  8. Emphysema.  9. Gastroesophageal reflux disease without esophagitis.  10.      Coronary  artery disease.  11.      Hypercholesterolemia.  12.      History of breast cancer with both hormone, chemo, and radiation     therapy.  13.      Allergic rhinitis.  14.      Obesity.  15.      The patient had coronary artery stents placed two weeks ago by Ricki Rodriguez, M.D.  16.      The patient has been diagnosed with breast cancer, multifocal     infiltrating ductal carcinoma and is status post chemotherapy with     Cytoxan and Adriamycin in 2003.  She also received Arimidex hormone     therapy for 5+ years.  She also received radiation therapy for breast     cancer in 2003.  17.      The patient has a history of  diverticulosis diagnosed by     colonoscopy in March of 2004.  18.      The patient was hospitalized in June of 2004 with community     acquired pneumonia and has been hospitalized on a number of other     occasions for either community acquired pneumonia or COPD exacerbation.  19.      The patient had a Cardiolite in March of 2005 which showed an EF of     39%.   MEDICATIONS:  1. Arimidex 1 mg p.o. daily.  2. Albuterol nebulizers q.4h p.r.n.  The patient says she uses it     approximately once a day.  3. Allegra 60 mg p.o. b.i.d.  4. Calcium carbonate one p.o. daily.  5. Flovent four puffs b.i.d.  6. Lotensin 5 mg p.o. daily.  7. MiraLax once a day.  8. Oxygen 1 liter by nasal cannula on a p.r.n. basis for sleep.  9. Patanol two drops to each eye twice a day on an as-needed basis.  10.      Plavix 75 mg p.o. daily.  11.      Prevacid 30 mg p.o. b.i.d.  12.      Serevent Diskus one puff b.i.d.  13.      Theophylline 200 mg one b.i.d.  14.      Valium 10 mg t.i.d.  15.      Zocor 40 mg p.o. daily.  16.      Vitamin E 400 International Units daily.   ALLERGIES:  ASPIRIN, CAFFEINE, ORPHENADRINE, SELECTIVE SEROTONIN RECEPTOR  INHIBITORS, NSAIDS.   FAMILY HISTORY:  Leukemia and asthma, allergies, diabetes, poor circulation,  lung cancer, and a father with  gastric cancer.   SOCIAL HISTORY:  The patient continues to smoke one pack a day, but is  attempting to quit.  He does not drink or do recreational drugs.  She lives  with her husband.  She is disabled secondary to COPD.  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  She has a daughter who lives  in [place].   PHYSICAL EXAMINATION:  VITAL SIGNS:  Temperature 99.5, heart rate 133, blood  pressure 97/56, respirations 24, 68% on 2 liters by nasal cannula, this  improved to the high 80's to low 90's on BiPAP.  GENERAL:  This is a female who appears much older than her stated age in a  mild state of distress with BiPAP mask on.  Difficult to understand the  patient's conversation because of the BiPAP.  The patient appeared to be  alert and oriented x3 despite her increased somnolence between  conversations.  HEENT:  Normal.  LUNGS:  Significant for mild increased work of breathing, very prolonged  expiratory phase with minimal wheeze present, poor air movement, and  inspiratory crackles in the right base.  HEART:  Within normal limits.  The patient had mild trace edema on the left  ankle.  ABDOMEN:  Within normal limits.  NEUROLOGY:  Cranial nerve examination within normal limits.   LABORATORY DATA:  White count 10.6, hemoglobin 13.8, platelets 333.  Differential on white count; 59% neutrophils. Sodium 140, potassium 4.4,  chloride 101, bicarb 35, BUN 9, creatinine 0.7, glucose 116, calcium 9.6.  Point of care enzymes; myoglobin first set 90, second set 48.  CK-MB first  set 6.1, second set 5.3, third set 9.4.  Troponin I first set less than  0.05, second set less than 0.05, third 0.03.  Third set of cardiac enzymes  included a total CK rather than myoglobin which showed 187.   The patient had an EKG which showed a rate of 131 with right axis deviation,  poor R wave progression, but no ST or T wave changes.  Chest x-ray showed  cardiomegaly and no active  disease.   Additional labs that came back after the patient was admitted; brain  natruretic peptide of 367 and a D-dimer of 1.37.   ASSESSMENT:  This 60 year old white female with acute COPD exacerbation.   Problem 1.  COPD exacerbation.  The patient with increased stability on  BiPAP, yet while still in the ER, she demonstrated progressively worse  ABG's.  It should be noted that initial ABG showed a pH of 7.291, pCO2 77.9,  and pO2 40, O2 saturation of 66%.  Once on BiPAP, the patient's ABG was pH  7.205, pCO2 95.3, pO2 72, O2 saturation of 89%.  Three hours later, the  patient had a third ABG which showed a pH of 7.27, pCO2 81, and pO2 325,  with O2 saturation of 100%.  That was after the patient had been intubated.  The patient's mentation while in the ER became less lucid.  She became  tachypneic to a rate of 30 and then actually hypopneic to a rate of 16, thus  prompting the decision to intubate the patient electively.  Danice Goltz,  M.D. [facility] of critical care medicine and Dr. McDiarmid were both consulted and  agreed with this plan of action.  The patient had no clear etiology for her  exacerbation, but she was started on Doxycycline as a prophylactic measure  for community acquired pneumonia, maintained on IV Solu-Medrol 80 mg q.8h,  Xopenex 1.25 mg q.6h. 0.63 mg q.3h p.r.n.  She was managed by Dr. Jayme Cloud  with FIO2 titrated to keep O2 saturation greater than 90%.  The patient was  maintained on sliding scale insulin resistant protocol and Lovenox 1 mg/kg  subcu q.12h for DVT prophylaxis.  Additional ventilator settings included a  tidal volume of 500, rate of 16, FIO2 of 100%, PEEP 5.  Based on the  patient's history of chest pain, cardiac enzymes were cycled to rule out MI  and came back negative as stated above.  Despite elevated D-dimer the  decision was made to hold off on CT to rule out PE and perform lower  extremity Dopplers in the morning.  In addition, the patient  will have a two-  dimensional echocardiogram.      Maria Lor, Maria Gaines                            Maria Gaines, M.D.    TD/MEDQ  D:  12/18/2003  T:  12/19/2003  Job:  [id number]

## 2011-01-16 NOTE — Discharge Summary (Signed)
   NAME:  Maria Gaines, Maria Gaines                        ACCOUNT NO.:  [id number]   MEDICAL RECORD NO.:  [id number]                   PATIENT TYPE:  INP   LOCATION:  0260                                 FACILITY:  [facility]   PHYSICIAN:  Valentino Hue. Magrinat, M.D.            DATE OF BIRTH:  11/02/1949   DATE OF ADMISSION:  04/03/2002  DATE OF DISCHARGE:  04/04/2002                                 DISCHARGE SUMMARY   DISCHARGE DIAGNOSES:  1. Breast cancer, status post left lumpectomy and axillary lymph nodes     dissection for multifocal tumor with one of 12 lymph nodes involved, ER     and ER positive, treated with chemotherapy x2 most recently March 28, 2002.  2. Fever.  3. A fat necrosis of the breast biopsy site.  4. Chronic obstructive pulmonary disease/asthma.  5. Osteoarthritis.  6. Carpal tunnel syndrome.  7. Gastroesophageal reflux disease.  8. History of panic attacks.  9. Intolerance of aspirin.  10.      Diabetes mellitus.   PROCEDURES:  1. Intravenous antibiotics.  2. Surgical consultation with debridement of the wound.   HOSPITAL COURSE:  The patient was admitted with fever in excess of 101,  started on intravenous antibiotics and evaluated by surgery on April 04, 2002.  Dr. Zachery Dakins opened the center of her breast incision and clear  fluid emerged, about 5 cc, consistent with fat necrosis.  The wound was  closed and cultures obtained which remained negative at the time of  discharge.  By April 04, 2002, the patient was feeling considerably better  and was eager to go home.  At the time of discharge, her temperature was  98.0, pulse 89, respiratory rate 20, blood pressure 166/60, room air  saturation 95% and her CBG at 126.   MEDICATIONS:  Her medications at home were the same ones as on admission  with the addition of Keflex 500 mg twice daily.   PAIN MANAGEMENT:  Not applicable.   ACTIVITY:  As tolerated.   DIET:  Unrestricted.   WOUND CARE:  She was to change  her dressing daily and use Neosporin on the  incision after cleaning with alcohol as per surgical instructions.   SPECIAL INSTRUCTIONS:  She was to call for temperature greater than 101,  pain, bleeding or any other problem.    FOLLOW UP:  She will see me on April 10, 2002, as scheduled and she will  call Dr. Zachery Dakins on Friday for a follow-up appointment.                                               Valentino Hue. Magrinat, M.D.    [name]  D:  04/13/2002  T:  04/15/2002  Job:  [id number]

## 2011-01-16 NOTE — Discharge Summary (Signed)
[place]  Patient:    Maria Gaines, Maria Gaines                     MRN: [id number] Adm. Date:  [id number] Disc. Date: [id number] Attending:  Garnette Scheuermann Dictator:   Dorcas Mcmurray, M.D. CC:         Geroge Baseman Spry   Discharge Summary  DISCHARGE DIAGNOSES: 1. Chronic obstructive pulmonary disease. 2. Asthma. 3. Tobacco abuse. 4. Anxiety. 5. Diabetes mellitus type 2.  DISCHARGE MEDICATIONS:  1. Allegra D one tablet p.o. b.i.d. p.r.n.  2. Amaryl 4 mg one tablet p.o. q.d.  3. Combivent two puffs b.i.d.  4. Flonase two sprays in each nostril q.d.  5. Flovent 220 mcg four puffs b.i.d.  6. Humibid LA 600 mg p.o. b.i.d.  7. Lipitor 10 mg p.o. q.d.  8. Persantine 50 mg p.o. b.i.d.  9. Prempro 0.625 mg/5 mg p.o. q.d. 10. Prevacid 30 mg p.o. q.d. 11. Serevent one puff b.i.d. 12. Theophylline 400 mg p.o. b.i.d. 13. Tylenol 650 mg p.o. t.i.d. 14. Triamcinolone 1% cream b.i.d. p.r.n. 15. Ventolin MDI q.4h. p.r.n. 16. Zoloft 25 mg p.o. q.d. 17. Prednisone taper 40 mg p.o. x 2 days, then 20 mg p.o. x 2 days, then 10 mg     p.o. x 2 days. 18. Azithromycin 500 mg p.o. q.d. x 6 days.  LABORATORY DATA AND X-RAY FINDINGS:  Chest x-ray on April 11, 2001, revealed stable changes of COPD with no acute abnormalities.  On April 11, 2001, ABG with pH 7.436, pCO2 45.6, pO2 51.0, bicarb 31.0 on 86% at room air.  Sodium 138, potassium 3.7, chloride 100, bicarb 29, BUN 4, creatinine 0.7, glucose 76.  Total bilirubin 0.5, Alk phos 74, AST 19, ALT 18, total protein 7.4, albumin 3.5, calcium 9.6.  Theophylline level 8.6.  White blood count 17.6, hemoglobin 16.1, hematocrit 47.8, platelets 253.  CONSULTATIONS:  None.  HISTORY OF PRESENT ILLNESS:  The patient is a 60 year old, [place] female with a history of COPD who presents with several days of increasing shortness of breath and cough productive of greenish tinged sputum.  [name] began experiencing a  decrease in her peak flow meters by measurement at home and decided to present herself for evaluation in the emergency department.  HOSPITAL COURSE:  #1 - SHORTNESS OF BREATH:  The patient presented to the emergency department on April 11, 2001, with increasing shortness of breath and productive cough.  A chest x-ray was obtained at that time which revealed stable changes of COPD with no acute infiltrate indicative of pneumonia.  The patient was started on Solu-Medrol 125 mg IV and nebulizers.  She was also started on Ceftriaxone 1 g q.d. and azithromycin 500 mg q.d.  Overnight, the patient remained afebrile and the next morning stated she was breathing better than she had for quite some time.  Her oxygen saturation overnight was between 92-96% on 2 L nasal cannula.  Her pulmonary exam the next morning consisted of decreased breath sounds bilaterally, but with good air movement.  She did have diffuse wheezing.  During the day, the patient ambulated up and down the hallway with nursing assistance and was saturating 89% on room air after her ambulation.  She stated that she felt great and was ready to leave the hospital.  #2 - HEME POSITIVE STOOLS:  Upon admission, the patient reportedly had a heme positive stool by Hemoccult in the clinic earlier.  However, repeat Hemoccult in  the ED was negative, but visible hemorrhoids were noted which were a probable source for the bleeding.  The patients hemoglobin and hematocrit were stable at 16.1 and 47.8.  This concern can be followed up as an outpatient with her primary physician.  CONDITION ON DISCHARGE:  Stable and improved with patient reportedly breathing much easier.  DISPOSITION:  The patient was discharged to home.  FOLLOWUP:  Follow-up appointment with Dr. Carolyne Fiscal in the [facility] on August 20, at 9:55 a.m.  DIET:  Advised to adhere to the American Diabetic Association diet.  SPECIAL INSTRUCTIONS:  Take all medications as  directed and keep all follow-up appointments.   Dictated by: Rodolph Bong, M.S.-IV DD:  04/12/01 TD:  04/13/01 Job: [id number]

## 2011-01-16 NOTE — Discharge Summary (Signed)
[place]. [facility]  Patient:    Maria Gaines, Maria Gaines                     MRN: [id number] Adm. Date:  [id number] Disc. Date: [id number] Attending:  Doneta Public Dictator:   Lyndee Leo. Janey Greaser, M.D.                           Discharge Summary  ADMISSION DIAGNOSIS:  Chronic obstructive pulmonary disease exacerbation.  HOSPITAL COURSE:  The patient was started on prednisone, albuterol and Atrovent  nebulizers, as well as azithromycin.  The patient did markedly better overnight, and the next day, and was requesting for discharge.  The patient was assessed on the morning and the afternoon of February 23, and noted with marked improvement. The patient had been 90 to 94% on 2 liters.  The patient continued to take p.o.  well.  PHYSICAL EXAMINATION ON DISCHARGE: LUNGS:  She still had expiratory wheezes, however, much improved air movement. Did not have any wheezing with inspiration, no crackles or consolidations or egophony on exam. ABDOMEN:  Soft, nontender, nondistended, with positive bowel sounds. EXTREMITIES:  No clubbing, cyanosis, or edema.  LABORATORY DATA:  The patient had a comprehensive metabolic panel with sodium 137, potassium 4.1, chloride 99, CO2 31, glucose 109, BUN 8, creatinine 0.7, calcium  9.0, total protein 6.7, albumin 3.4, AST 15, ALT 15, alkaline phosphatase 71, total bilirubin was 0.4.  CBC showed white count to be 12.3, hemoglobin 16.1, hematocrit 50.1, platelets were 263.  Cholesterol 263, triglycerides 208, HDL 57, LDL 164,  total cholesterol HDL ratio was 4.6.  Theophylline level was 8.4.  DISCHARGE DIAGNOSIS:  Chronic obstructive pulmonary disease exacerbation.  PLAN: 1. Chronic obstructive pulmonary disease, the patients number one priority needs    to be smoking cessation.  Discussed this at length with her.  She seems at this    point like she is ready to stop.  If the patient does stop smoking, her  theophylline will need to be monitored and brought down to a lower dose as the    levels will increase with the cessation.  We will continue with steroids and  have her continue on a slow wean, as well as finish her course of antibiotics.  We will have home health evaluate her for home oxygen therapy.  This will also    need to largely be based on her successfullness with smoking cessation, as she    would be at an increased risk for hazard with the smoking and the oxygen    therapy. 2. Question of diabetes, positive family history, positive polyuria, positive    changes in vision.  Her hemoglobin A1C was checked and was 6.7.  Her glucose on    her CMET was 109.  This will continue to be monitored as the patient is at    increased risks considering family history and poor exercise and nutrition. 3. Cholesterol.  The patient had increased cholesterol levels.  Her AST and ALT  were within normal limits.  I think she would probably be a good candidate to    start on a lipid lowering agent such as a Statin.  We will have primary M.D.  assess this on an outpatient basis. DD:  10/24/99 TD:  10/25/99 Job: [id number]

## 2011-01-16 NOTE — H&P (Signed)
NAME:  Maria Gaines, Maria Gaines                        ACCOUNT NO.:  [id number]   MEDICAL RECORD NO.:  [id number]                   PATIENT TYPE:  EMS   LOCATION:  ED                                   FACILITY[facility]  [facility]   PHYSICIAN:  Valentino Hue. Magrinat, M.D.            DATE OF BIRTH:  11/02/1949   DATE OF ADMISSION:  04/02/2002  DATE OF DISCHARGE:                                HISTORY & PHYSICAL   HISTORY OF PRESENT ILLNESS:  The patient is a 60 year old [place] woman,  status post left lumpectomy and axillary lymph node dissection in 5/03, for  a multifocal breast cancer with 1:12 lymph nodes positive, ER and PR  positive, her-2U negative.  She is being treated with chemotherapy and  received her second cycle on 03/28/02.   She says she has had low-grade fevers since the time of the most recent  chemotherapy, but only in the last 24 hours has the temperature gone over  101 degrees.  She was hoping to be able to stay home, but family couldn't  take it, I was feeling too cold,  and came to the emergency room where she  was found indeed to be febrile, and is being admitted for further evaluation  and intravenous antibiotics.   PAST MEDICAL HISTORY:  1. History of chronic obstructive pulmonary disease/asthma.  2. Depression/anxiety/history of panic attacks.  3. History of tobacco abuse.  4. History of osteoarthritis/carpal tunnel syndrome.  5. History of gastroesophageal reflux disease.  6. History of chronic constipation.   ALLERGIES:  She is intolerant to ASPIRIN.   PRESENT MEDICATIONS:  1. Serevent 2 puffs b.i.d.  2. Flovent 220 4 puffs b.i.d.  3. Persantine 50 mg b.i.d.  4. Prevacid 30 mg b.i.d.  5. Theophylline 450 mg b.i.d.  6. Allegra 60 mg b.i.d.  7. Hydrochlorothiazide 25 mg q.d.  8. Amaryl 2 mg q.d.  9. Coumadin 1 mg q.d.  10.      Multivitamin q.d.   REVIEW OF SYMPTOMS:  This is really nonfocal except for the fact that she  has minimal purulence at the site of  dehiscence in the left breast.  There  have been no unusual headaches, nausea, vomiting, visual changes, although  she has been a little bit dizzy, possibly secondary to nausea medications.  She is always wheezy, but her shortness of breath has not been worse than  usual.  There has been no phlegm, no pleurisy, and no hemoptysis.  She  continues to be constipated, again, not a new problem for her.  There has  been no dysuria and no rash.   PHYSICAL EXAMINATION:  VITAL SIGNS:  She is a middle-aged white female with  a temperature of 101.9, pulse 114, respiratory rate 20, and blood pressure  of 130/66.  Room air saturation was 97%.  HEENT:  The pupils are equal, round, and reactive.  The sclerae are not  icteric.  The oropharynx  is clear with minimal bilateral cheilitis.  NECK:  Supple, no thyromegaly, no peripheral adenopathy.  BREASTS:  Right breast unremarkable.  Left breast is status post lumpectomy  and anterior lymph node dissection which has a coating of yellowish necrotic  material, but no expressible purulence.  LUNGS:  Wheezes bilaterally, no crackles.  HEART:  Regular rate and rhythm.  ABDOMEN:  Soft, nontender, positive bowel sounds, port intact, no  tenderness, no erythema.  NEUROLOGIC:  Nonfocal.   LABORATORY DATA:  Pending.   IMPRESSION AND PLAN:  A 60 year old [place] woman, status post left  lumpectomy with axillary lymph node dissection, with multifocal breast  cancer with 1:12 lymph node involved, ER and PR positive, HER-2U negative,  being treated with chemotherapy, most recent dose on 03/28/02, now with fever  in excess of 101 degrees.  The only focal finding being minimal necrotic  debris at the base of the area of dehiscence in the left breast lumpectomy  site.   The plan is to admit, draw cultures, and chest a chest x-ray as well as an  urinalysis, and start broad spectrum antibiotics pending labs.  We will  likely give the patient Neulasta with the next  cycle of chemotherapy.  Other  problems are stable at this point.                                               Valentino Hue. Magrinat, M.D.    [name]  D:  04/02/2002  T:  04/07/2002  Job:  [id number]   cc:   Rose Phi. Maple Hudson, M.D.  Fax: [phone]

## 2011-01-16 NOTE — Op Note (Signed)
[place]. [facility]  Patient:    Maria Gaines, Maria Gaines Visit Number: [id number] MRN: [id number]          Service Type: DSU Location: [facility] Attending Physician:  Janalyn Rouse Dictated by:   Rose Phi. Maple Hudson, M.D. Proc. Date: 01/20/02 Admit Date:  01/20/2002 Discharge Date: 01/20/2002                             Operative Report  PREOPERATIVE DIAGNOSIS:  Carcinoma of the left breast with lymph node involvement.  POSTOPERATIVE DIAGNOSIS:  Carcinoma of the left breast with lymph node involvement.  OPERATION PERFORMED: 1. Left axillary lymph node dissection. 2. Re-excision of previous lumpectomy site.  SURGEON:  Rose Phi. Maple Hudson, M.D.  ANESTHESIA:  General.  DESCRIPTION OF PROCEDURE:  After suitable general anesthesia was induced, the patient was placed in the supine position with the left arm extended on the arm board.  The left breast and axilla were prepped and draped in the usual fashion.  We opened up the sentinel node incision and extended it both medially and laterally to the pectoralis major muscle anteriorly and to the latissimus dorsi muscle posteriorly.  I dissected down to the pectoralis major muscle and with appropriate retraction, we dissected along the major and retracted it and exposed the minor and dissected along this up to the clavipectoral fascia overlying the axillary vein.  We incised that and exposed the axillary vein and then took all of the tissue out from inferior to the vein and from behind the pectoralis minor and the rest of the axilla.  This gave a level 1 and level 2 lymph node dissection.  The long thoracic and thoracodorsal nerves were identified and preserved.  Other cutaneous branches were clipped and divided along with the vessels.  Following the completion of this dissection, we had good  hemostasis.  We thoroughly irrigated the field with saline.  A 19 [place] Blake drain was inserted and brought out through a  separate stab wound. Subcutaneous tissues closed with 3-0 Vicryl and the skin with staples.  We then turned our attention to the lumpectomy site in the 3 oclock position of the left breast.  I then incised, excising the scar and then, using the cautery, excised the whole cavity to get a cleaner margin and we never did enter the cavity.  Again, hemostasis obtained with the cautery.  We then stapled the skin.  Dressings applied.  The patient was transferred to the recovery room in satisfactory condition having tolerated the procedure well. Dictated by:   Rose Phi. Maple Hudson, M.D. Attending Physician:  Janalyn Rouse DD:  01/20/02 TD:  01/23/02 Job: [id number]

## 2011-01-16 NOTE — Op Note (Signed)
[place]. [facility]  Patient:    Maria Gaines, Maria Gaines Visit Number: [id number] MRN: [id number]          Service Type: DSU Location: [facility] Attending Physician:  Janalyn Rouse Dictated by:   Rose Phi. Maple Hudson, M.D. Proc. Date: 12/12/01 Admit Date:  12/12/2001   CC:         Dr. Geroge Baseman. Spry   Operative Report  PREOPERATIVE DIAGNOSIS:  Stage 1 carcinoma of the left breast.  POSTOPERATIVE DIAGNOSIS:  Stage 1 carcinoma of the left breast.  OPERATION PERFORMED: 1. Blue dye injection. 2. Left partial mastectomy with needle localization and specimen mammography. 3. Left sentinel lymph node biopsy.  SURGEON:  Rose Phi. Maple Hudson, M.D.  ANESTHESIA:  General.  DESCRIPTION OF PROCEDURE:  After suitable general anesthesia was induced, the patient was placed in supine position with the left arm extended on the arm board.  Prior to coming to the operating room, a localization wire had been placed and she had 1 mCi of technetium sulfur colloid injected intradermally. After she was asleep, we injected 5 cc of Lymphazurin blue.  The left breast and axilla were then prepped and draped.  A curved incision using the wire centered on a lesion that would be in the 3 oclock position of the left breast was then made and a wide excision of the wire and surrounding tissue was carried out.  Specimen was oriented for the pathologist and then submitted for specimen mammography which confirmed removal of the lesion.  While the pathologist was evaluating this, a short transverse left axillary incision was made with dissection through the subcutaneous tissues to the clavipectoral fascia.  We then incised the clavipectoral fascia and just deep to that was a very hot and blue lymph node and actually there were two of them together.  We removed those and submitted them as sentinel nodes.  The sentinel nodes turned out to be negative.  The margins on the lumpectomy specimen were  also negative.  The incisions were closed with 3-0 Vicryl and subcuticular 4-0 Monocryl and Steri-Strips.  Dressings applied.  The patient was transferred to the recovery room in satisfactory condition having tolerated the procedure well. Dictated by:   Rose Phi. Maple Hudson, M.D. Attending Physician:  Janalyn Rouse DD:  12/12/01 TD:  12/12/01 Job: [id number]

## 2011-01-24 ENCOUNTER — Other Ambulatory Visit: Payer: Self-pay | Admitting: Family Medicine

## 2011-01-27 ENCOUNTER — Other Ambulatory Visit: Payer: Self-pay | Admitting: Family Medicine

## 2011-02-02 ENCOUNTER — Other Ambulatory Visit: Payer: Self-pay | Admitting: Family Medicine

## 2011-02-02 NOTE — Telephone Encounter (Signed)
Pt checking status of refill request for her asthma meds, says [facility] was suppose to send request last week.

## 2011-02-02 NOTE — Telephone Encounter (Signed)
Dr. [name], I called pharmacy to make sure that they have sent request over. They stated that they have sent a request for patient's Ipratropium. I could not find the request. Can you please fill this for patient if able to. ---[name]

## 2011-02-03 MED ORDER — IPRATROPIUM-ALBUTEROL 0.5-2.5 (3) MG/3ML IN SOLN: RESPIRATORY_TRACT | Status: DC

## 2011-02-04 ENCOUNTER — Other Ambulatory Visit: Payer: Self-pay | Admitting: Family Medicine

## 2011-02-05 ENCOUNTER — Other Ambulatory Visit: Payer: Self-pay | Admitting: Family Medicine

## 2011-02-09 ENCOUNTER — Other Ambulatory Visit: Payer: Self-pay | Admitting: Family Medicine

## 2011-02-09 DIAGNOSIS — E7800 Pure hypercholesterolemia, unspecified: Secondary | ICD-10-CM

## 2011-02-09 MED ORDER — ATORVASTATIN CALCIUM 80 MG PO TABS: 80.0000 mg | ORAL_TABLET | Freq: Every day | ORAL | Status: DC

## 2011-02-13 ENCOUNTER — Inpatient Hospital Stay (INDEPENDENT_AMBULATORY_CARE_PROVIDER_SITE_OTHER)
Admission: RE | Admit: 2011-02-13 | Discharge: 2011-02-13 | Disposition: A | Discharge status: Home / Self Care | Payer: Medicare Other | Source: Ambulatory Visit | Attending: Family Medicine | Admitting: Family Medicine

## 2011-02-13 ENCOUNTER — Telehealth: Payer: Self-pay | Admitting: Family Medicine

## 2011-02-13 DIAGNOSIS — Z760 Encounter for issue of repeat prescription: Secondary | ICD-10-CM

## 2011-02-13 DIAGNOSIS — F411 Generalized anxiety disorder: Secondary | ICD-10-CM

## 2011-02-13 NOTE — Telephone Encounter (Signed)
Pt completely out of her Lorazepam.  Need refill asap.

## 2011-02-16 MED ORDER — LORAZEPAM 2 MG PO TABS: 2.0000 mg | ORAL_TABLET | Freq: Three times a day (TID) | ORAL | Status: DC

## 2011-02-16 NOTE — Telephone Encounter (Signed)
Refill request

## 2011-02-16 NOTE — Telephone Encounter (Signed)
Lynn Please cal this in as above THANKS! [name]

## 2011-02-16 NOTE — Telephone Encounter (Signed)
RX called to pharmacy and patient notified. 

## 2011-02-16 NOTE — Telephone Encounter (Signed)
Maria Gaines had to go to Urgent Care this weekend and received a rx for just 9 pills for her Lorazepam.  She is unable to sleep without it.  Please have provider send it new refill for this to her pharmacy asap

## 2011-03-09 DIAGNOSIS — H5340 Unspecified visual field defects: Secondary | ICD-10-CM

## 2011-03-09 HISTORY — DX: Unspecified visual field defects: H53.40

## 2011-03-10 ENCOUNTER — Encounter: Payer: Self-pay | Admitting: Family Medicine

## 2011-03-10 NOTE — Progress Notes (Signed)
Visual defectin right eye per Dr Eden Emms. He plans recheck 6 m

## 2011-03-17 ENCOUNTER — Telehealth: Payer: Self-pay | Admitting: Family Medicine

## 2011-03-17 NOTE — Telephone Encounter (Signed)
[name] called today to make an appt to see Dr. [name] asap, but the first opening wasn't until 8/1.  She did not want to schedule it out that far and requested for Dr. [name] to call her about several personal things she has going on.

## 2011-03-17 NOTE — Telephone Encounter (Signed)
Dr. [name], Spoke with patient she states that she has been having memory issues and they are increasing lately, would like some medicine to help her remember. She also said that there were a couple other things with her health that she needed to talk to you about, BUT she can't remember. She says this memory thing is very embarrassing, she is having problems remembering her husbands name. Would like to know what you would recommend. ---[name]

## 2011-03-17 NOTE — Telephone Encounter (Signed)
Dear [name] Team I am hospital service--see if you can find out what is going on [facility]! [name]

## 2011-03-18 NOTE — Telephone Encounter (Signed)
Dear [name] Team Please schedule her with either me at my first available (Ok dbl book) OR see if there is a spot in  geriatric clinic. She has had issues with this before and it is usually stress interfering with her memory--not alzheimers.  TELL  her I said that and not to worry and I will see her in clinic or we can ger her in [place] clinic if that is first [facility]! [name]

## 2011-03-23 ENCOUNTER — Other Ambulatory Visit: Payer: Self-pay | Admitting: Family Medicine

## 2011-03-24 ENCOUNTER — Other Ambulatory Visit: Payer: Self-pay | Admitting: Family Medicine

## 2011-03-26 ENCOUNTER — Ambulatory Visit: Payer: Medicare Other | Admitting: Family Medicine

## 2011-04-01 ENCOUNTER — Ambulatory Visit: Payer: Medicare Other | Admitting: Family Medicine

## 2011-04-02 ENCOUNTER — Ambulatory Visit (INDEPENDENT_AMBULATORY_CARE_PROVIDER_SITE_OTHER): Payer: Medicare Other | Admitting: Family Medicine

## 2011-04-02 ENCOUNTER — Encounter: Payer: Self-pay | Admitting: Family Medicine

## 2011-04-02 VITALS — BP 118/70 | Temp 98.4°F | Ht 63.5 in | Wt 164.0 lb

## 2011-04-02 DIAGNOSIS — J449 Chronic obstructive pulmonary disease, unspecified: Secondary | ICD-10-CM

## 2011-04-02 DIAGNOSIS — F319 Bipolar disorder, unspecified: Secondary | ICD-10-CM

## 2011-04-02 DIAGNOSIS — R413 Other amnesia: Secondary | ICD-10-CM

## 2011-04-02 DIAGNOSIS — I5022 Chronic systolic (congestive) heart failure: Secondary | ICD-10-CM

## 2011-04-02 NOTE — Progress Notes (Signed)
  Subjective:    Patient ID: Maria Gaines, female    DOB: 05-Sep-1950, 60 y.o.   MRN: [id number]  HPI Patient that comes with the complaint of loss of memory that has intensified for the last 4 months. She has problems remembering medications and friends names. No problems with long term memory.This does not interfere with ADL or IADLs. She is not taking her Risperidone because has denial of her diagnosis but does have auditory and visual hallucinations non command type. She does not seem agitated and mood and affect are appropriate.    Also she complaints about urinary incontinency that happens only while she is asleep with a frequency of 2 -3 times in a week.  During the day she does not report incontinency. Pt with Hx of COPD that take several medications to control her symptoms and seems that she is not clear of what to take and when now asymptomatic.       Review of Systems Per HPI    Objective:   Physical Exam  Constitutional: She is oriented to person, place, and time. She appears well-developed and well-nourished. No distress.  HENT:  Head: Normocephalic and atraumatic.  Right Ear: External ear normal.  Left Ear: External ear normal.       Audiometry within normal limits  Neck: Neck supple.  Cardiovascular: Normal rate, regular rhythm and normal heart sounds.   Pulmonary/Chest: Effort normal and breath sounds normal. She has no wheezes.  Abdominal: Soft. Bowel sounds are normal.  Musculoskeletal: She exhibits no edema.  Neurological: She is alert and oriented to person, place, and time. Coordination normal.  Psychiatric: She has a normal mood and affect. Her behavior is normal. Judgment and thought content normal.       MMS 28/30 negative.          Assessment & Plan:

## 2011-04-02 NOTE — Assessment & Plan Note (Signed)
On multiple medications that can contribute with memory changes (anticholinergic). Recommended appt for Pharmacy Clinic to manage better.

## 2011-04-02 NOTE — Patient Instructions (Addendum)
Has been a pleasure to meet you. Please take Risperidone ( 2mg ) 1/2 tablet per night every night for a week. Then increase to 1 tablet at night. Furosemide change to (40 mg) take 2  tablets of 20 mg daily in the morning. Do not take this medication after 2:00 pm Make an appointment for the Pharmacy Clinic and bring ALL your medications with you that day.

## 2011-04-02 NOTE — Assessment & Plan Note (Signed)
well controlled Furosemide dosing moved to AM only to prevent enuresis

## 2011-04-02 NOTE — Assessment & Plan Note (Signed)
Normal MMS, suspected distraction from her psychiatric illness. Was not compliant with risperidone. We advise to take meds as prescribed and f/u.

## 2011-04-02 NOTE — Assessment & Plan Note (Signed)
Advised patient to take her Risperidol since hearing voices likely contributes to her memory deficits.

## 2011-04-03 ENCOUNTER — Telehealth: Payer: Self-pay | Admitting: Family Medicine

## 2011-04-03 NOTE — Telephone Encounter (Signed)
Says that she called in about needing some refills, the ones that Dr. Sheffield Slider spoke of in her appt with him yesterday.  There were 4 in all.  She was very confused and her memory was very poor so she could not remember whether she was here for an appt or if she called in.  I saw that 2 meds were discussed, they were Risperidone and Furosemide.

## 2011-04-03 NOTE — Telephone Encounter (Signed)
Forward to dr. Sheffield Slider due to this being discussed during a visit with him

## 2011-04-06 ENCOUNTER — Emergency Department (HOSPITAL_COMMUNITY): Payer: Medicare Other

## 2011-04-06 ENCOUNTER — Emergency Department (HOSPITAL_COMMUNITY)
Admission: EM | Admit: 2011-04-06 | Discharge: 2011-04-06 | Disposition: A | Discharge status: Home / Self Care | Payer: Medicare Other | Attending: Emergency Medicine | Admitting: Emergency Medicine

## 2011-04-06 ENCOUNTER — Ambulatory Visit: Payer: MEDICARE

## 2011-04-06 DIAGNOSIS — I509 Heart failure, unspecified: Secondary | ICD-10-CM | POA: Insufficient documentation

## 2011-04-06 DIAGNOSIS — R509 Fever, unspecified: Secondary | ICD-10-CM | POA: Insufficient documentation

## 2011-04-06 DIAGNOSIS — E7800 Pure hypercholesterolemia, unspecified: Secondary | ICD-10-CM | POA: Insufficient documentation

## 2011-04-06 DIAGNOSIS — Z9981 Dependence on supplemental oxygen: Secondary | ICD-10-CM | POA: Insufficient documentation

## 2011-04-06 DIAGNOSIS — Z79899 Other long term (current) drug therapy: Secondary | ICD-10-CM | POA: Insufficient documentation

## 2011-04-06 DIAGNOSIS — E119 Type 2 diabetes mellitus without complications: Secondary | ICD-10-CM | POA: Insufficient documentation

## 2011-04-06 DIAGNOSIS — R0609 Other forms of dyspnea: Secondary | ICD-10-CM | POA: Insufficient documentation

## 2011-04-06 DIAGNOSIS — I2510 Atherosclerotic heart disease of native coronary artery without angina pectoris: Secondary | ICD-10-CM | POA: Insufficient documentation

## 2011-04-06 DIAGNOSIS — R05 Cough: Secondary | ICD-10-CM | POA: Insufficient documentation

## 2011-04-06 DIAGNOSIS — R059 Cough, unspecified: Secondary | ICD-10-CM | POA: Insufficient documentation

## 2011-04-06 DIAGNOSIS — J189 Pneumonia, unspecified organism: Secondary | ICD-10-CM | POA: Insufficient documentation

## 2011-04-06 DIAGNOSIS — Z853 Personal history of malignant neoplasm of breast: Secondary | ICD-10-CM | POA: Insufficient documentation

## 2011-04-06 DIAGNOSIS — R062 Wheezing: Secondary | ICD-10-CM | POA: Insufficient documentation

## 2011-04-06 DIAGNOSIS — R0602 Shortness of breath: Secondary | ICD-10-CM | POA: Insufficient documentation

## 2011-04-06 DIAGNOSIS — R61 Generalized hyperhidrosis: Secondary | ICD-10-CM | POA: Insufficient documentation

## 2011-04-06 DIAGNOSIS — R079 Chest pain, unspecified: Secondary | ICD-10-CM | POA: Insufficient documentation

## 2011-04-06 DIAGNOSIS — R0989 Other specified symptoms and signs involving the circulatory and respiratory systems: Secondary | ICD-10-CM | POA: Insufficient documentation

## 2011-04-06 DIAGNOSIS — I10 Essential (primary) hypertension: Secondary | ICD-10-CM | POA: Insufficient documentation

## 2011-04-06 LAB — TROPONIN I
Troponin I: <0.3 ng/mL (ref <0.30)
Troponin I: <0.3 ng/mL (ref <0.30)

## 2011-04-06 LAB — DIFFERENTIAL
Basophils Absolute: 0 10*3/uL (ref 0.0–0.1)
Basophils Relative: 0 % (ref 0–1)
Eosinophils Absolute: 0.2 10*3/uL (ref 0.0–0.7)
Monocytes Absolute: 0.5 10*3/uL (ref 0.1–1.0)
Neutro Abs: 4.1 10*3/uL (ref 1.7–7.7)
Neutrophils Relative %: 61 % (ref 43–77)

## 2011-04-06 LAB — BASIC METABOLIC PANEL
GFR calc Af Amer: >60 mL/min (ref >60)
GFR calc non Af Amer: >60 mL/min (ref >60)
Glucose, Bld: 182 mg/dL — ABNORMAL HIGH (ref 70–99)
Potassium: 3.8 mEq/L (ref 3.5–5.1)
Sodium: 136 mEq/L (ref 135–145)

## 2011-04-06 LAB — CBC
Hemoglobin: 14.3 g/dL (ref 12.0–15.0)
MCHC: 33.6 g/dL (ref 30.0–36.0)
Platelets: 240 10*3/uL (ref 150–400)

## 2011-04-07 ENCOUNTER — Ambulatory Visit
Admission: RE | Admit: 2011-04-07 | Discharge: 2011-04-07 | Disposition: A | Discharge status: Home / Self Care | Payer: Medicare Other | Source: Ambulatory Visit | Attending: Oncology | Admitting: Oncology

## 2011-04-07 DIAGNOSIS — Z1231 Encounter for screening mammogram for malignant neoplasm of breast: Secondary | ICD-10-CM

## 2011-04-13 ENCOUNTER — Other Ambulatory Visit: Payer: Self-pay | Admitting: Family Medicine

## 2011-04-13 MED ORDER — FUROSEMIDE 20 MG PO TABS: 20.0000 mg | ORAL_TABLET | Freq: Two times a day (BID) | ORAL | Status: DC

## 2011-04-13 MED ORDER — GABAPENTIN 300 MG PO CAPS: 300.0000 mg | ORAL_CAPSULE | Freq: Two times a day (BID) | ORAL | Status: DC

## 2011-04-13 MED ORDER — BENAZEPRIL HCL 5 MG PO TABS: 5.0000 mg | ORAL_TABLET | Freq: Every day | ORAL | Status: DC

## 2011-04-19 ENCOUNTER — Other Ambulatory Visit: Payer: Self-pay | Admitting: Family Medicine

## 2011-04-20 ENCOUNTER — Telehealth: Payer: Self-pay | Admitting: Family Medicine

## 2011-04-20 ENCOUNTER — Other Ambulatory Visit: Payer: Self-pay | Admitting: Obstetrics and Gynecology

## 2011-04-20 NOTE — Telephone Encounter (Signed)
Dr. [name], This patient's list of medications is long what should I do. Should I have her contact pharmacy and have them explain to her or should she come in for a visit to go over the medications? -----[name]

## 2011-04-20 NOTE — Telephone Encounter (Signed)
Wants to speak with someone about having what her medications are for put on her bottle.  It is very confusing to keep up with.  Or a list of each medication and what it is for would be helpful.

## 2011-04-20 NOTE — Telephone Encounter (Signed)
LVM for patient to call back. Patient needs to make an appointment with pharmacy clinic/ [place] to discuss this medications

## 2011-04-21 NOTE — Telephone Encounter (Signed)
Spoke with patient and she has an appointment with Maria Gaines on 9/4 @ 9:45am

## 2011-04-28 ENCOUNTER — Other Ambulatory Visit: Payer: Self-pay | Admitting: Family Medicine

## 2011-04-28 MED ORDER — THEOPHYLLINE 300 MG PO TB12: 300.0000 mg | ORAL_TABLET | Freq: Two times a day (BID) | ORAL | Status: DC

## 2011-04-30 ENCOUNTER — Ambulatory Visit (INDEPENDENT_AMBULATORY_CARE_PROVIDER_SITE_OTHER): Payer: Medicare Other | Admitting: Family Medicine

## 2011-04-30 VITALS — BP 109/73 | HR 94 | Ht 62.6 in | Wt 165.0 lb

## 2011-04-30 DIAGNOSIS — R413 Other amnesia: Secondary | ICD-10-CM

## 2011-04-30 DIAGNOSIS — F29 Unspecified psychosis not due to a substance or known physiological condition: Secondary | ICD-10-CM

## 2011-04-30 DIAGNOSIS — E119 Type 2 diabetes mellitus without complications: Secondary | ICD-10-CM

## 2011-04-30 DIAGNOSIS — R410 Disorientation, unspecified: Secondary | ICD-10-CM

## 2011-04-30 DIAGNOSIS — C50919 Malignant neoplasm of unspecified site of unspecified female breast: Secondary | ICD-10-CM

## 2011-04-30 LAB — TSH: TSH: 1.695 u[IU]/mL (ref 0.350–4.500)

## 2011-04-30 LAB — COMPREHENSIVE METABOLIC PANEL
ALT: 13 U/L (ref 0–35)
AST: 16 U/L (ref 0–37)
Albumin: 4.7 g/dL (ref 3.5–5.2)
Alkaline Phosphatase: 75 U/L (ref 39–117)
Potassium: 4.3 mEq/L (ref 3.5–5.3)
Sodium: 138 mEq/L (ref 135–145)
Total Protein: 7.3 g/dL (ref 6.0–8.3)

## 2011-04-30 LAB — VITAMIN B12: Vitamin B-12: 282 pg/mL (ref 211–911)

## 2011-04-30 NOTE — Patient Instructions (Addendum)
Here is your medicine list---I want you to put all of your medicines in a bag and bring them in to see me at an office visit in 2-3 weeks.  I will send you a note about your lab work I will have a scan done of your head

## 2011-04-30 NOTE — Progress Notes (Signed)
Informed pt of appt to have MRI done at [facility] [employer] @ 1130 08.31.2012. Pt cannot were any jewelry or hair pins for imaging, and will need to have someone drive her to appt.  labs not performed here she will have to go to [facility] center. Pt agreed.  Will inform Dr. [name] that she will need a sedative to be able to have exam done.   Called and spoke with Ms. Maria Gaines at [facility] pt's MRI is approved. Approval # is [id number].Maria Gaines

## 2011-05-01 ENCOUNTER — Telehealth: Payer: Self-pay | Admitting: Family Medicine

## 2011-05-01 ENCOUNTER — Ambulatory Visit
Admission: RE | Admit: 2011-05-01 | Discharge: 2011-05-01 | Disposition: A | Discharge status: Home / Self Care | Payer: Medicare Other | Source: Ambulatory Visit | Attending: Family Medicine | Admitting: Family Medicine

## 2011-05-01 DIAGNOSIS — C50919 Malignant neoplasm of unspecified site of unspecified female breast: Secondary | ICD-10-CM

## 2011-05-01 DIAGNOSIS — R413 Other amnesia: Secondary | ICD-10-CM

## 2011-05-01 DIAGNOSIS — R410 Disorientation, unspecified: Secondary | ICD-10-CM

## 2011-05-01 MED ORDER — GADOBENATE DIMEGLUMINE 529 MG/ML IV SOLN
15.0000 mL | Freq: Once | INTRAVENOUS | Status: AC | PRN
  Administered 2011-05-01: 15 mL via INTRAVENOUS

## 2011-05-01 NOTE — Telephone Encounter (Signed)
Spoke with [name] at [facility] to obtain prior auth# for MRI with and w/o contrast   auth # [id number].Laureen Ochs, Viann Shove   Called pt and informed her of the approval for MRI.Laureen Ochs, Viann Shove

## 2011-05-04 NOTE — Progress Notes (Signed)
  Subjective:    Patient ID: Maria Gaines, female    DOB: 03-08-1951, 60 y.o.   MRN: [id number]  HPI  Complaining of memory issues. Since this has been increasing over the last 6 months. Issues seem to be mostly of forgetfulness about daily things. Also some confusion at times. She denies hallucinations at this time. Says she's not been taking her Risperdal for many months. She had told Dr. Sheffield Slider that she was occasionally hearing voices but today she denies that she told him that. She's not clear why she was ever given Risperdal. Says she saw a psychiatrist wants and was "joking around with him" and he thought she was "crazy".  Is using her oxygen at home all the time by her report but notably she does not have it on today.  She denies having problems around the house with her ADLs. She knows how to use the microwave and the coffee pot and the TV. She is not getting lost when she drives. She says she just feels "fuzzy" much of the time and her husband has noticed a change as well.  She is quite sure something serious is wrong with her and she is going to have to have a brain operation.  Review of Systems Denies fevers sweats or chills. No unexpected weight change. Denies headaches or visual changes.    Objective:   Physical Exam  GENERAL: Well-developed female no acute distress. She seems somewhat sleepy today. HEENT: Pupils equal round reactive to light and accommodation. Extraocular muscles intact. There is no nystagmus. Sclera is nonicteric. CV: Regular rate and rhythm without murmur gallop or rub. I hear no bruits in the neck over the carotid arteries. THYROID: No thyromegaly or nodularity. PSYCH: Somewhat defensive when we talk about her existing diagnosis of bipolar disorder. She otherwise answers and asked questions appropriately. Her affect is a little flat. LUNGS: Distant breath sounds but clear to auscultation bilaterally. Notably her pulse ox at rest on her finger is 87%. This was  repeated on her OA she has something a polish on and on her toes her pulse ox was 83%  [place] cognitive assessment test score 15/30. I will scan Mr. Theotis Burrow    Assessment & Plan:  Mental status issues. I have discussed this with her before and she recently was evaluated in geriatric clinic. Today we gave her the [facility] cognitive assessment test. She scored 15/30. This is significantly different from her previous score on the Mini-Mental status of 28/30. I suspect her hypoxia is the main issue. I did agree to get an MRI of her head she does have history of breast cancer and this is a significant decline in test results even given the fact that it's a slightly different test. I'll see her back in 2 weeks.  When I see her back we will address again whether or not she is taking her risk for bowel. I have asked her to bring every single medication she has in her medicine cabinet with her at that time so we can do a thorough review of her medications. She certainly has polypharmacy but has appeared to be well controlled up until this point. I would like to get rid of some of these medications.

## 2011-05-05 ENCOUNTER — Encounter: Payer: Self-pay | Admitting: Family Medicine

## 2011-05-05 ENCOUNTER — Ambulatory Visit: Payer: Medicare Other | Admitting: Pharmacist

## 2011-05-12 LAB — HEAVY METALS, BLOOD: Mercury, B: <4

## 2011-05-13 ENCOUNTER — Emergency Department (HOSPITAL_COMMUNITY)
Admission: EM | Admit: 2011-05-13 | Discharge: 2011-05-13 | Disposition: A | Discharge status: Home / Self Care | Payer: Medicare Other | Attending: Emergency Medicine | Admitting: Emergency Medicine

## 2011-05-13 ENCOUNTER — Emergency Department (HOSPITAL_COMMUNITY): Payer: Medicare Other

## 2011-05-13 DIAGNOSIS — R0609 Other forms of dyspnea: Secondary | ICD-10-CM | POA: Insufficient documentation

## 2011-05-13 DIAGNOSIS — I10 Essential (primary) hypertension: Secondary | ICD-10-CM | POA: Insufficient documentation

## 2011-05-13 DIAGNOSIS — R05 Cough: Secondary | ICD-10-CM | POA: Insufficient documentation

## 2011-05-13 DIAGNOSIS — E7800 Pure hypercholesterolemia, unspecified: Secondary | ICD-10-CM | POA: Insufficient documentation

## 2011-05-13 DIAGNOSIS — Z79899 Other long term (current) drug therapy: Secondary | ICD-10-CM | POA: Insufficient documentation

## 2011-05-13 DIAGNOSIS — J449 Chronic obstructive pulmonary disease, unspecified: Secondary | ICD-10-CM | POA: Insufficient documentation

## 2011-05-13 DIAGNOSIS — E119 Type 2 diabetes mellitus without complications: Secondary | ICD-10-CM | POA: Insufficient documentation

## 2011-05-13 DIAGNOSIS — R059 Cough, unspecified: Secondary | ICD-10-CM | POA: Insufficient documentation

## 2011-05-13 DIAGNOSIS — J4489 Other specified chronic obstructive pulmonary disease: Secondary | ICD-10-CM | POA: Insufficient documentation

## 2011-05-13 DIAGNOSIS — R062 Wheezing: Secondary | ICD-10-CM | POA: Insufficient documentation

## 2011-05-13 DIAGNOSIS — Z853 Personal history of malignant neoplasm of breast: Secondary | ICD-10-CM | POA: Insufficient documentation

## 2011-05-13 DIAGNOSIS — R0989 Other specified symptoms and signs involving the circulatory and respiratory systems: Secondary | ICD-10-CM | POA: Insufficient documentation

## 2011-05-13 DIAGNOSIS — R0602 Shortness of breath: Secondary | ICD-10-CM | POA: Insufficient documentation

## 2011-05-13 DIAGNOSIS — I2510 Atherosclerotic heart disease of native coronary artery without angina pectoris: Secondary | ICD-10-CM | POA: Insufficient documentation

## 2011-05-13 DIAGNOSIS — I509 Heart failure, unspecified: Secondary | ICD-10-CM | POA: Insufficient documentation

## 2011-05-20 ENCOUNTER — Ambulatory Visit: Payer: Medicare Other | Admitting: Family Medicine

## 2011-05-27 ENCOUNTER — Ambulatory Visit: Payer: Medicare Other | Admitting: Family Medicine

## 2011-05-29 ENCOUNTER — Ambulatory Visit: Payer: Medicare Other | Admitting: Family Medicine

## 2011-06-03 ENCOUNTER — Ambulatory Visit (INDEPENDENT_AMBULATORY_CARE_PROVIDER_SITE_OTHER): Payer: Medicare Other | Admitting: Pulmonary Disease

## 2011-06-03 ENCOUNTER — Encounter: Payer: Self-pay | Admitting: Pulmonary Disease

## 2011-06-03 VITALS — BP 102/70 | HR 95 | Temp 98.2°F | Wt 164.8 lb

## 2011-06-03 DIAGNOSIS — J449 Chronic obstructive pulmonary disease, unspecified: Secondary | ICD-10-CM

## 2011-06-03 DIAGNOSIS — Z9981 Dependence on supplemental oxygen: Secondary | ICD-10-CM

## 2011-06-03 DIAGNOSIS — F410 Panic disorder [episodic paroxysmal anxiety] without agoraphobia: Secondary | ICD-10-CM

## 2011-06-03 DIAGNOSIS — Z23 Encounter for immunization: Secondary | ICD-10-CM

## 2011-06-03 NOTE — Patient Instructions (Signed)
Flu shot  Spiriva Rx  Sent - Take this instead of ATROVENT Talk to Dr Allyson Sabal about stopping LISINOPRIL Pulmonary rehab - exercise program

## 2011-06-03 NOTE — Progress Notes (Signed)
  Subjective:    Patient ID: Maria Gaines, female    DOB: Jul 15, 1951, 60 y.o.   MRN: [id number]  HPI PCP - [name] neal  60/F, retired Lawyer at [employer], for [employer] of COPD.  INitial OV 6/10 PFTs 6/18 >> severe airway obstruction FEV1 30% without BD response, hyperinflation  She had respiratory failure requiring mechanical ventilation in 4/05, has required oxygen piror to that (? 2004) & done well on a regimen of advair, theophylline, albuterol MDI & nebs & atrovent MDI.   She desaturates on walking to the exam room. L Breast CA--StageII mulitifoc infiltrating ductal 4/06-- Lumpectomy (1 out of 12 nodes positive (2mm focus), opth-- bernstorf ([address]), Arimidex (1mg  q d) therapy for 5+ years (heme/onc), s/p chemo with cytoxan and adriamycin 2003,  s/p radiation for breast CA (magrinot/young) 2003  06/03/2011 Sudden onset dyspnea few days ago that woke her up from sleep ? Panic attacks since '05 when she required mech ventilation CXR 9/12 hyperinflation She reports white phlegm, no fevers, occasional weezing.  She is compliant with O2      Review of Systems Pt denies any significant  nasal congestion or excess secretions, fever, chills, sweats, unintended wt loss, pleuritic or exertional cp, orthopnea pnd or leg swelling.  Pt also denies any obvious fluctuation in symptoms with weather or environmental change or other alleviating or aggravating factors.    Pt denies any increase in rescue therapy over baseline, denies waking up needing it or having early am exacerbations or coughing/wheezing/ or dyspnea      Objective:   Physical Exam  Gen. Pleasant, well-nourished, in no distress ,on O2 ENT - no lesions, no post nasal drip Neck: No JVD, no thyromegaly, no carotid bruits Lungs: no use of accessory muscles, no dullness to percussion, clear without rales or rhonchi  Cardiovascular: Rhythm regular, heart sounds  normal, no murmurs or gallops, no peripheral edema Musculoskeletal: No  deformities, no cyanosis or clubbing        Assessment & Plan:

## 2011-06-04 ENCOUNTER — Ambulatory Visit (INDEPENDENT_AMBULATORY_CARE_PROVIDER_SITE_OTHER): Payer: Medicare Other | Admitting: Family Medicine

## 2011-06-04 ENCOUNTER — Encounter: Payer: Self-pay | Admitting: Family Medicine

## 2011-06-04 VITALS — BP 125/80 | HR 91 | Temp 97.8°F | Ht 67.0 in | Wt 163.7 lb

## 2011-06-04 DIAGNOSIS — F410 Panic disorder [episodic paroxysmal anxiety] without agoraphobia: Secondary | ICD-10-CM

## 2011-06-04 DIAGNOSIS — F319 Bipolar disorder, unspecified: Secondary | ICD-10-CM

## 2011-06-04 DIAGNOSIS — F29 Unspecified psychosis not due to a substance or known physiological condition: Secondary | ICD-10-CM

## 2011-06-04 DIAGNOSIS — E119 Type 2 diabetes mellitus without complications: Secondary | ICD-10-CM

## 2011-06-04 DIAGNOSIS — R410 Disorientation, unspecified: Secondary | ICD-10-CM

## 2011-06-04 MED ORDER — CYANOCOBALAMIN 1000 MCG/ML IJ SOLN
1000.0000 ug | Freq: Once | INTRAMUSCULAR | Status: AC
  Administered 2011-06-04: 1000 ug via INTRAMUSCULAR

## 2011-06-04 MED ORDER — FUROSEMIDE 20 MG PO TABS: 20.0000 mg | ORAL_TABLET | Freq: Every day | ORAL | Status: DC

## 2011-06-04 NOTE — Progress Notes (Signed)
Addended by: Jimmy Footman K on: 06/04/2011 11:15 AM   Modules accepted: Orders

## 2011-06-04 NOTE — Progress Notes (Signed)
Subjective:    Patient ID: Maria Gaines, female    DOB: [birth year], 60 y.o.   MRN: [id number]  HPI  Here with complaint of "not feeling well". Has been going on one to 2 weeks maybe longer. Really can't describe specific symptoms except that she does not want to go out and do things which is very unlike her. She's afraid she'll catch a cold or the flu and it really sick. She has begun using her oxygen full time as I suggested. She thinks her memory issues might be a little bit better. She doesn't really feel depressed but doesn't have a lot of desire to do anything. Denies suicidal or homicidal ideation. Admits that she occasionally hears things like people knocking at the door or a car coming up the driveway and then looks out menses no one. This makes her feel a little uneasy. She hears something and not get the door she thinks it is a "choking" which indicates somebody is going to die. This is part of her [place] culture and history.  She denies seeing thing that is unusual. She admits to hearing voices in her head but really can't tell me what they say. They're not giving her commands. She admits to feeling a little "crazy". Her anxiety is greatly increased. She does not like to be alone. She is feeling overwhelmed.  Has not been taking her risperidone now for some time. Said she thought she saw a psychiatrist several years ago that he misdiagnosed her and she does not think she has bipolar disorder. She's not even sure she ever took Risperdal. She is sure she's not taken in the last 2 months. She's only been taking her  "nerve medicine" (lorazepam) once or twice a day instead of the prescribed 3 times a day. Her appetite is a little bit down but her feeding status essentially the same. Her activity level is decreased.  Review of Systems  Constitutional: Positive for activity change and appetite change. Negative for fever, chills and fatigue.  HENT: Negative for neck pain.   Eyes: Negative for  visual disturbance.  Respiratory: Negative for cough, chest tightness and shortness of breath.   Cardiovascular: Negative for chest pain and palpitations.  Genitourinary: Negative for urgency and vaginal pain.  Musculoskeletal: Negative for joint swelling and gait problem.  Skin: Negative for rash.  Neurological: Negative for speech difficulty and weakness.  Psychiatric/Behavioral: Positive for confusion, dysphoric mood and decreased concentration. Negative for suicidal ideas, hallucinations, behavioral problems, sleep disturbance and agitation. The patient is nervous/anxious.        Objective:   Physical Exam  Constitutional: She appears well-developed and well-nourished.  Eyes: Conjunctivae and EOM are normal. Pupils are equal, round, and reactive to light.  Neck: Normal range of motion. Neck supple. No thyromegaly present.  Cardiovascular: Normal rate and regular rhythm.   Pulmonary/Chest: Effort normal.  Psychiatric:       Dysphoric mood it appears to be sad. Her speech content is normal. She asks and answers questions appropriately. She is quite knowledgeable about her medications. There is no psychomotor retardation. Thought content is normal essentially. Behavior is normal. There is no flight of ideas.          Assessment & Plan:  #1. Mood disturbance. She has history of diagnosis of bipolar disorder. She has not been taking her Risperdal for some time now. She seems to be having increased anxiety, stomach or phobia, I suspect she is having more auditory hallucinations that she is  really admitting 2. She refuses to go back on Risperdal or anything like that. She does agree to take her "nerve medicine" (lorazepam in (on regular basis for the next one week. We went through her medications one by one. She is not taking anything over-the-counter in addition. I will see her back in one week. I have her surgery get out of the house at least once every other day in the summer with a friend  or with her husband. If things worsen any in her she should call me.  Prevention: Flu shot was given yesterday. I gave her a B12 shot today. I reviewed her lab work which we did in August. I will give her a B12 shot today. Check hemoglobin A1c today.  #3. COPD: She appears to be using her oxygen full time now which is a plus. I applauded that.

## 2011-06-04 NOTE — Patient Instructions (Signed)
We went over your medicines. PLEASE take your "nerve pill" three times a day as we discussed.  I would like  To see you get out of the house and go somewhere with a friend or your husband at least every other day.  I want to see you back in one week. (OK DOUBLE BOOK or put in on Thursday AM clinic) I will send you a note about your blood work. Great to see you!

## 2011-06-05 ENCOUNTER — Telehealth: Payer: Self-pay | Admitting: Pulmonary Disease

## 2011-06-05 ENCOUNTER — Other Ambulatory Visit: Payer: Self-pay | Admitting: Family Medicine

## 2011-06-05 LAB — CBC
HCT: 36.8
MCHC: 33.1
MCV: 88.3
Platelets: 257
Platelets: 281
RBC: 4.17
WBC: 6.4
WBC: 7.4

## 2011-06-05 LAB — I-STAT 8, (EC8 V) (CONVERTED LAB)
BUN: 10
Glucose, Bld: 116 — ABNORMAL HIGH
Hemoglobin: 13.6
Potassium: 3.9
Sodium: 137
pH, Ven: 7.442 — ABNORMAL HIGH

## 2011-06-05 LAB — CK TOTAL AND CKMB (NOT AT ARMC): Relative Index: INVALID

## 2011-06-05 LAB — POCT I-STAT CREATININE
Creatinine, Ser: 0.6
Operator id: 288831

## 2011-06-05 LAB — DIFFERENTIAL
Basophils Relative: 1
Eosinophils Absolute: 0.1
Eosinophils Relative: 2
Lymphs Abs: 2.1
Monocytes Relative: 9

## 2011-06-05 LAB — COMPREHENSIVE METABOLIC PANEL
ALT: 14
AST: 20
Alkaline Phosphatase: 57
CO2: 29
Calcium: 9.7
Chloride: 102
GFR calc Af Amer: >60
GFR calc non Af Amer: >60
Potassium: 4.2
Sodium: 140

## 2011-06-05 LAB — POCT CARDIAC MARKERS
CKMB, poc: 1
Operator id: 288831
Troponin i, poc: <0.05

## 2011-06-05 LAB — BASIC METABOLIC PANEL
BUN: 7
CO2: 30
Chloride: 103
Creatinine, Ser: 0.6

## 2011-06-05 LAB — TSH: TSH: 1.072

## 2011-06-05 LAB — CARDIAC PANEL(CRET KIN+CKTOT+MB+TROPI)
CK, MB: 2.5
Total CK: 72
Total CK: 74

## 2011-06-05 LAB — LIPID PANEL
LDL Cholesterol: 73
Triglycerides: 54
VLDL: 11

## 2011-06-05 MED ORDER — OMEPRAZOLE 40 MG PO CPDR: 40.0000 mg | DELAYED_RELEASE_CAPSULE | Freq: Every day | ORAL | Status: DC

## 2011-06-05 MED ORDER — TIOTROPIUM BROMIDE MONOHYDRATE 18 MCG IN CAPS: 18.0000 ug | ORAL_CAPSULE | Freq: Every day | RESPIRATORY_TRACT | Status: DC

## 2011-06-05 NOTE — Telephone Encounter (Signed)
Pt aware I have sent spiriva rx to the pharmacy since it was not sent at pt's OV. Nothing further was needed

## 2011-06-06 NOTE — Assessment & Plan Note (Signed)
Flu shot  Spiriva Rx  Sent - Take this instead of ATROVENT Talk to Dr Allyson Sabal about stopping LISINOPRIL Pulmonary rehab - exercise program - discussed benefits

## 2011-06-06 NOTE — Assessment & Plan Note (Signed)
On ativan for a long time Consider SSRI

## 2011-06-06 NOTE — Assessment & Plan Note (Signed)
emphasised 24 h use

## 2011-06-10 ENCOUNTER — Ambulatory Visit: Payer: Medicare Other | Admitting: Family Medicine

## 2011-06-11 LAB — DIFFERENTIAL
Basophils Absolute: 0.1
Basophils Relative: 0
Basophils Relative: 1
Lymphocytes Relative: 9 — ABNORMAL LOW
Lymphs Abs: 2.1
Monocytes Absolute: 1.3 — ABNORMAL HIGH
Monocytes Relative: 9
Neutro Abs: 10.4 — ABNORMAL HIGH
Neutro Abs: 10.7 — ABNORMAL HIGH

## 2011-06-11 LAB — BASIC METABOLIC PANEL
Calcium: 9.4
GFR calc Af Amer: >60
GFR calc non Af Amer: >60
Glucose, Bld: 257 — ABNORMAL HIGH
Potassium: 4.5
Sodium: 132 — ABNORMAL LOW

## 2011-06-11 LAB — CARDIAC PANEL(CRET KIN+CKTOT+MB+TROPI)
CK, MB: 1.7
CK, MB: 2.2
CK, MB: 4.8 — ABNORMAL HIGH
Relative Index: INVALID
Relative Index: INVALID
Total CK: 55
Total CK: 67
Total CK: 69
Troponin I: 0.01
Troponin I: 0.02

## 2011-06-11 LAB — COMPREHENSIVE METABOLIC PANEL
Albumin: 3.4 — ABNORMAL LOW
Alkaline Phosphatase: 77
BUN: 10
Calcium: 9.8
Potassium: 4.2
Sodium: 137
Total Protein: 6.7

## 2011-06-11 LAB — CBC
HCT: 36.3
MCHC: 32.5
MCHC: 33.1
Platelets: 259
Platelets: 281
RDW: 16.1 — ABNORMAL HIGH
RDW: 16.3 — ABNORMAL HIGH

## 2011-06-11 LAB — LIPID PANEL
Cholesterol: 139
HDL: 62
Triglycerides: 42

## 2011-06-14 ENCOUNTER — Other Ambulatory Visit: Payer: Self-pay | Admitting: Family Medicine

## 2011-06-17 LAB — DIFFERENTIAL
Basophils Absolute: 0.1
Basophils Relative: 1
Eosinophils Absolute: 0.3
Eosinophils Relative: 4
Lymphocytes Relative: 28
Lymphs Abs: 2
Monocytes Absolute: 0.8 — ABNORMAL HIGH
Monocytes Relative: 11
Neutro Abs: 4.1
Neutrophils Relative %: 56

## 2011-06-17 LAB — CBC
HCT: 35.2 — ABNORMAL LOW
Hemoglobin: 11.1 — ABNORMAL LOW
MCHC: 31.7
MCV: 80.1
Platelets: 362
RBC: 4.39
RDW: 22 — ABNORMAL HIGH
WBC: 7.3

## 2011-06-17 LAB — POCT CARDIAC MARKERS
CKMB, poc: 2.5
Myoglobin, poc: 124
Operator id: 272551

## 2011-06-17 LAB — COMPREHENSIVE METABOLIC PANEL
AST: 18
BUN: 7
CO2: 29
Calcium: 9.8
Chloride: 102
Creatinine, Ser: 0.77
GFR calc Af Amer: >60
GFR calc non Af Amer: >60
Total Bilirubin: 0.3

## 2011-06-17 LAB — COMPREHENSIVE METABOLIC PANEL WITH GFR
ALT: 14
Albumin: 3.8
Alkaline Phosphatase: 83
Glucose, Bld: 120 — ABNORMAL HIGH
Potassium: 4.1
Sodium: 139
Total Protein: 7.5

## 2011-06-17 LAB — D-DIMER, QUANTITATIVE: D-Dimer, Quant: 0.47

## 2011-06-23 ENCOUNTER — Emergency Department (HOSPITAL_COMMUNITY): Payer: Medicare Other

## 2011-06-23 ENCOUNTER — Encounter: Payer: Self-pay | Admitting: Family Medicine

## 2011-06-23 ENCOUNTER — Inpatient Hospital Stay (HOSPITAL_COMMUNITY)
Admission: EM | Admit: 2011-06-23 | Discharge: 2011-06-25 | DRG: 189 | Disposition: A | Discharge status: Home / Self Care | Payer: Medicare Other | Attending: Family Medicine | Admitting: Family Medicine

## 2011-06-23 DIAGNOSIS — J441 Chronic obstructive pulmonary disease with (acute) exacerbation: Secondary | ICD-10-CM | POA: Diagnosis present

## 2011-06-23 DIAGNOSIS — E119 Type 2 diabetes mellitus without complications: Secondary | ICD-10-CM | POA: Diagnosis present

## 2011-06-23 DIAGNOSIS — I2789 Other specified pulmonary heart diseases: Secondary | ICD-10-CM | POA: Diagnosis present

## 2011-06-23 DIAGNOSIS — F319 Bipolar disorder, unspecified: Secondary | ICD-10-CM | POA: Diagnosis present

## 2011-06-23 DIAGNOSIS — Z853 Personal history of malignant neoplasm of breast: Secondary | ICD-10-CM

## 2011-06-23 DIAGNOSIS — D649 Anemia, unspecified: Secondary | ICD-10-CM | POA: Diagnosis present

## 2011-06-23 DIAGNOSIS — E872 Acidosis, unspecified: Secondary | ICD-10-CM | POA: Diagnosis present

## 2011-06-23 DIAGNOSIS — I517 Cardiomegaly: Secondary | ICD-10-CM | POA: Diagnosis present

## 2011-06-23 DIAGNOSIS — J309 Allergic rhinitis, unspecified: Secondary | ICD-10-CM | POA: Diagnosis present

## 2011-06-23 DIAGNOSIS — G4700 Insomnia, unspecified: Secondary | ICD-10-CM | POA: Diagnosis present

## 2011-06-23 DIAGNOSIS — Z9119 Patient's noncompliance with other medical treatment and regimen: Secondary | ICD-10-CM

## 2011-06-23 DIAGNOSIS — K219 Gastro-esophageal reflux disease without esophagitis: Secondary | ICD-10-CM | POA: Diagnosis present

## 2011-06-23 DIAGNOSIS — I509 Heart failure, unspecified: Secondary | ICD-10-CM | POA: Diagnosis present

## 2011-06-23 DIAGNOSIS — I2510 Atherosclerotic heart disease of native coronary artery without angina pectoris: Secondary | ICD-10-CM | POA: Diagnosis present

## 2011-06-23 DIAGNOSIS — R404 Transient alteration of awareness: Secondary | ICD-10-CM | POA: Diagnosis present

## 2011-06-23 DIAGNOSIS — Z9981 Dependence on supplemental oxygen: Secondary | ICD-10-CM

## 2011-06-23 DIAGNOSIS — J189 Pneumonia, unspecified organism: Secondary | ICD-10-CM | POA: Diagnosis present

## 2011-06-23 DIAGNOSIS — F411 Generalized anxiety disorder: Secondary | ICD-10-CM | POA: Diagnosis present

## 2011-06-23 DIAGNOSIS — G8929 Other chronic pain: Secondary | ICD-10-CM | POA: Diagnosis present

## 2011-06-23 DIAGNOSIS — I10 Essential (primary) hypertension: Secondary | ICD-10-CM | POA: Diagnosis present

## 2011-06-23 DIAGNOSIS — J159 Unspecified bacterial pneumonia: Secondary | ICD-10-CM

## 2011-06-23 DIAGNOSIS — Z91199 Patient's noncompliance with other medical treatment and regimen due to unspecified reason: Secondary | ICD-10-CM

## 2011-06-23 DIAGNOSIS — M549 Dorsalgia, unspecified: Secondary | ICD-10-CM | POA: Diagnosis present

## 2011-06-23 DIAGNOSIS — J9620 Acute and chronic respiratory failure, unspecified whether with hypoxia or hypercapnia: Principal | ICD-10-CM | POA: Diagnosis present

## 2011-06-23 DIAGNOSIS — E785 Hyperlipidemia, unspecified: Secondary | ICD-10-CM | POA: Diagnosis present

## 2011-06-23 DIAGNOSIS — E7800 Pure hypercholesterolemia, unspecified: Secondary | ICD-10-CM | POA: Diagnosis present

## 2011-06-23 LAB — CBC
Hemoglobin: 12.7 g/dL (ref 12.0–15.0)
MCH: 29.5 pg (ref 26.0–34.0)
MCHC: 32.5 g/dL (ref 30.0–36.0)
Platelets: 230 10*3/uL (ref 150–400)
RDW: 13.3 % (ref 11.5–15.5)

## 2011-06-23 LAB — DIFFERENTIAL
Basophils Absolute: 0 10*3/uL (ref 0.0–0.1)
Basophils Relative: 0 % (ref 0–1)
Eosinophils Absolute: 0.1 10*3/uL (ref 0.0–0.7)
Eosinophils Relative: 2 % (ref 0–5)
Monocytes Absolute: 0.6 10*3/uL (ref 0.1–1.0)
Monocytes Relative: 8 % (ref 3–12)

## 2011-06-23 LAB — COMPREHENSIVE METABOLIC PANEL
ALT: 9 U/L (ref 0–35)
Albumin: 3.2 g/dL — ABNORMAL LOW (ref 3.5–5.2)
Calcium: 9.9 mg/dL (ref 8.4–10.5)
GFR calc Af Amer: >90 mL/min (ref >90)
Glucose, Bld: 162 mg/dL — ABNORMAL HIGH (ref 70–99)
Sodium: 138 mEq/L (ref 135–145)
Total Protein: 6.3 g/dL (ref 6.0–8.3)

## 2011-06-23 LAB — POCT I-STAT 3, ART BLOOD GAS (G3+)
Bicarbonate: 37.9 mEq/L — ABNORMAL HIGH (ref 20.0–24.0)
O2 Saturation: 96 %
pCO2 arterial: 80.9 mmHg (ref 35.0–45.0)
pO2, Arterial: 100 mmHg (ref 80.0–100.0)

## 2011-06-23 LAB — LIPASE, BLOOD: Lipase: 40 U/L (ref 11–59)

## 2011-06-23 MED ORDER — IOHEXOL 300 MG/ML  SOLN
50.0000 mL | Freq: Once | INTRAMUSCULAR | Status: AC | PRN
  Administered 2011-06-23: 50 mL via INTRAVENOUS

## 2011-06-23 MED ORDER — IOHEXOL 300 MG/ML  SOLN
80.0000 mL | Freq: Once | INTRAMUSCULAR | Status: AC | PRN
  Administered 2011-06-23: 80 mL via INTRAVENOUS

## 2011-06-23 NOTE — H&P (Signed)
Maria Gaines is an 60 y.o. female.   Chief Complaint: SOB and abdominal pain. HPI: Pt is a poor historian, and previous history of asthma and COPD that comes today to the ED c/o SOB and abdominal pain about one day duration. At the ED after being evaluated and symptoms improved her husband asked to get her re-evaluated because she was not acting like herself. She was found to be very disoriented and her explanations were "word salad" like as the ED physician described. ABG was obtained showing primary respiratory acidosis, and it is decided to admit her.  Past Medical History  Diagnosis Date  . Visual field defect 03/09/11    Visual field defect right eye. Dr Cammy Copa optomotrist. plans rechck 6 m  . Hypertension   . Diabetes mellitus   . COPD (chronic obstructive pulmonary disease)   . Breast cancer 2005  CHF with a EF <50% HLD  CAD Anxiety, Panic Attack, Bipolar Disorder. Auditory hallucinations no compliant with risperidone.    No past surgical history on file.  No family history on file. Social History:  reports that she quit smoking about 10 years ago. Her smoking use included Cigarettes. She has a 75 pack-year smoking history. She has never used smokeless tobacco. She reports that she does not drink alcohol. Her drug history not on file.  Allergies:  Allergies  Allergen Reactions  . Aspirin   . Codeine     REACTION: unclear  . Zolpidem Tartrate     REACTION: sleepwalking     Medications Prior to Admission  Medication Sig Dispense Refill  . aspirin 81 MG tablet Take 81 mg by mouth daily.        Marland Kitchen atorvastatin (LIPITOR) 80 MG tablet Take 1 tablet (80 mg total) by mouth at bedtime.  30 tablet  12  . ATROVENT HFA 17 MCG/ACT inhaler       . benazepril (LOTENSIN) 5 MG tablet Take 1 tablet (5 mg total) by mouth daily.  30 tablet  11  . carvedilol (COREG) 6.25 MG tablet Take 6.25 mg by mouth 2 (two) times daily with a meal.       . clopidogrel (PLAVIX) 75 MG tablet Take 75 mg by  mouth daily.        Marland Kitchen diltiazem (CARTIA XT) 180 MG 24 hr capsule Take 180 mg by mouth daily.        . fexofenadine-pseudoephedrine (ALLEGRA-D 12 HOUR) 60-120 MG per tablet Take 1 tablet by mouth 2 (two) times daily as needed.        . Fluticasone-Salmeterol (ADVAIR DISKUS) 250-50 MCG/DOSE AEPB Inhale 1 puff into the lungs 2 (two) times daily.        . furosemide (LASIX) 20 MG tablet Take 1 tablet (20 mg total) by mouth daily.  180 tablet  3  . gabapentin (NEURONTIN) 300 MG capsule Take 1 capsule (300 mg total) by mouth 2 (two) times daily.  60 capsule  12  . glipiZIDE (GLUCOTROL) 5 MG tablet Take 5 mg by mouth daily.        Marland Kitchen ipratropium-albuterol (DUONEB) 0.5-2.5 (3) MG/3ML SOLN 1 treatment every 12 hours SCHEDULED and every 2 hours as needed for coughing or wheezing / shortness of breath  360 mL  11  . LORazepam (ATIVAN) 2 MG tablet Take 1 tablet (2 mg total) by mouth 3 (three) times daily.  90 tablet  5  . metFORMIN (GLUCOPHAGE) 1000 MG tablet Take 1,000 mg by mouth 2 (two) times daily with  meals.        . omeprazole (PRILOSEC) 40 MG capsule Take 1 capsule (40 mg total) by mouth daily.  90 capsule  3  . polyethylene glycol (MIRALAX) powder Take 17 g by mouth as directed. One capful dissolved in 8 ounces of fluid by mouth once daily as needed for constipation. Disp: one month supply. Refill: prn       . potassium chloride (KLOR-CON 10) 10 MEQ CR tablet Take 10 mEq by mouth 2 (two) times daily.        . theophylline (THEOPHYLLINE) 300 MG 12 hr tablet Take 1 tablet (300 mg total) by mouth 2 (two) times daily.  180 tablet  3  . tiotropium (SPIRIVA HANDIHALER) 18 MCG inhalation capsule Place 1 capsule (18 mcg total) into inhaler and inhale daily.  30 capsule  2    Results for orders placed during the hospital encounter of 06/23/11 (from the past 48 hour(s))  DIFFERENTIAL     Status: Normal   Collection Time   06/23/11  9:18 AM      Component Value Range Comment   Neutrophils Relative 66  43 - 77  (%)    Neutro Abs 5.2  1.7 - 7.7 (K/uL)    Lymphocytes Relative 24  12 - 46 (%)    Lymphs Abs 1.9  0.7 - 4.0 (K/uL)    Monocytes Relative 8  3 - 12 (%)    Monocytes Absolute 0.6  0.1 - 1.0 (K/uL)    Eosinophils Relative 2  0 - 5 (%)    Eosinophils Absolute 0.1  0.0 - 0.7 (K/uL)    Basophils Relative 0  0 - 1 (%)    Basophils Absolute 0.0  0.0 - 0.1 (K/uL)   CBC     Status: Normal   Collection Time   06/23/11  9:18 AM      Component Value Range Comment   WBC 7.9  4.0 - 10.5 (K/uL)    RBC 4.30  3.87 - 5.11 (MIL/uL)    Hemoglobin 12.7  12.0 - 15.0 (g/dL)    HCT 16.1  09.6 - 04.5 (%)    MCV 90.9  78.0 - 100.0 (fL)    MCH 29.5  26.0 - 34.0 (pg)    MCHC 32.5  30.0 - 36.0 (g/dL)    RDW 40.9  81.1 - 91.4 (%)    Platelets 230  150 - 400 (K/uL)   COMPREHENSIVE METABOLIC PANEL     Status: Abnormal   Collection Time   06/23/11  9:18 AM      Component Value Range Comment   Sodium 138  135 - 145 (mEq/L)    Potassium 4.5  3.5 - 5.1 (mEq/L)    Chloride 98  96 - 112 (mEq/L)    CO2 34 (*) 19 - 32 (mEq/L)    Glucose, Bld 162 (*) 70 - 99 (mg/dL)    BUN 6  6 - 23 (mg/dL)    Creatinine, Ser 7.82 (*) 0.50 - 1.10 (mg/dL)    Calcium 9.9  8.4 - 10.5 (mg/dL)    Total Protein 6.3  6.0 - 8.3 (g/dL)    Albumin 3.2 (*) 3.5 - 5.2 (g/dL)    AST 13  0 - 37 (U/L)    ALT 9  0 - 35 (U/L)    Alkaline Phosphatase 72  39 - 117 (U/L)    Total Bilirubin 0.3  0.3 - 1.2 (mg/dL)    GFR calc non Af Amer >90  >  90 (mL/min)    GFR calc Af Amer >90  >90 (mL/min)   LIPASE, BLOOD     Status: Normal   Collection Time   06/23/11  9:18 AM      Component Value Range Comment   Lipase 40  11 - 59 (U/L)   POCT I-STAT 3, BLOOD GAS (G3+)     Status: Abnormal   Collection Time   06/23/11  6:04 PM      Component Value Range Comment   pH, Arterial 7.278 (*) 7.350 - 7.400     pCO2 arterial 80.9 (*) 35.0 - 45.0 (mmHg)    pO2, Arterial 100.0  80.0 - 100.0 (mmHg)    Bicarbonate 37.9 (*) 20.0 - 24.0 (mEq/L)    TCO2 40  0 - 100  (mmol/L)    O2 Saturation 96.0      Acid-Base Excess 8.0 (*) 0.0 - 2.0 (mmol/L)    Patient temperature 98.6 F              Ct Head W Wo Contrast  06/23/2011  IMPRESSION: No acute intracranial abnormality.  Original Report Authenticated By: Gwynn Burly, M.D.   Ct Abdomen Pelvis W Contrast  06/23/2011  IMPRESSION: No evidence of bowel obstruction.  1.5 x 1.7 cm enhancing right adrenal lesion.  Dedicated three-phase adrenal protocol CT is suggested for further characterization on a nonemergent basis.  Mildly enlarged upper abdominal lymph nodes, as described above.  Original Report Authenticated By: Charline Bills, M.D.    Review of Systems  Constitutional: Negative for fever and chills.  Eyes: Negative for blurred vision and photophobia.  Respiratory: Positive for wheezing. Negative for cough and sputum production.   Cardiovascular: Negative for chest pain, palpitations and orthopnea.  Gastrointestinal: Positive for abdominal pain. Negative for diarrhea and blood in stool.  Neurological: Negative for dizziness and headaches.  Psychiatric/Behavioral: Negative for suicidal ideas. The patient is nervous/anxious.    Vitals: T 98.6    R 16           HR   93                  BP   135/84                   O2 at 99% on BPAP Physical Exam  Constitutional: She appears well-developed. She appears distressed.       Mild distress due to difficulty breathing and talking while in BPAP   Eyes: Pupils are equal, round, and reactive to light.  Neck: Neck supple.  Cardiovascular: Normal rate, regular rhythm, normal heart sounds and intact distal pulses.   No murmur heard. Respiratory: No stridor. She has decreased breath sounds. She has wheezes in the right lower field and the left lower field. She has no rales.  GI: Soft. Bowel sounds are normal. She exhibits no distension and no mass. There is no tenderness. There is no guarding.  Musculoskeletal: She exhibits no edema.  Lymphadenopathy:     She has no cervical adenopathy.  Neurological: She is alert. She has normal reflexes. No cranial nerve deficit.       It is hard to assess while pt is on BPAP but she is disoriented to time and place. Language is not coherent in content.  Psychiatric: She has a normal mood and affect.     Assessment/Plan 60 y/o F with Hx of Asthma and COPD that comes with SOB, and it is admitted with confusion secondary to most  likely hypercapnia (respiratory acidosis).  Confusion: pt with respiratory chronic conditions, now exacerbated. Decreased breath sounds bilaterally, increased wheezes on physical exam regardless bronchodilator and antiinflammatory treatment. ABG consistent with respiratory acidosis with metabolic compensation that talks about the possible chronicity of her conditions now on the setting of an acute episode of COPD exacerbation. Plan: BPAP, Albuterol, Ipratropium, Prednisone, Abx( Doxycycline) since the use of abx for COPD exacerbations reduces hospital stay.  Abdominal pain: resolved on its own. F/u this abdominal lymphonodes found on CT.    HTN controlled. Plan Resume BP Meds as soon pt can tolerate PO. Monitor in case of IV med needed.  DM Plan: On SSI  CHF. On  ECHO EF of <50%. Pt does not seem volume overload. No peripheral edema. No rales. No signs of third spacing. Plan: BNP and monitor.   FEN/ GI: IVF lock , NPO while on BPAP Prophylaxis: Heparin and Protonix Dispo: Pending Pt improvement.   PILOTO, Aran Menning 06/23/2011, 8:32 PM

## 2011-06-24 ENCOUNTER — Inpatient Hospital Stay (HOSPITAL_COMMUNITY): Payer: Medicare Other

## 2011-06-24 LAB — CBC
HCT: 37.7 % (ref 36.0–46.0)
MCV: 90.8 fL (ref 78.0–100.0)
RBC: 4.15 MIL/uL (ref 3.87–5.11)
WBC: 8.2 10*3/uL (ref 4.0–10.5)

## 2011-06-24 LAB — BASIC METABOLIC PANEL
BUN: 10 mg/dL (ref 6–23)
CO2: 34 mEq/L — ABNORMAL HIGH (ref 19–32)
Chloride: 95 mEq/L — ABNORMAL LOW (ref 96–112)
Creatinine, Ser: 0.49 mg/dL — ABNORMAL LOW (ref 0.50–1.10)
Glucose, Bld: 193 mg/dL — ABNORMAL HIGH (ref 70–99)

## 2011-06-24 LAB — GLUCOSE, CAPILLARY
Glucose-Capillary: 206 mg/dL — ABNORMAL HIGH (ref 70–99)
Glucose-Capillary: 136 mg/dL — ABNORMAL HIGH (ref 70–99)

## 2011-06-24 LAB — POCT I-STAT 3, ART BLOOD GAS (G3+)
Acid-Base Excess: 10 mmol/L — ABNORMAL HIGH (ref 0.0–2.0)
Bicarbonate: 36.5 mEq/L — ABNORMAL HIGH (ref 20.0–24.0)
O2 Saturation: 90 %
pO2, Arterial: 59 mmHg — ABNORMAL LOW (ref 80.0–100.0)

## 2011-06-24 LAB — VITAMIN B12: Vitamin B-12: 361 pg/mL (ref 211–911)

## 2011-06-24 LAB — RPR: RPR Ser Ql: NONREACTIVE

## 2011-06-24 NOTE — H&P (Signed)
Family Medicine Teaching [facility] Admission History and Physical  Patient name: Maria Gaines Medical record number: [id number] Date of birth: [birth year] Age: 60 y.o. Gender: female  Primary Care Provider: [name], MD, MD  Chief Complaint: shortness of breath History of Present Illness: Maria Gaines is a 60 y.o. year old female presenting with shortness of breath and abdominal pain of unknown duration.  At time of examination, patient was wearing Bipap and confused, and therefore history was difficult to obtain.  Per EDP, patient had presented for above symptoms earlier today, brought to ED from home by husband.  Negative workup completed in ED and patient was going to be sent home but then she began exhibiting some mild confusion with word finding difficulties and therefore EDP wanted patient evaluated by FPTS.  Also during this time, ABG was obtained which showed acute on chronic respiratory acidosis and patient was placed on Bipap.  Due to confusion, complete review of systems was unable to be obtained, though patient did deny any abdominal pain, nausea, vomiting, chest pain, palpitations.    Patient Active Problem List  Diagnoses  . BREAST CANCER  . DIABETES MELLITUS II, UNCOMPLICATED  . HYPERCHOLESTEROLEMIA  . BIPOLAR DISORDER  . PANIC ATTACKS  . HYPERTENSION, BENIGN SYSTEMIC  . CORONARY, ARTERIOSCLEROSIS  . CHF - EJECTION FRACTION < 50%  . ALLERGIC RHINITIS  . CHRONIC OBSTRUCTIVE PULMONARY DISEASE, ACUTE EXACERBATION  . COPD  . GASTROESOPHAGEAL REFLUX, NO ESOPHAGITIS  . BACK PAIN, CHRONIC, INTERMITTENT  . INSOMNIA  . OXYGEN-USE OF SUPPLEMENTAL  . Memory change  . Confusion   Past Medical History: Past Medical History  Diagnosis Date  . Visual field defect 03/09/11    Visual field defect right eye. Dr Cammy Copa optomotrist. plans rechck 6 m  . Hypertension   . Diabetes mellitus   . COPD (chronic obstructive pulmonary disease)   . Breast cancer 2005    Past  Surgical History: No past surgical history on file.  Social History: History   Social History  . Marital Status: Married    Spouse Name: N/A    Number of Children: N/A  . Years of Education: N/A   Social History Main Topics  . Smoking status: Former Smoker -- 1.5 packs/day for 50 years    Types: Cigarettes    Quit date: 08/31/2000  . Smokeless tobacco: Never Used  . Alcohol Use: No  . Drug Use: Not on file  . Sexually Active: Not on file   Other Topics Concern  . Not on file   Social History Narrative  . No narrative on file    Family History: No family history on file.  Allergies: Allergies  Allergen Reactions  . Aspirin   . Codeine     REACTION: unclear  . Zolpidem Tartrate     REACTION: sleepwalking    Current Facility-Administered Medications  Medication Dose Route Frequency Provider Last Rate Last Dose  . iohexol (OMNIPAQUE) 300 MG/ML injection 50 mL  50 mL Intravenous Once PRN Medication Radiologist   50 mL at 06/23/11 1836  . iohexol (OMNIPAQUE) 300 MG/ML injection 80 mL  80 mL Intravenous Once PRN Medication Radiologist   80 mL at 06/23/11 1456   Review Of Systems: Per HPI with the following additions: none, see aboe Otherwise 12 point review of systems was performed and was unremarkable.  Physical Exam: Pulse: 98.6  Blood Pressure: 135/84 RR: 16   O2: 99 on Bipap Temp: 98.6  General: alert, cooperative and mild distress  while on Bipap HEENT: PERRLA, extra ocular movement intact, sclera clear, anicteric, oropharynx clear, no lesions and neck supple with midline trachea Heart: S1, S2 normal, no murmur, rub or gallop, regular rate and rhythm Lungs: Diffuse wheezing in all lungs fields.  Somewhat increased work of breathing.   Abdomen: abdomen is soft without significant tenderness, masses, organomegaly or guarding Extremities: extremities normal, atraumatic, no cyanosis or edema Skin:no rashes, no ecchymoses Neurology: normal without focal findings,  PERLA, muscle tone and strength normal and symmetric, reflexes normal and symmetric and sensation grossly normal  Labs and Imaging: Lab Results  Component Value Date/Time   NA 138 06/23/2011  9:18 AM   K 4.5 06/23/2011  9:18 AM   CL 98 06/23/2011  9:18 AM   CO2 34* 06/23/2011  9:18 AM   BUN 6 06/23/2011  9:18 AM   CREATININE 0.44* 06/23/2011  9:18 AM   CREATININE 0.72 04/30/2011 11:50 AM   GLUCOSE 162* 06/23/2011  9:18 AM   Lab Results  Component Value Date   WBC 7.9 06/23/2011   HGB 12.7 06/23/2011   HCT 39.1 06/23/2011   MCV 90.9 06/23/2011   PLT 230 06/23/2011    Assessment and Plan: [name] is a 60 y.o. year old female presenting with likely COPD exacerbation leading to acute on chronic respiratory acidosis: 1. COPD exacerbation:  Patient with known COPD.  ABG shows chronic respiratory acidosis with mild acute component.  Plan to continue patient on Bipap for ventilation.  Continue Albuterol/Atrovent nebulization, she last received this about 4 hours ago.  Will obtain CXR as this has not been done yet.  No fevers, so pneumonia less likely.  Will obtain med rec and contain chronic COPD medications as well.  Start Prednisone as well.   2.  Delirium:  Likely secondary to acute aspect of respiratory acidosis.  No signs of infection.  Abdominal pain has totally resolved.  Husband is not currently present, will need to contact and discuss further with him.  On review of records, there has been some concern by PCP for possible auditory hallucinations.  Unable to assess currently.   3.  HTN:  Resume home bp meds when tolerating PO.   4.  CHF:  Diagnosis of EF < 50% in records.  No signs of fluid overload at this time.  Obtaining BNP as difficulty to assess pulm exam for rales due to wheezing on exam.  No Echo in records.  Assess CXR. 5. Hx/o Bipolar:  Patient took herself off Risperdal.  Defer to PCP.   6. FEN/GI: regular diet when off Bipap 7. Prophylaxis: heparin/protonix as she  is on prednisone 8. Disposition: pending clinical improvement.   Renold Don MD [phone]

## 2011-06-25 LAB — FOLATE RBC: RBC Folate: 954 ng/mL — ABNORMAL HIGH (ref >=366)

## 2011-06-29 ENCOUNTER — Other Ambulatory Visit: Payer: Self-pay | Admitting: Family Medicine

## 2011-06-30 ENCOUNTER — Telehealth: Payer: Self-pay | Admitting: Pulmonary Disease

## 2011-06-30 DIAGNOSIS — J449 Chronic obstructive pulmonary disease, unspecified: Secondary | ICD-10-CM

## 2011-06-30 DIAGNOSIS — Z9981 Dependence on supplemental oxygen: Secondary | ICD-10-CM

## 2011-06-30 NOTE — Telephone Encounter (Signed)
Per SN the saline nasal spray use every hour. Find out what type of setup she is on. If pt does not have a humidifier then order pt a humidifier.

## 2011-06-30 NOTE — Telephone Encounter (Signed)
Called and spoke with pt and informed her of SN's rec.  Pt states she does not have a humidifer for her oxygen therefore will send order to [facility] to order humidifier through [facility].  Pt verbalized understanding and denied any questions.

## 2011-06-30 NOTE — Telephone Encounter (Signed)
I spoke with pt and she states she has been on oxygen for years and starting this past week she has started to have this burning sensation in her nose when she wears her oxygen. Pt states when she blows her nose she will have streaks of blood in it x 1 week. Pt denies any coughing up blood. Pt is requesting further recs. Will forward to Dr. Kriste Basque for further recs since RA is not in the office. Pleaase advise, thanks  Carver Fila, CMA

## 2011-07-01 ENCOUNTER — Ambulatory Visit (INDEPENDENT_AMBULATORY_CARE_PROVIDER_SITE_OTHER): Payer: Medicare Other | Admitting: Family Medicine

## 2011-07-01 ENCOUNTER — Encounter: Payer: Self-pay | Admitting: Family Medicine

## 2011-07-01 ENCOUNTER — Other Ambulatory Visit: Payer: Medicare Other

## 2011-07-01 ENCOUNTER — Telehealth: Payer: Self-pay | Admitting: Family Medicine

## 2011-07-01 DIAGNOSIS — Z9981 Dependence on supplemental oxygen: Secondary | ICD-10-CM

## 2011-07-01 DIAGNOSIS — E119 Type 2 diabetes mellitus without complications: Secondary | ICD-10-CM

## 2011-07-01 DIAGNOSIS — J441 Chronic obstructive pulmonary disease with (acute) exacerbation: Secondary | ICD-10-CM

## 2011-07-01 NOTE — Progress Notes (Signed)
  Subjective:    Patient ID: Maria Gaines, female    DOB: 12/28/1950, 60 y.o.   MRN: [id number]  HPI  Followup recent hospitalization for hypercapnia and confusion. She is feeling much more like herself. She is using her oxygen regularly although she did not bring him into the office with her. She says she has a spur bottle in the car "in case she needs it". She's not sure how she would know if she needed it. She's feeling well without headache. Does not think she's having any problems with confusion or depression. Denies hallucinations. Cannot tell me exactly why she ended up in the hospital or what happened during her hospitalization. She says they are going to set her up at some point with a outpatient appointment for psychiatrist but she's not sure that she needs that. She denies any extreme sadness, denies mood lability. Denies suicidal ideation. Denies homicidal ideation.  Review of Systems    see history of present illness. Objective:   Physical Exam  Vital signs reviewed GENERALl: Well developed, well nourished, in no acute distress. NECK: Supple, FROM, without lymphadenopathy.  THYROID: normal without nodularity CAROTID ARTERIES: without bruits LUNGS: clear to auscultation bilaterally. No wheezes or rales. HEART: Regular rate and rhythm, no murmurs ABDOMEN: soft with positive bowel sounds NEURO: No gross focal deficits        Assessment & Plan:  #1. Recent episode of confusion and hypercapnia. Clearly the hypercapnia complicated issues. It still unclear to me whether or not she was having some psychiatric issues at that time or if it  was all related to her oxygenation or lack thereof. She is 91% on room air today and seems to be totally normal. I would like to see her back in 3-4 weeks. She said she would like to have her daughter meet me so she plans to bring her daughter to that appointment. I did give her a flu shot today.

## 2011-07-01 NOTE — Telephone Encounter (Signed)
Dear [name] Team She needs to come by and give a urinalysis so I know wha tto treat it WITH. I have out in for a UA Please call her and tell her this---se if u can gether to tell u her symptoms THANKS! [name]

## 2011-07-01 NOTE — Telephone Encounter (Signed)
Spoke with pt and she is going to try and come by today to give UA.Loralee Pacas [place]

## 2011-07-01 NOTE — Telephone Encounter (Signed)
Routed to pcp.Maria Gaines Lynetta  

## 2011-07-01 NOTE — Telephone Encounter (Signed)
[name] was in earlier and forgot to mention the most important thing.  And that is that she has a Kidney Infection and it really needs attention.  She would like for Dr. [name] to call in something to treat it.

## 2011-07-02 ENCOUNTER — Telehealth: Payer: Self-pay | Admitting: Family Medicine

## 2011-07-02 NOTE — Telephone Encounter (Signed)
Pt called the emergency line. States that she is calling for a refill on her metformin.  She says that the pharmacy has not heard back from the refill request.  Would like a refill on this medication.  Will forward to PCP.

## 2011-07-03 ENCOUNTER — Other Ambulatory Visit: Payer: Self-pay | Admitting: Family Medicine

## 2011-07-03 ENCOUNTER — Telehealth: Payer: Self-pay | Admitting: Family Medicine

## 2011-07-03 MED ORDER — METFORMIN HCL 1000 MG PO TABS: 1000.0000 mg | ORAL_TABLET | Freq: Two times a day (BID) | ORAL | Status: DC

## 2011-07-03 NOTE — Telephone Encounter (Signed)
Ms. Maria Gaines is calling because she is out of Metformin and is using her husbands and now he is running out too.  She uses [facility] on [address].

## 2011-07-03 NOTE — Telephone Encounter (Signed)
Maria Gaines has taken care of this.[name]

## 2011-07-03 NOTE — Telephone Encounter (Signed)
Addended byDenny Levy L on: 07/03/2011 03:46 PM   Modules accepted: Orders

## 2011-07-05 NOTE — Discharge Summary (Signed)
NAME:  Maria Gaines, Maria Gaines              ACCOUNT NO.:  [id number]  MEDICAL RECORD NO.:  [id number]  LOCATION:  [facility]                         FACILITY:  [facility]  PHYSICIAN:  Leighton Roach Neveah Bang, M.D.DATE OF BIRTH:  07/03/1951  DATE OF ADMISSION:  06/23/2011 DATE OF DISCHARGE:  06/25/2011                              DISCHARGE SUMMARY   DISCHARGE DIAGNOSES: 1. Chronic obstructive pulmonary disease exacerbation. 2. Asthma. 3. Bipolar disorder with confusion. 4. Diabetes mellitus. 5. Congestive heart failure. 6. Coronary artery disease. 7. Hypertension. 8. Hyperlipidemia. 9. Anemia. 10.Anxiety. 11.Pneumonia.  DISCHARGE MEDICATIONS:  New medications that were; 1. Ofloxacin 750 mg one tablets p.o. daily, to complete 15 days of     treatment. 2. Atorvastatin 80 mg one tab p.o. daily. 3. Atrovent 4 puffs inhaled every 2 hours as needed for shortness of     breath. 4. Benazepril 5 mg one tab p.o. daily. 5. Carvedilol 6.25 mg one tab p.o. b.i.d. 6. Clopidogrel 75 mg one tablets p.o. daily. 7. Diltiazem one capsule p.o. daily. 8. Furosemide 20 mg one tablet p.o. b.i.d. 9.Gabapentin 300 mg one capsule p.o. b.i.d. 10.Klor-Con 10 mEq one tab p.o. b.i.d. 11.Lorazepam 2 mg one tablet p.o. t.i.d. as scheduled. 12.Metformin 1000 mg one tab p.o. b.i.d. 13.Omeprazole 40 mg one capsule p.o. daily. 14.Spiriva 18 mcg one capsule inhaled daily. 15.Theophylline 300 mg extended release one capsule p.o. b.i.d. 16.The patient should continue at home with oxygen as prescribed per    pulmonologist.  CONSULTS:  Social worker Psychiatry.  LABS: 1. CBC with white count 7.9, hemoglobin 12.7, and platelet 230. 2. CMET within normal limits. 3. ABG on admission with pH of 7.27, pCO2 of 80.9, and pO2 100 with     bicarb 37.9. 4. BNP 335.3. 5. ABG, pH 7.42.  PERTINENT STUDIES:  CT of abdomen and pelvis with contrast negative.  CT of the head with and without contrast with no acute intracranial abnormality  and chest x-ray that showed bronchitis changes with acute bronchitis versus early bronchopneumonia, stable cardiomegaly and questionable pulmonary artery hypertension.  BRIEF HOSPITAL COURSE:  Mrs. Roger Shelter is a 60 year old female, admitted for hypercapnia secondary to COPD exacerbation with confusion. 1. Hypercapnia.  The patient with severe COPD that returned to     baseline compensated after BiPAP over night on admission date, and     even though her confusion improved.  She was not at the baseline 24     hours after admission.  On the day 3 of admission, the     patient returned to baseline mental health, but we were concerned     about underlying psychiatric disorders.  We put a consult for     Psychiatry, but since the patient was not danger to herself or to     others, is decided to follow up as outpatient, and our Social     Psychiatry saw her and asked her to schedule an appointment with     mental health for followup. 2. Pneumonia.  The patient after her COPD exacerbation was controlled.     Her physical exam was positive for crackles on her right lower     field and this corroborates with chest x-ray  with a questionable     possible pneumonia.  White count was normal, and because of benefit     of antibiotic treatment, we start treatment with doxycycline and     then with transit her to Levaquin, completing course of 14 days of     treatment and follow up as outpatient.  3. COPD exacerbation, she has been prescribed to use oxygen at home, and has it, but she is      not compliant. We recommend the use of oxigen at home and follow PCP plan.      Patient also follows with pneumologist Dr. Vassie Loll and she mentions has an appointment       with in a month period.    Her other medical conditions noted during hospitalization were stable and patient is     decided to discharge home today on June 25, 2011 on a stable     medical condition.   DISCHARGE INSTRUCTIONS:  Activity:  Resume  normal daily activity.  Diet: Carbohydrate modified and heart healthy.  Appointments:  She has an appointment with her PCP Dr. [name] on October 31 at 10:45 a.m. Mental health she needs to make an appointment for followup.    ______________________________ Dayarmys Piloto Rolene Arbour, MD   ______________________________ Leighton Roach Chayanne Speir, M.D.    DP/MEDQ  D:  06/25/2011  T:  06/25/2011  Job:  [id number]  Electronically Signed by Lillia Abed DE LA PAZ  on 07/04/2011 08:44:24 PM Electronically Signed by Acquanetta Belling M.D. on 07/05/2011 05:45:59 AM

## 2011-07-08 ENCOUNTER — Telehealth: Payer: Self-pay | Admitting: Family Medicine

## 2011-07-08 NOTE — Telephone Encounter (Signed)
Spoke with patient. States she is having to squeeze urine out. Started 2-3 weeks ago. No dysuria. She was in for office visit on 10/31 and she forgot to tell MD about this. States she has had this problem in the past off and on. Appointment scheduled tomorrow for evaluation.

## 2011-07-08 NOTE — Telephone Encounter (Signed)
Asking to speak with RN about having bladder/kidney problems, not having pain but is hard to urinate.

## 2011-07-09 ENCOUNTER — Ambulatory Visit: Payer: Medicare Other | Admitting: Family Medicine

## 2011-07-22 ENCOUNTER — Other Ambulatory Visit: Payer: Self-pay | Admitting: Family Medicine

## 2011-07-22 MED ORDER — DILTIAZEM HCL ER COATED BEADS 180 MG PO CP24: 180.0000 mg | ORAL_CAPSULE | Freq: Every day | ORAL | Status: DC

## 2011-07-28 ENCOUNTER — Telehealth: Payer: Self-pay | Admitting: *Deleted

## 2011-07-28 NOTE — Telephone Encounter (Addendum)
Patient calls stating she is mixed up on medications , can't remember when or if she has taken meds. States her memory is getting worse . She is crying over the phone stating she needs some help.  She has a pill box but states all her pills will not fit in it.  Advised her to go to pharmacy and buy a big pill box then come in here this afternoon and RN will help her get all her  pills in the box and try to assist her .     Will discuss with Alpha Gula this afternoon.

## 2011-07-28 NOTE — Telephone Encounter (Signed)
Dr. [name] notified of conversation with patient.

## 2011-07-28 NOTE — Telephone Encounter (Signed)
Patient called and cancelled appointment on nurse schedule this afternoon stating her husband said he would help her with her meds.

## 2011-07-29 ENCOUNTER — Other Ambulatory Visit: Payer: Self-pay | Admitting: Family Medicine

## 2011-07-29 MED ORDER — GLIPIZIDE 5 MG PO TABS: 5.0000 mg | ORAL_TABLET | Freq: Every day | ORAL | Status: DC

## 2011-07-31 ENCOUNTER — Telehealth: Payer: Self-pay | Admitting: Oncology

## 2011-07-31 NOTE — Telephone Encounter (Signed)
called pt to schedule appts for april2013 and she asked that they be mailed to her home

## 2011-08-05 ENCOUNTER — Ambulatory Visit: Payer: Medicare Other

## 2011-08-06 ENCOUNTER — Ambulatory Visit: Payer: Medicare Other | Admitting: Adult Health

## 2011-08-12 ENCOUNTER — Ambulatory Visit: Payer: Medicare Other | Admitting: Adult Health

## 2011-08-13 ENCOUNTER — Ambulatory Visit: Payer: Self-pay | Admitting: Cardiology

## 2011-08-17 ENCOUNTER — Ambulatory Visit: Payer: Medicare Other | Admitting: Adult Health

## 2011-08-24 ENCOUNTER — Ambulatory Visit: Payer: Medicare Other | Admitting: Adult Health

## 2011-08-24 ENCOUNTER — Other Ambulatory Visit: Payer: Self-pay | Admitting: Family Medicine

## 2011-08-26 NOTE — Telephone Encounter (Signed)
Refill request

## 2011-08-28 ENCOUNTER — Telehealth: Payer: Self-pay | Admitting: Family Medicine

## 2011-08-28 MED ORDER — LORAZEPAM 2 MG PO TABS: 2.0000 mg | ORAL_TABLET | Freq: Three times a day (TID) | ORAL | Status: DC

## 2011-08-28 NOTE — Telephone Encounter (Signed)
Rx called in. Fleeger, Jessica Dawn  

## 2011-08-28 NOTE — Telephone Encounter (Signed)
Dear White Team Please call this in THANKS! Sara Neal  

## 2011-08-30 ENCOUNTER — Encounter (HOSPITAL_COMMUNITY): Payer: Self-pay | Admitting: *Deleted

## 2011-08-30 ENCOUNTER — Emergency Department (HOSPITAL_COMMUNITY)
Admission: EM | Admit: 2011-08-30 | Discharge: 2011-08-30 | Disposition: A | Discharge status: Home / Self Care | Payer: Medicare Other | Attending: Cardiology | Admitting: Cardiology

## 2011-08-30 ENCOUNTER — Emergency Department (HOSPITAL_COMMUNITY): Payer: Medicare Other

## 2011-08-30 ENCOUNTER — Other Ambulatory Visit: Payer: Self-pay

## 2011-08-30 DIAGNOSIS — J449 Chronic obstructive pulmonary disease, unspecified: Secondary | ICD-10-CM

## 2011-08-30 DIAGNOSIS — I10 Essential (primary) hypertension: Secondary | ICD-10-CM | POA: Insufficient documentation

## 2011-08-30 DIAGNOSIS — Z853 Personal history of malignant neoplasm of breast: Secondary | ICD-10-CM | POA: Insufficient documentation

## 2011-08-30 DIAGNOSIS — R110 Nausea: Secondary | ICD-10-CM | POA: Insufficient documentation

## 2011-08-30 DIAGNOSIS — J4489 Other specified chronic obstructive pulmonary disease: Secondary | ICD-10-CM | POA: Insufficient documentation

## 2011-08-30 DIAGNOSIS — Z79899 Other long term (current) drug therapy: Secondary | ICD-10-CM | POA: Insufficient documentation

## 2011-08-30 DIAGNOSIS — R079 Chest pain, unspecified: Secondary | ICD-10-CM | POA: Insufficient documentation

## 2011-08-30 DIAGNOSIS — Z7901 Long term (current) use of anticoagulants: Secondary | ICD-10-CM | POA: Insufficient documentation

## 2011-08-30 DIAGNOSIS — Z955 Presence of coronary angioplasty implant and graft: Secondary | ICD-10-CM

## 2011-08-30 DIAGNOSIS — E119 Type 2 diabetes mellitus without complications: Secondary | ICD-10-CM | POA: Insufficient documentation

## 2011-08-30 LAB — CARDIAC PANEL(CRET KIN+CKTOT+MB+TROPI)
Relative Index: INVALID (ref 0.0–2.5)
Total CK: 48 U/L (ref 7–177)
Troponin I: <0.3 ng/mL (ref <0.30)

## 2011-08-30 LAB — GLUCOSE, CAPILLARY: Glucose-Capillary: 62 mg/dL — ABNORMAL LOW (ref 70–99)

## 2011-08-30 LAB — CBC
HCT: 36 % (ref 36.0–46.0)
MCHC: 32.2 g/dL (ref 30.0–36.0)
MCV: 91.8 fL (ref 78.0–100.0)
RDW: 13.4 % (ref 11.5–15.5)

## 2011-08-30 LAB — DIFFERENTIAL
Basophils Absolute: 0 10*3/uL (ref 0.0–0.1)
Basophils Relative: 0 % (ref 0–1)
Eosinophils Relative: 2 % (ref 0–5)
Monocytes Absolute: 0.9 10*3/uL (ref 0.1–1.0)

## 2011-08-30 LAB — BASIC METABOLIC PANEL
Calcium: 9.1 mg/dL (ref 8.4–10.5)
Creatinine, Ser: 0.49 mg/dL — ABNORMAL LOW (ref 0.50–1.10)
GFR calc Af Amer: >90 mL/min (ref >90)
GFR calc non Af Amer: >90 mL/min (ref >90)

## 2011-08-30 MED ORDER — MORPHINE SULFATE 4 MG/ML IJ SOLN
4.0000 mg | Freq: Once | INTRAMUSCULAR | Status: AC
  Administered 2011-08-30: 4 mg via INTRAVENOUS
  Filled 2011-08-30: qty 1

## 2011-08-30 MED ORDER — DEXTROSE 50 % IV SOLN
INTRAVENOUS | Status: AC
  Filled 2011-08-30: qty 50

## 2011-08-30 MED ORDER — ONDANSETRON HCL 4 MG/2ML IJ SOLN
4.0000 mg | Freq: Once | INTRAMUSCULAR | Status: AC
  Administered 2011-08-30: 4 mg via INTRAVENOUS
  Filled 2011-08-30: qty 2

## 2011-08-30 MED ORDER — DEXTROSE 50 % IV SOLN
50.0000 mL | Freq: Once | INTRAVENOUS | Status: AC
  Administered 2011-08-30: 50 mL via INTRAVENOUS
  Filled 2011-08-30: qty 50

## 2011-08-30 MED ORDER — NITROGLYCERIN 0.4 MG SL SUBL
0.4000 mg | SUBLINGUAL_TABLET | SUBLINGUAL | Status: DC | PRN
  Administered 2011-08-30: 0.4 mg via SUBLINGUAL

## 2011-08-30 MED ORDER — ASPIRIN 81 MG PO CHEW
162.0000 mg | CHEWABLE_TABLET | Freq: Once | ORAL | Status: AC
  Administered 2011-08-30: 162 mg via ORAL
  Filled 2011-08-30: qty 2

## 2011-08-30 MED ORDER — SODIUM CHLORIDE 0.9 % IV BOLUS (SEPSIS)
500.0000 mL | Freq: Once | INTRAVENOUS | Status: AC
  Administered 2011-08-30: 500 mL via INTRAVENOUS

## 2011-08-30 NOTE — ED Provider Notes (Signed)
Patient was evaluated at bedside by Dr. Herbie Baltimore. Please see his note for details. He requested an additional set of cardiac enzymes. Once this returned negative he felt that patient was safe for discharge. She will followup with Dr. Allyson Sabal. Patient was comfortable with plan and was discharged in good condition.  Cyndra Numbers, MD 08/30/11 [phone]

## 2011-08-30 NOTE — ED Notes (Signed)
Patient back from x-ray 

## 2011-08-30 NOTE — Consult Note (Signed)
[facility]  CARDIOLOGY CONSULTATION NOTE.  NAME:  Maria Gaines   MRN: [id number] DOB:  1950/11/25   ADMIT DATE: 08/30/2011  Reason for Consult: Chest Pain evaluation  Requesting Physician: Drucie Opitz, Shela Commons, PA  Primary Cardiologist: Michaela Corner, MD  HPI: This is a 60 y.o. female with a past medical history significant for known single vessel CAD - s/p PCI to RCA as well as HTN, HLD, DM-2, long history of smoking with COPD who presented to the [facility] ED this AM complaining of Left sides chest discomfort.  Had some discomfort last PM, but this AM when getting ready for church - had prolonged episosde of discomfort with mild nausea.  She describes the discomfort as a sharp pressure under & beside her left breast.  No relief from ASA or NTG SL.  Since arrival to the ED, her prolonged discomfort has subsided, but has had "fleeting moments" of the sharp pain with certain movements & adjusting her gown.  Not lasing long enough to even have time to mention it.   Denies PND, orthopnea, edema; no worsening of SOB on exertion and no SOB at rest. ; pertinent negatives - claudication, dyspnea, exertional chest pressure/discomfort, irregular heart beat, lower extremity edema, near-syncope, orthopnea, palpitations, paroxysmal nocturnal dyspnea, syncope and tachypnea  advanced age (older than 62 for men, 31 for women), diabetes mellitus, dyslipidemia, hypertension, obesity (BMI >= 30 kg/m2) and smoking/ tobacco exposure diabetes, hypertension, hyperlipidemia, coronary artery disease, obesity and status post coronary artery stenting   PMHx:  CARDIAC HISTORY: Cath:  11/30/2003 -- single vessel disease; with PCI of 90% proximal RCA lesion; Cypher DES 3.5 mm x 23 mm (post-dilated to 4.51mm) NST:  12/2009 -- No ischemia or infarct. Echo:  Unsure (done @ [facility])  Past Medical History  Diagnosis Date  . Visual field defect 03/09/11    Visual field defect right eye. Dr Cammy Copa  optomotrist. plans rechck 6 m  . Hypertension    Dyslipidemia - on statin   . Diabetes mellitus - on PO meds   . COPD (chronic obstructive pulmonary disease) - on home supplemental O2   . Breast cancer - s/p Chemo, XRT & Lumpectomy (infiltrating ductal CA) 2005   FAMHx: History reviewed. No pertinent family history.  SOCHx:  reports that she quit smoking about 11 years ago. Her smoking use included Cigarettes. She has a 75 pack-year smoking history. She has never used smokeless tobacco. She reports that she does not drink alcohol. Her drug history not on file.  ALLERGIES: Reportedly ASA, Codeine & Zolpidem  ROS: chest pain - described as sharp intermittent symptoms, initial episode lasted several minutes (not sure exactly what she was doing to bring it on), over last couple of hrs, - episodes have been fleeting, lasting seconds. A comprehensive review of 10 systems was negative except for: Respiratory: positive for cough  HOME MEDICATIONS:   ASPIRIN 81 MG PO TABS  Oral Take 81 mg by mouth daily  .  ATORVASTATIN CALCIUM 80 MG PO TABS  Oral  Take 1 tablet (80 mg total) by mouth at bedtime.   .  ATROVENT HFA 17 MCG/ACT IN AERS     .  ATROVENT HFA 17 MCG/ACT IN AERS   INHALE 4 PUFFS EVERY 2 HOURS AS NEEDED FOR SHORTNESS OF BREATH   .  BENAZEPRIL HCL 5 MG PO TABS  Oral  Take 1 tablet (5 mg total) by mouth daily.   Marland Kitchen  CARVEDILOL 6.25 MG  PO TABS  Oral  Take 6.25 mg by mouth 2 (two) times daily with a meal.   .  CLOPIDOGREL BISULFATE 75 MG PO TABS  Oral  Take 75 mg by mouth daily.   Marland Kitchen  DILTIAZEM HCL ER COATED BEADS 180 MG PO CP24  Oral  Take 1 capsule (180 mg total) by mouth daily.   Marland Kitchen  FEXOFENADINE-PSEUDOEPHED ER 60-120 MG PO TB12  Oral  Take 1 tablet by mouth 2 (two) times daily as needed.   Marland Kitchen  FLUTICASONE-SALMETEROL 250-50 MCG/DOSE IN AEPB  Inhalation  Inhale 1 puff into the lungs 2 (two) times daily.   .  FUROSEMIDE 20 MG PO TABS  Oral  Take 1 tablet (20 mg total) by mouth daily.   Marland Kitchen   GABAPENTIN 300 MG PO CAPS  Oral  Take 1 capsule (300 mg total) by mouth 2 (two) times daily.   Marland Kitchen  GLIPIZIDE 5 MG PO TABS  Oral  Take 1 tablet (5 mg total) by mouth daily.   .  IPRATROPIUM-ALBUTEROL 0.5-2.5 (3) MG/3ML IN SOLN   1 treatment every 12 hours SCHEDULED and every 2 hours as needed for coughing or wheezing / shortness of breath   .  LORAZEPAM 2 MG PO TABS  Oral  Take 1 tablet (2 mg total) by mouth 3 (three) times daily.   Marland Kitchen  METFORMIN HCL 1000 MG PO TABS  Oral  Take 1 tablet (1,000 mg total) by mouth 2 (two) times daily with a meal.   .  OMEPRAZOLE 40 MG PO CPDR  Oral  Take 1 capsule (40 mg total) by mouth daily.   Marland Kitchen  POLYETHYLENE GLYCOL 3350 PO POWD  Oral  Take 17 g by mouth as directed. One capful dissolved in 8 ounces of fluid by mouth once daily as needed for constipation. Disp: one month supply. Refill: prn   .  POTASSIUM CHLORIDE 10 MEQ PO TBCR  Oral  Take 10 mEq by mouth 2 (two) times daily.   Marland Kitchen  PROAIR HFA 108 (90 BASE) MCG/ACT IN AERS  Inhalation  Inhale 2 puffs into the lungs daily.   .  THEOPHYLLINE 300 MG PO TB12  Oral  Take 1 tablet (300 mg total) by mouth 2 (two) times daily.   Marland Kitchen  TIOTROPIUM BROMIDE MONOHYDRATE 18 MCG IN CAPS  Inhalation  Place 1 capsule (18 mcg total) into inhaler and inhale daily.    PHYSICAL EXAM: VITALS: Blood pressure 121/72, pulse 82, temperature 98.3 F (36.8 C), temperature source Oral, resp. rate 18, SpO2 97.00%. General appearance: alert, cooperative and no distress Neck: no adenopathy, no carotid bruit, no JVD, supple, symmetrical, trachea midline and thyroid not enlarged, symmetric, no tenderness/mass/nodules Lungs: clear to auscultation bilaterally and occasional end expiratory wheeze Heart: regular rate and rhythm, S1, S2 normal, no murmur, click, rub or gallop Abdomen: soft, non-tender; bowel sounds normal; no masses,  no organomegaly Extremities: extremities normal, atraumatic, no cyanosis or edema Pulses: 2+ and symmetric Skin: Skin  color, texture, turgor normal. No rashes or lesions Neurologic: Alert and oriented X 3, normal strength and tone. Normal symmetric reflexes. Normal coordination and gait Palpation of the left chest wall along the costal margin & under the breast reproduced her sharp discomfort similar to what she noted this AM & last PM   LABS: Results for orders placed during the hospital encounter of 08/30/11 (from the past 48 hour(s))  GLUCOSE, CAPILLARY     Status: Abnormal   Collection Time   08/30/11  12:25 PM      Component Value Range Comment   Glucose-Capillary 62 (*) 70 - 99 (mg/dL)    Comment 1 Documented in Chart      Comment 2 Notify RN     CBC     Status: Abnormal   Collection Time   08/30/11 12:29 PM      Component Value Range Comment   WBC 9.6  4.0 - 10.5 (K/uL)    RBC 3.92  3.87 - 5.11 (MIL/uL)    Hemoglobin 11.6 (*) 12.0 - 15.0 (g/dL)    HCT 16.1  09.6 - 04.5 (%)    MCV 91.8  78.0 - 100.0 (fL)    MCH 29.6  26.0 - 34.0 (pg)    MCHC 32.2  30.0 - 36.0 (g/dL)    RDW 40.9  81.1 - 91.4 (%)    Platelets 253  150 - 400 (K/uL)   BASIC METABOLIC PANEL     Status: Abnormal   Collection Time   08/30/11 12:29 PM      Component Value Range Comment   Sodium 138  135 - 145 (mEq/L)    Potassium 3.4 (*) 3.5 - 5.1 (mEq/L)    Chloride 98  96 - 112 (mEq/L)    CO2 31  19 - 32 (mEq/L)    Glucose, Bld 52 (*) 70 - 99 (mg/dL)    BUN 9  6 - 23 (mg/dL)    Creatinine, Ser 7.82 (*) 0.50 - 1.10 (mg/dL)    Calcium 9.1  8.4 - 10.5 (mg/dL)    GFR calc non Af Amer >90  >90 (mL/min)    GFR calc Af Amer >90  >90 (mL/min)   POCT I-STAT TROPONIN I     Status: Normal   Collection Time   08/30/11 12:49 PM      Component Value Range Comment   Troponin i, poc 0.00  0.00 - 0.08 (ng/mL)    Comment 3            GLUCOSE, CAPILLARY     Status: Normal   Collection Time   08/30/11  1:48 PM      Component Value Range Comment   Glucose-Capillary 91  70 - 99 (mg/dL)    Comment 1 Documented in Chart      Comment 2  Notify RN       IMAGING: Personally reviewed. Dg Chest 2 View  08/30/2011  *RADIOLOGY REPORT*  Clinical Data: Chest pain  CHEST - 2 VIEW  Comparison: 06/24/2011  Findings: Cardiomediastinal silhouette is unremarkable.  No acute infiltrate or pleural effusion.  No pulmonary edema.  Mild basilar atelectasis.  No pneumothorax.  Surgical clips are noted in the left axilla.  IMPRESSION: No acute infiltrate or pulmonary edema.  Basilar atelectasis.  Original Report Authenticated By: Natasha Mead, M.D.   ECG - no acute changes except for V3 TWI.  Likely non-specific.  IMPRESSION: Coronary Artery Disease, History of DES to RCA in 2005 with last Myoview 12/2009 @ [facility] - no ischemia Left Ventricular Disfunction, EF ~40% by myoview years ago - no recent Echo (unable to access [facility] records) Pulmonary Disease HTN/HLS  I feel that Ms. Gordon's pain is most likely musculoskeletal in nature.  She notes that is more noticeable with certain movements or with "messing with her left breast area".  The symptoms was reproducible on examination.  In fact, her first comment to me was - I am feeling fine and would like to go home.  RECOMMENDATION: 1. Check  another set of cardiac biomarkers now - as this would be ~6-8 hrs since onset of her most prolonged episode of pain.  If Troponin is negative - I feel that she should be fine for discharge with planned outpt f/u with Dr. Allyson Sabal @ [facility] -- she may well be evaluated with a myoview as it has been over 1 1/2 years since her last evaluation. 2. As she is on ASA & Plavix, would not recommend NSAIDs to treat MSK chest pain -- use ES Tylenol. 3. Continue other home medications for CAD, HTN, HLD & DM.   Time Spent Directly with Patient: 15 minutes, 15 min with chart.  Marykay Lex, M.D., M.S. [facility] [address] [place], [place]  [address]  [phone]  08/30/2011 4:19 PM

## 2011-08-30 NOTE — ED Notes (Signed)
Sitting on side of bed with husband at bedside. Patient remains on monitor and 2L oxygen saturation of 95% with NAD at this time.

## 2011-08-30 NOTE — ED Provider Notes (Addendum)
History     CSN: [id number]  Arrival date & time 08/30/11  1156   First MD Initiated Contact with Patient 08/30/11 1159      Chief Complaint  Patient presents with  . Chest Pain    (Consider location/radiation/quality/duration/timing/severity/associated sxs/prior treatment) HPI  Patient with history of oxygen-dependent COPD who wears 2 L of oxygen at home at baseline as well as history of coronary artery disease with history of stent placement who is followed by Dr. Allyson Sabal presents to emergency department complaining of gradual onset left-sided chest pain stating she noted noticed a dull discomfort last night however went on to bed but states that she woke this morning with increasing left-sided chest pain stating that she stayed home from church due to discomfort with associated nausea. Patient denies any associated shortness of breath, cough, wheezing, fevers, chills, vomiting, radiation of pain, recent illness. Patient took a baby aspirin and nitroglycerin without any relief of pain prior to arrival. EMS reports that they checked a fingerstick glucose and found her sugar to be 41 and therefore gave her amp of D50 with sugar raising 295 at recheck. Patient denies any aggravating or alleviating factors.  Past Medical History  Diagnosis Date  . Visual field defect 03/09/11    Visual field defect right eye. Dr Cammy Copa optomotrist. plans rechck 6 m  . Hypertension   . Diabetes mellitus   . COPD (chronic obstructive pulmonary disease)   . Breast cancer 2005    History reviewed. No pertinent past surgical history.  History reviewed. No pertinent family history.  History  Substance Use Topics  . Smoking status: Former Smoker -- 1.5 packs/day for 50 years    Types: Cigarettes    Quit date: 08/31/2000  . Smokeless tobacco: Never Used  . Alcohol Use: No    OB History    Grav Para Term Preterm Abortions TAB SAB Ect Mult Living                  Review of Systems  All other systems  reviewed and are negative.    Allergies  Aspirin; Codeine; and Zolpidem tartrate  Home Medications   Current Outpatient Rx  Name Route Sig Dispense Refill  . ASPIRIN 81 MG PO TABS Oral Take 81 mg by mouth daily.      . ATORVASTATIN CALCIUM 80 MG PO TABS Oral Take 1 tablet (80 mg total) by mouth at bedtime. 30 tablet 12  . ATROVENT HFA 17 MCG/ACT IN AERS      . ATROVENT HFA 17 MCG/ACT IN AERS  INHALE 4 PUFFS EVERY 2 HOURS AS NEEDED FOR SHORTNESS OF BREATH 1 Inhaler 10  . BENAZEPRIL HCL 5 MG PO TABS Oral Take 1 tablet (5 mg total) by mouth daily. 30 tablet 11  . CARVEDILOL 6.25 MG PO TABS Oral Take 6.25 mg by mouth 2 (two) times daily with a meal.     . CLOPIDOGREL BISULFATE 75 MG PO TABS Oral Take 75 mg by mouth daily.      Marland Kitchen DILTIAZEM HCL ER COATED BEADS 180 MG PO CP24 Oral Take 1 capsule (180 mg total) by mouth daily. 90 capsule 3  . FEXOFENADINE-PSEUDOEPHED ER 60-120 MG PO TB12 Oral Take 1 tablet by mouth 2 (two) times daily as needed.      Marland Kitchen FLUTICASONE-SALMETEROL 250-50 MCG/DOSE IN AEPB Inhalation Inhale 1 puff into the lungs 2 (two) times daily.      . FUROSEMIDE 20 MG PO TABS Oral Take 1 tablet (20  mg total) by mouth daily. 180 tablet 3  . GABAPENTIN 300 MG PO CAPS Oral Take 1 capsule (300 mg total) by mouth 2 (two) times daily. 60 capsule 12  . GLIPIZIDE 5 MG PO TABS Oral Take 1 tablet (5 mg total) by mouth daily. 30 tablet 11  . IPRATROPIUM-ALBUTEROL 0.5-2.5 (3) MG/3ML IN SOLN  1 treatment every 12 hours SCHEDULED and every 2 hours as needed for coughing or wheezing / shortness of breath 360 mL 11  . LORAZEPAM 2 MG PO TABS Oral Take 1 tablet (2 mg total) by mouth 3 (three) times daily. 90 tablet 5  . METFORMIN HCL 1000 MG PO TABS Oral Take 1 tablet (1,000 mg total) by mouth 2 (two) times daily with a meal. 60 tablet 5  . OMEPRAZOLE 40 MG PO CPDR Oral Take 1 capsule (40 mg total) by mouth daily. 90 capsule 3  . POLYETHYLENE GLYCOL 3350 PO POWD Oral Take 17 g by mouth as directed.  One capful dissolved in 8 ounces of fluid by mouth once daily as needed for constipation. Disp: one month supply. Refill: prn     . POTASSIUM CHLORIDE 10 MEQ PO TBCR Oral Take 10 mEq by mouth 2 (two) times daily.      Marland Kitchen PROAIR HFA 108 (90 BASE) MCG/ACT IN AERS Inhalation Inhale 2 puffs into the lungs daily.    . THEOPHYLLINE 300 MG PO TB12 Oral Take 1 tablet (300 mg total) by mouth 2 (two) times daily. 180 tablet 3  . TIOTROPIUM BROMIDE MONOHYDRATE 18 MCG IN CAPS Inhalation Place 1 capsule (18 mcg total) into inhaler and inhale daily. 30 capsule 2    There were no vitals taken for this visit.  Physical Exam  Nursing note and vitals reviewed. Constitutional: She is oriented to person, place, and time. She appears well-developed and well-nourished. No distress.  HENT:  Head: Normocephalic and atraumatic.  Eyes: Conjunctivae are normal.  Neck: Normal range of motion. Neck supple.  Cardiovascular: Normal rate, regular rhythm, normal heart sounds and intact distal pulses.  Exam reveals no gallop and no friction rub.   No murmur heard. Pulmonary/Chest: Effort normal. No respiratory distress. She has no wheezes. She has rales. She exhibits no tenderness.  Abdominal: Soft. Bowel sounds are normal. She exhibits no distension and no mass. There is no tenderness. There is no rebound and no guarding.  Musculoskeletal: Normal range of motion. She exhibits no edema and no tenderness.  Neurological: She is alert and oriented to person, place, and time.  Skin: Skin is warm and dry. No rash noted. She is not diaphoretic. No erythema.  Psychiatric: She has a normal mood and affect.    ED Course  Procedures (including critical care time)  IV fluids, IV morphine and zofran. PO ASA and SL nitro   Date: 08/30/2011  Rate: 77  Rhythm: normal sinus rhythm  QRS Axis: normal  Intervals: normal  ST/T Wave abnormalities: nonspecific T wave changes  Conduction Disutrbances:none  Narrative Interpretation:    Old EKG Reviewed: deep T wave inversion in V3 compared to Oct 23, 12   Labs Reviewed  CBC - Abnormal; Notable for the following:    Hemoglobin 11.6 (*)    All other components within normal limits  BASIC METABOLIC PANEL - Abnormal; Notable for the following:    Potassium 3.4 (*)    Glucose, Bld 52 (*)    Creatinine, Ser 0.49 (*)    All other components within normal limits  GLUCOSE, CAPILLARY -  Abnormal; Notable for the following:    Glucose-Capillary 62 (*)    All other components within normal limits  DIFFERENTIAL  POCT I-STAT TROPONIN I  GLUCOSE, CAPILLARY  I-STAT TROPONIN I  POCT CBG MONITORING  POCT CBG MONITORING   Dg Chest 2 View  08/30/2011  *RADIOLOGY REPORT*  Clinical Data: Chest pain  CHEST - 2 VIEW  Comparison: 06/24/2011  Findings: Cardiomediastinal silhouette is unremarkable.  No acute infiltrate or pleural effusion.  No pulmonary edema.  Mild basilar atelectasis.  No pneumothorax.  Surgical clips are noted in the left axilla.  IMPRESSION: No acute infiltrate or pulmonary edema.  Basilar atelectasis.  Original Report Authenticated By: Natasha Mead, M.D.     1. Chest pain   2. COPD (chronic obstructive pulmonary disease)   3. Diabetes mellitus     2:34 PM [facility] to evaluate patient for CP.   MDM  VSS. Negative initial troponin. [facility] to see patient for evaluation.         [name], PA 08/30/11 1436  Lenon Oms [place], [place] 11/11/11 1746

## 2011-08-30 NOTE — ED Provider Notes (Signed)
Medical screening examination/treatment/procedure(s) were conducted as a shared visit with non-physician practitioner(s) and myself.  I personally evaluated the patient during the encounter  Cyndra Numbers, MD 08/30/11 2214

## 2011-08-30 NOTE — ED Notes (Signed)
Nitroglycerin sublingual given for sharpe chest pain located under patients left breast. BP 128/66  P 77.

## 2011-08-30 NOTE — ED Notes (Addendum)
Patient CBG 62, patient not diaphoretic, talking and laughing with staff. Patient does seem to be more confused about the episodes that caused her to come to the ED. On arrival patient stated she ate breakfast and took her insulin only, no other medication. During interview with pharmacy patient stated she took all he medication this morning but she did not eat any breakfast.  Patient remains on monitor and is resting with NAD at this time. Patient states she has no chest pain under her left breast since receiving pain medication in the ED.

## 2011-08-30 NOTE — ED Notes (Addendum)
Per EMS - CBG 62, 10 minutes later CBG 49 and patient diaphroetic, EMS gave 12.5 grams of D50. CBG recheck was 185 on arrival.  Patient called EMS this morning because she continues to have Chest pain since last night.  No N/V or fever. COPD patient uses 2L oxygen at home was 99%  When EMS arrived. 12 lead good with couple PVC at beginning. Patient took home nitro x 1 and baby aspirin before EMS arrived. Patient denies cough or sickness.

## 2011-08-30 NOTE — ED Notes (Addendum)
Patient continues to be confused and now does not remember what she was wearing when she came to the hospital. Patient talking with staff with clear speech and not diaphoresis noted, moving all extremities and neuro NAD.

## 2011-08-30 NOTE — ED Notes (Signed)
Patient resting with NAD at this time and states she is having no pain in her chest under her left breast or any where in her chest and denies SOB, N/V. Patient is trying to call her husband to let him know she is in the ED.

## 2011-08-30 NOTE — ED Notes (Addendum)
Patient states she started to have sharpe quick chest pains under her left breast yesterday evening and she took two extra strength tylenol and the pain went away. Patient states she had gotten up this morning ans was getting ready to go to church and her chest (under her left breast, started to hurt again, patient states she took one nitroglycerin tablet with no relief. Patient denies N/V or fever.

## 2011-08-30 NOTE — ED Notes (Signed)
Gave report to [place], Charity fundraiser. Patient moving to [id number].

## 2011-08-31 ENCOUNTER — Ambulatory Visit: Payer: Medicare Other | Admitting: Adult Health

## 2011-09-03 ENCOUNTER — Encounter: Payer: Self-pay | Admitting: Adult Health

## 2011-09-03 ENCOUNTER — Ambulatory Visit (INDEPENDENT_AMBULATORY_CARE_PROVIDER_SITE_OTHER): Payer: Medicare Other | Admitting: Adult Health

## 2011-09-03 DIAGNOSIS — J449 Chronic obstructive pulmonary disease, unspecified: Secondary | ICD-10-CM

## 2011-09-03 DIAGNOSIS — J4489 Other specified chronic obstructive pulmonary disease: Secondary | ICD-10-CM

## 2011-09-03 NOTE — Assessment & Plan Note (Signed)
Compensated on present regimen.  follow up Dr. Vassie Loll  In 3 months and As needed

## 2011-09-03 NOTE — Patient Instructions (Signed)
Continue on current regimen.  follow up Dr. Vassie Loll  In 3 months and As needed

## 2011-09-03 NOTE — Progress Notes (Signed)
Subjective:    Patient ID: Maria Gaines, female    DOB: 08-Oct-1950, 61 y.o.   MRN: [id number]  HPI  PCP - [name] neal  60/F, retired Lawyer at [employer], for [employer] of COPD.  INitial OV 6/10 PFTs 6/18 >> severe airway obstruction FEV1 30% without BD response, hyperinflation  She had respiratory failure requiring mechanical ventilation in 4/05, has required oxygen piror to that (? 2004) & done well on a regimen of advair, theophylline, albuterol MDI & nebs & atrovent MDI.   She desaturates on walking to the exam room. L Breast CA--StageII mulitifoc infiltrating ductal 4/06-- Lumpectomy (1 out of 12 nodes positive (2mm focus), opth-- bernstorf ([address]), Arimidex (1mg  q d) therapy for 5+ years (heme/onc), s/p chemo with cytoxan and adriamycin 2003,  s/p radiation for breast CA ([place]/young) 2003  06/02/2012 Sudden onset dyspnea few days ago that woke her up from sleep ? Panic attacks since '05 when she required mech ventilation CXR 9/12 hyperinflation She reports white phlegm, no fevers, occasional weezing.  She is compliant with O2  >no changes   09/03/2011 Follow up  Pt returns for COPD follow up. Says she has been doing well overall. Did have episode of chest pain 12/30 and was seen in ER. CXR showed no acute process. She was ruled out for MI.  Says has had mild drainage and dry cough last couple of days but no discolored mucus or fever.  No increased SABA use.  OF note she is on ACE inhibitor and Theophylline. Denies chronic cough or overt reflux .       Review of Systems Constitutional:   No  weight loss, night sweats,  Fevers, chills,  +fatigue, or  lassitude.  HEENT:   No headaches,  Difficulty swallowing,  Tooth/dental problems, or  Sore throat,                No sneezing, itching, ear ache, nasal congestion, post nasal drip,   CV:  No chest pain,  Orthopnea, PND, swelling in lower extremities, anasarca, dizziness, palpitations, syncope.   GI  No heartburn, indigestion,  abdominal pain, nausea, vomiting, diarrhea, change in bowel habits, loss of appetite, bloody stools.   Resp:  No coughing up of blood.     No chest wall deformity  Skin: no rash or lesions.  GU: no dysuria, change in color of urine, no urgency or frequency.  No flank pain, no hematuria   MS:  No joint pain or swelling.  No decreased range of motion.  No back pain.  Psych:  No change in mood or affect. No depression or anxiety.  No memory loss.          Objective:   Physical Exam GEN: A/Ox3; pleasant , NAD, elderly   HEENT:  [place]/AT,  EACs-clear, TMs-wnl, NOSE-clear, THROAT-clear, no lesions, no postnasal drip or exudate noted.   NECK:  Supple w/ fair ROM; no JVD; normal carotid impulses w/o bruits; no thyromegaly or nodules palpated; no lymphadenopathy.  RESP  Coarse BS, no accessory muscle use, no dullness to percussion  CARD:  RRR, no m/r/g  , no peripheral edema, pulses intact, no cyanosis or clubbing.  GI:   Soft & nt; nml bowel sounds; no organomegaly or masses detected.  Musco: Warm bil, no deformities or joint swelling noted.   Neuro: alert, no focal deficits noted.    Skin: Warm, no lesions or rashes           Assessment & Plan:

## 2011-09-08 ENCOUNTER — Telehealth: Payer: Self-pay | Admitting: Family Medicine

## 2011-09-08 NOTE — Telephone Encounter (Signed)
Maria Gaines need you to complete a handicap placard and mail to her to be taken to [facility] for renewal.  Please call completed.

## 2011-09-08 NOTE — Telephone Encounter (Signed)
Done and mailed

## 2011-09-10 ENCOUNTER — Ambulatory Visit: Payer: Self-pay | Admitting: Cardiology

## 2011-09-14 ENCOUNTER — Other Ambulatory Visit: Payer: Self-pay | Admitting: Family Medicine

## 2011-09-14 ENCOUNTER — Encounter: Payer: Self-pay | Admitting: Family Medicine

## 2011-09-14 ENCOUNTER — Ambulatory Visit (INDEPENDENT_AMBULATORY_CARE_PROVIDER_SITE_OTHER): Payer: Medicare Other | Admitting: Family Medicine

## 2011-09-14 VITALS — BP 108/66 | HR 98 | Temp 99.0°F | Ht 63.0 in | Wt 161.6 lb

## 2011-09-14 DIAGNOSIS — R300 Dysuria: Secondary | ICD-10-CM | POA: Insufficient documentation

## 2011-09-14 DIAGNOSIS — M545 Low back pain, unspecified: Secondary | ICD-10-CM

## 2011-09-14 LAB — POCT URINALYSIS DIPSTICK
Blood, UA: NEGATIVE
Nitrite, UA: NEGATIVE
Urobilinogen, UA: 1
pH, UA: 6

## 2011-09-14 NOTE — Telephone Encounter (Signed)
Refill request

## 2011-09-14 NOTE — Progress Notes (Signed)
Subjective:     Patient ID: Maria Gaines, female   DOB: December 03, 1950, 61 y.o.   MRN: [id number]  HPI 61 yo F presents with complaint of 1 week of low back pain that has resolved. Pain started in R low back/flank and moved to left. Was most severe on the R over the weekend. Associated with increased urinary frequency, dysuria and decreased appetite. Patient denied nausea, vomiting, fever and chills. She states that this AM her pain resolved. She called to cancel her appointment but was told to keep it and come in.   Patient reports being compliant with medications and usually checking CBGS q AM. Does not remember her CBG from this AM.   Review of Systems Pertinent ROS as per HPI GI: chronic epigastric pain SKIN: breast lump and medial R ankle lesion ("has been there forever")    Objective:   Physical Exam Filed Vitals:   09/14/11 1445  BP: 108/66  Pulse: 98  Temp: 99 F (37.2 C)  GEN: alert female, well dressed, has O2 tank, no distress.  ABD: Soft, obese, tenderness in epigastric area with voluntary guarding, no rebound otherwise non tender. Back: No rash. No CVA tenderness, no paraspinal tenderness, no lumbar tenderness. Gait: normal.   Labs:  UA: trace bili and ketones     Assessment:         Plan:

## 2011-09-14 NOTE — Patient Instructions (Signed)
Mrs. Zaccaro,  Thank you for coming in today. There is no evidence of infection or kidney stone in your urine. I am happy you are feeling better. Continue to drink water and get better.  F/u with Dr. [name] as needed.  -Dr. Armen Pickup

## 2011-09-14 NOTE — Assessment & Plan Note (Addendum)
A: resolved. UA negative for evidence of UTI or kidney stones (hematuria).  UA with small ketones and bili. Patient informed. P:  -check CBGs at home. -Take medications, especially diabetes meds as prescribed.  -patient to f/u with PCP to discuss other concerns (breast, chronic skin lesion).

## 2011-09-17 ENCOUNTER — Ambulatory Visit (INDEPENDENT_AMBULATORY_CARE_PROVIDER_SITE_OTHER): Payer: Self-pay | Admitting: Cardiology

## 2011-09-17 ENCOUNTER — Encounter: Payer: Self-pay | Admitting: Cardiology

## 2011-09-17 VITALS — BP 114/69 | HR 96 | Ht 62.0 in | Wt 164.0 lb

## 2011-09-17 DIAGNOSIS — I2510 Atherosclerotic heart disease of native coronary artery without angina pectoris: Secondary | ICD-10-CM

## 2011-09-17 DIAGNOSIS — R0602 Shortness of breath: Secondary | ICD-10-CM

## 2011-09-17 DIAGNOSIS — E7800 Pure hypercholesterolemia, unspecified: Secondary | ICD-10-CM

## 2011-09-17 DIAGNOSIS — J449 Chronic obstructive pulmonary disease, unspecified: Secondary | ICD-10-CM

## 2011-09-17 NOTE — Patient Instructions (Signed)
Your physician has requested that you have an echocardiogram. Echocardiography is a painless test that uses sound waves to create images of your heart. It provides your doctor with information about the size and shape of your heart and how well your heart's chambers and valves are working. This procedure takes approximately one hour. There are no restrictions for this procedure.  Your physician recommends that you return for a FASTING lipid profile /BMET/BNP 786.05  414.01   Your physician wants you to follow-up in: 6 months with Dr Shirlee Latch.(July 2013). You will receive a reminder letter in the mail two months in advance. If you don't receive a letter, please call our office to schedule the follow-up appointment.

## 2011-09-20 NOTE — Assessment & Plan Note (Signed)
Check lipids with goal LDL < 70.   

## 2011-09-20 NOTE — Assessment & Plan Note (Addendum)
I suspect that most of her exertional dyspnea comes from COPD.  She is not volume overloaded on exam.  At next appointment, will plan on replacing Coreg with bisoprolol for better beta-1 selectivity.  I will get a BNP also.

## 2011-09-20 NOTE — Assessment & Plan Note (Signed)
Atypical chest pain in 12/12.  No chest pain since.  I suspect that this was non-cardiac.  I do not think that she needs a stress test at this time given no recurrent symptoms.  However, I will get an echocardiogram.  Continue ASA 81, statin, ACEI, Coreg.

## 2011-09-20 NOTE — Progress Notes (Signed)
PCP: Dr. [name]  61 yo with history of COPD on home oxygen and CAD s/p PCI in 2005 presents to establish outpatient cardiology followup.  She has previously been seen by a different practice.  She was admitted in 10/12 with a COPD exacerbation.  She was seen in the ER on 12/31 with atypical chest pain (sharp, lasted a few seconds but quite severe). Cardiac enzymes and ECG were negative and she was sent home.  She has had no chest pain since then.  She has chronic severe exertional dyspnea attributed to COPD.  She is short of breath just walking in her house and needs oxygen at all times.  She has a very hard time going up steps.  Her activity is also limited by fibromyalgia and peripheral neuropathy.   ECG (12/12): NSR, anterior and inferior shallow T wave inversions  Labs (10/12): pro-BNP 335 Labs (12/12): CEs negative x 3, K 3.4, creatinine 0.49  PMH: 1. Diabetes mellitus with neuropathy.  2. HTN 3. Hyperlipidemia 4. COPD: On home O2 5. CAD: PCI to RCA in 4/05 with Cypher DES 3.5 x 23 to 90% proximal RCA lesion  Stress myoview in 5/11 with no ischemia or infarction.  6. Breast cancer: Treated 2003-2006.  Lumpectomy, radiation, chemotherapy (Cytoxan, adriamycin).   7. Fibromyalgia  SH: Quit smoking 11 years ago after 75 pack-years.  Married, lives in [place].  [place].  2 children.   FH: CAD  ROS: All systems reviewed and negative except as per HPI.   Current Outpatient Prescriptions  Medication Sig Dispense Refill  . acetaminophen (TYLENOL) 500 MG tablet Take 1,000 mg by mouth every 6 (six) hours as needed.        Marland Kitchen aspirin 81 MG tablet Take 81 mg by mouth daily.        Marland Kitchen atorvastatin (LIPITOR) 80 MG tablet Take 1 tablet (80 mg total) by mouth at bedtime.  30 tablet  12  . ATROVENT HFA 17 MCG/ACT inhaler Inhale 1 puff into the lungs 2 (two) times daily.       . benazepril (LOTENSIN) 5 MG tablet Take 1 tablet (5 mg total) by mouth daily.  30 tablet  11  . carvedilol (COREG)  6.25 MG tablet Take 6.25 mg by mouth 2 (two) times daily with a meal.       . clopidogrel (PLAVIX) 75 MG tablet Take 75 mg by mouth daily.        Marland Kitchen diltiazem (CARTIA XT) 180 MG 24 hr capsule Take 1 capsule (180 mg total) by mouth daily.  90 capsule  3  . Fluticasone-Salmeterol (ADVAIR DISKUS) 250-50 MCG/DOSE AEPB Inhale 1 puff into the lungs 2 (two) times daily.        . furosemide (LASIX) 20 MG tablet Take 1 tablet (20 mg total) by mouth daily.  180 tablet  3  . gabapentin (NEURONTIN) 300 MG capsule Take 300 mg by mouth 3 (three) times daily.        Marland Kitchen ipratropium-albuterol (DUONEB) 0.5-2.5 (3) MG/3ML SOLN 1 treatment every 12 hours SCHEDULED and every 2 hours as needed for coughing or wheezing / shortness of breath  360 mL  11  . LORazepam (ATIVAN) 2 MG tablet Take 1 tablet (2 mg total) by mouth 3 (three) times daily.  90 tablet  5  . metFORMIN (GLUCOPHAGE) 1000 MG tablet Take 1 tablet (1,000 mg total) by mouth 2 (two) times daily with a meal.  60 tablet  5  . omeprazole (PRILOSEC) 40 MG  capsule TAKE ONE CAPSULE BY MOUTH EVERY DAY  90 capsule  3  . potassium chloride (KLOR-CON 10) 10 MEQ CR tablet Take 10 mEq by mouth 2 (two) times daily.        . theophylline (THEOPHYLLINE) 300 MG 12 hr tablet Take 1 tablet (300 mg total) by mouth 2 (two) times daily.  180 tablet  3    BP 114/69  Pulse 96  Ht 5\' 2"  (1.575 m)  Wt 74.39 kg (164 lb)  BMI 30.00 kg/m2 General: NAD, overweight Neck: No JVD, no thyromegaly or thyroid nodule.  Lungs: Clear to auscultation bilaterally with normal respiratory effort. CV: Nondisplaced PMI.  Heart regular S1/S2, no S3/S4, no murmur.  No peripheral edema.  No carotid bruit.  Normal pedal pulses.  Abdomen: Soft, nontender, no hepatosplenomegaly, no distention.  Skin: Intact without lesions or rashes.  Neurologic: Alert and oriented x 3.  Psych: Normal affect. Extremities: No clubbing or cyanosis.  HEENT: Normal.

## 2011-09-22 ENCOUNTER — Other Ambulatory Visit (HOSPITAL_COMMUNITY): Payer: Medicare Other | Admitting: Radiology

## 2011-09-22 ENCOUNTER — Other Ambulatory Visit: Payer: Medicare Other | Admitting: *Deleted

## 2011-09-24 ENCOUNTER — Emergency Department (HOSPITAL_COMMUNITY)
Admission: EM | Admit: 2011-09-24 | Discharge: 2011-09-24 | Disposition: A | Discharge status: Home / Self Care | Payer: Medicare Other | Source: Home / Self Care | Attending: Family Medicine | Admitting: Family Medicine

## 2011-09-24 ENCOUNTER — Encounter (HOSPITAL_COMMUNITY): Payer: Self-pay | Admitting: Emergency Medicine

## 2011-09-24 ENCOUNTER — Telehealth: Payer: Self-pay | Admitting: Family Medicine

## 2011-09-24 DIAGNOSIS — J40 Bronchitis, not specified as acute or chronic: Secondary | ICD-10-CM

## 2011-09-24 MED ORDER — AZITHROMYCIN 250 MG PO TABS: 250.0000 mg | ORAL_TABLET | Freq: Every day | ORAL | Status: AC

## 2011-09-24 MED ORDER — HYDROCODONE-HOMATROPINE 5-1.5 MG/5ML PO SYRP: ORAL_SOLUTION | ORAL | Status: DC

## 2011-09-24 MED ORDER — ALBUTEROL SULFATE (5 MG/ML) 0.5% IN NEBU
INHALATION_SOLUTION | RESPIRATORY_TRACT | Status: AC
  Filled 2011-09-24: qty 1

## 2011-09-24 MED ORDER — IPRATROPIUM BROMIDE 0.02 % IN SOLN
0.5000 mg | Freq: Once | RESPIRATORY_TRACT | Status: AC
  Administered 2011-09-24: 0.5 mg via RESPIRATORY_TRACT

## 2011-09-24 MED ORDER — ALBUTEROL SULFATE (5 MG/ML) 0.5% IN NEBU
2.5000 mg | INHALATION_SOLUTION | Freq: Once | RESPIRATORY_TRACT | Status: AC
  Administered 2011-09-24: 2.5 mg via RESPIRATORY_TRACT

## 2011-09-24 NOTE — ED Notes (Signed)
PT HERE WITH COUGH AND GREENISH PHLEGM,CHEST CONGESTION THAT STARTED X 2 DYS AGO.PT HAS HX COPD,ASTHMA,BRONCHITIS AND IS ON 100% 2LNC AT HOME.WHEEZING THROUGHOUT.SATS 95% R/A

## 2011-09-24 NOTE — Telephone Encounter (Signed)
Maria Gaines wants to talk to Dr. [name].  She is sick and afraid to get out in the weather and she NEEDS her to please order her a Z-pack.

## 2011-09-24 NOTE — ED Provider Notes (Signed)
History     CSN: [id number]  Arrival date & time 09/24/11  1425   First MD Initiated Contact with Patient 09/24/11 1506      Chief Complaint  Patient presents with  . Asthma  . Cough    (Consider location/radiation/quality/duration/timing/severity/associated sxs/prior treatment) HPI Comments: THE PATIENT PRESENTS WITH 2DAY HX OF COUGH AND CHEST CONGESTION. REPORTS GREEN SPUTUM ON OCCASION. NO FEVER. HAS NOTED SOME WHEEZING. NO FEVER. SOME RUNNY NOSE. HX SIG FOR COPD,CAD. ON HOME O2.   The history is provided by the patient.    Past Medical History  Diagnosis Date  . Visual field defect 03/09/11    Visual field defect right eye. Dr Cammy Copa optomotrist. plans rechck 6 m  . Hypertension   . Diabetes mellitus   . COPD (chronic obstructive pulmonary disease)   . Breast cancer 2005    History reviewed. No pertinent past surgical history.  History reviewed. No pertinent family history.  History  Substance Use Topics  . Smoking status: Former Smoker -- 1.5 packs/day for 50 years    Types: Cigarettes    Quit date: 08/31/2000  . Smokeless tobacco: Never Used  . Alcohol Use: No    OB History    Grav Para Term Preterm Abortions TAB SAB Ect Mult Living                  Review of Systems  Constitutional: Negative.   HENT: Negative.   Cardiovascular: Negative.   Gastrointestinal: Negative.   Genitourinary: Negative.   Musculoskeletal: Negative.     Allergies  Aspirin; Codeine; and Zolpidem tartrate  Home Medications   Current Outpatient Rx  Name Route Sig Dispense Refill  . ACETAMINOPHEN 500 MG PO TABS Oral Take 1,000 mg by mouth every 6 (six) hours as needed.      . ASPIRIN 81 MG PO TABS Oral Take 81 mg by mouth daily.      . ATORVASTATIN CALCIUM 80 MG PO TABS Oral Take 1 tablet (80 mg total) by mouth at bedtime. 30 tablet 12  . ATROVENT HFA 17 MCG/ACT IN AERS Inhalation Inhale 1 puff into the lungs 2 (two) times daily.     Marland Kitchen BENAZEPRIL HCL 5 MG PO TABS Oral Take 1  tablet (5 mg total) by mouth daily. 30 tablet 11  . CARVEDILOL 6.25 MG PO TABS Oral Take 6.25 mg by mouth 2 (two) times daily with a meal.     . CLOPIDOGREL BISULFATE 75 MG PO TABS Oral Take 75 mg by mouth daily.      Marland Kitchen DILTIAZEM HCL ER COATED BEADS 180 MG PO CP24 Oral Take 1 capsule (180 mg total) by mouth daily. 90 capsule 3  . FLUTICASONE-SALMETEROL 250-50 MCG/DOSE IN AEPB Inhalation Inhale 1 puff into the lungs 2 (two) times daily.      . FUROSEMIDE 20 MG PO TABS Oral Take 1 tablet (20 mg total) by mouth daily. 180 tablet 3  . GABAPENTIN 300 MG PO CAPS Oral Take 300 mg by mouth 3 (three) times daily.      . IPRATROPIUM-ALBUTEROL 0.5-2.5 (3) MG/3ML IN SOLN  1 treatment every 12 hours SCHEDULED and every 2 hours as needed for coughing or wheezing / shortness of breath 360 mL 11  . LORAZEPAM 2 MG PO TABS Oral Take 1 tablet (2 mg total) by mouth 3 (three) times daily. 90 tablet 5  . METFORMIN HCL 1000 MG PO TABS Oral Take 1 tablet (1,000 mg total) by mouth 2 (two) times  daily with a meal. 60 tablet 5  . OMEPRAZOLE 40 MG PO CPDR  TAKE ONE CAPSULE BY MOUTH EVERY DAY 90 capsule 3  . POTASSIUM CHLORIDE 10 MEQ PO TBCR Oral Take 10 mEq by mouth 2 (two) times daily.      . THEOPHYLLINE 300 MG PO TB12 Oral Take 1 tablet (300 mg total) by mouth 2 (two) times daily. 180 tablet 3  . AZITHROMYCIN 250 MG PO TABS Oral Take 1 tablet (250 mg total) by mouth daily. Take first 2 tablets together, then 1 every day until finished. 6 tablet 0  . HYDROCODONE-HOMATROPINE 5-1.5 MG/5ML PO SYRP  1/2 to one tsp po q 6 hrs prn cough 120 mL 0    BP 127/75  Pulse 87  Temp(Src) 98.2 F (36.8 C) (Oral)  Resp 24  SpO2 95%  Physical Exam  Nursing note and vitals reviewed. Constitutional: She appears well-developed and well-nourished. No distress.  HENT:  Head: Normocephalic and atraumatic.  Mouth/Throat: Oropharynx is clear and moist.  Neck: Normal range of motion. Neck supple.  Cardiovascular: Normal rate and regular  rhythm.   Pulmonary/Chest: Effort normal. No respiratory distress. She has wheezes. She has no rales.       CONGESTED COUGH  Abdominal: Bowel sounds are normal.  Musculoskeletal: Normal range of motion.  Lymphadenopathy:    She has no cervical adenopathy.  Skin: Skin is warm and dry.    ED Course  Procedures (including critical care time) Marked improvement post neb tx Labs Reviewed - No data to display No results found.   1. Bronchitis       MDM          Randa Spike, MD 09/24/11 [phone]

## 2011-09-25 NOTE — Telephone Encounter (Signed)
She already went to [facility]

## 2011-10-11 ENCOUNTER — Other Ambulatory Visit: Payer: Self-pay | Admitting: Family Medicine

## 2011-10-11 NOTE — Telephone Encounter (Signed)
Refill request

## 2011-10-19 ENCOUNTER — Ambulatory Visit: Payer: Medicare Other | Admitting: Family Medicine

## 2011-10-20 ENCOUNTER — Ambulatory Visit: Payer: Medicare Other | Admitting: Family Medicine

## 2011-10-22 ENCOUNTER — Telehealth: Payer: Self-pay | Admitting: Cardiology

## 2011-10-22 NOTE — Telephone Encounter (Signed)
New problem   Patient request return call  [phone]   Experiencing SOB, heart pains, loss of balance, no dizziness at this time

## 2011-10-22 NOTE — Telephone Encounter (Signed)
Pt called and states for the last few days she has had chest pain. This occurs when she goes to bed at night and lasts until she falls asleep. She states she does not recall any pain like this in the past. I reviewed with Dr Shirlee Latch. I have given pt an appt to see Tereso Newcomer tomorrow.

## 2011-10-23 ENCOUNTER — Ambulatory Visit: Payer: Medicare Other | Admitting: Physician Assistant

## 2011-10-26 ENCOUNTER — Telehealth: Payer: Self-pay | Admitting: Family Medicine

## 2011-10-26 NOTE — Telephone Encounter (Signed)
Patient would like to speak with someone who can tell her what medication she is on that are blood thinners.

## 2011-10-26 NOTE — Telephone Encounter (Signed)
Returned call and told patient she was on 2 blood thinners; Plavix and low dose ASA.  She expressed concern that when she lays down at night she can hear the blood pulsing through her neck veins.  She checks her BP daily and it has been normal.  Told her that in the absence of any other symptoms (headache, dizziness, etc) it was probably nothing to worry about.  Explained that if she develops symptoms to call us back.  Patient appreciative.

## 2011-10-27 ENCOUNTER — Telehealth: Payer: Self-pay | Admitting: *Deleted

## 2011-10-27 NOTE — Telephone Encounter (Signed)
Patient calls  Because she has felt sluggish all day and checked BS and reading is 69.  She states she is very worried about this and the fact that it is this low. States she does not check BS regularly . Thinks she checked it this AM and was 69 also. While patient was on phone ask her to recheck . First she checked with her meter with reading if 63 , then checked with husband's meter and was 57. She can not remember exactly what she has eaten today but does know that she ate some collards. Thinks she has eaten cheese and drank orange. Advised her to eat a sandwich now. Consulted with Dr. Deirdre Priest and advised patient to stop  glipizide , leave off metformin tonight .Marland Kitchen Appointment scheduled tomorrow AM. Advised patient to not take meds  tomorrow AM but bring everything with her to appointment and we will help her get straight on her meds.    Much confusion and uncertainty about what she has recently done as far as taking meds or eating  or checking BS.

## 2011-10-28 ENCOUNTER — Ambulatory Visit (INDEPENDENT_AMBULATORY_CARE_PROVIDER_SITE_OTHER): Payer: Medicare Other | Admitting: Family Medicine

## 2011-10-28 DIAGNOSIS — E119 Type 2 diabetes mellitus without complications: Secondary | ICD-10-CM

## 2011-10-28 DIAGNOSIS — F319 Bipolar disorder, unspecified: Secondary | ICD-10-CM

## 2011-10-28 DIAGNOSIS — R410 Disorientation, unspecified: Secondary | ICD-10-CM

## 2011-10-28 DIAGNOSIS — J449 Chronic obstructive pulmonary disease, unspecified: Secondary | ICD-10-CM

## 2011-10-28 DIAGNOSIS — F29 Unspecified psychosis not due to a substance or known physiological condition: Secondary | ICD-10-CM

## 2011-10-28 NOTE — Progress Notes (Signed)
  Subjective:    Patient ID: Maria Gaines, female    DOB: [birth year], 61 y.o.   MRN: [id number]  HPI Here for work in visit to discuss medications  Here with her husband.  States she is confused about her medications and is concerned that her blood sugar was too low.  Reports values in the 60's.  Today fasting glucose in office 118.  Did not take any of her medications last night or this morning.  Husband and patient feel like her memory and cognition have declined over the past several years, but no acute change in recent times.   Reports sadness with crying frequently, endorses recent visual and auditory hallucinations.  Reports hears her husbands car outside but he is still at work.  Sees a shadowy figure, no-one is there.  Full medication review performed by P. Smith.  Patient brought in all medications today.  Review of Systems See HPI    Objective:   Physical Exam GEN: Alert & Oriented to person, time, month.  Was off by one day, No acute distress CV:  Regular Rate & Rhythm, no murmur Respiratory:  Normal work of breathing, CTAB Abd:  + BS, soft, no tenderness to palpation Ext: no pre-tibial edema Neuro: CN 2-12 grossly intact.  Strength 5/5 upper and lower extremities.  Exam non focal.        Assessment & Plan:

## 2011-10-28 NOTE — Assessment & Plan Note (Signed)
a1c improved today- 6.3%  Will continue metformin but hold glipizide for now given reports of lower blood sugars.  Will continue to monitor.

## 2011-10-28 NOTE — Assessment & Plan Note (Signed)
Discussed possibility that undertreated bipolar disorder may be causing some of her mood and cognition symptoms.  We discussed option for psychiatry referral.  Will defer to PCP in follow-up in 1 week- patient working closer toward accepting diagnosis and need for teratment

## 2011-10-28 NOTE — Patient Instructions (Signed)
Wear your oxygen all the time  Keep taking all of your medicines as normally, but stop glipizide for now.  Make follow-up in 1 week to talk with Dr. [name] more about your sadness and things you see and hear

## 2011-10-28 NOTE — Assessment & Plan Note (Signed)
Likely multifactorial due to hypoxia or hypoglycemia, but also psychiatric comorbidity and possible underlying early dementia may be playing a role.  MRI and CT head from the past 6 months show no acute changes, chronic white matter nonspecific changes.  Advised to wear oxygen at all times (on 2 liters chronically but comes to office without it), will d/c glipizide for now, and will return to see PCP to discuss further referral/evaluation to psychiatry

## 2011-10-28 NOTE — Assessment & Plan Note (Signed)
No exacebration today, but O2 low.  Advised to wear supplemental o2 as instructed

## 2011-11-09 ENCOUNTER — Telehealth: Payer: Self-pay | Admitting: Family Medicine

## 2011-11-09 ENCOUNTER — Encounter: Payer: Self-pay | Admitting: Family Medicine

## 2011-11-09 ENCOUNTER — Ambulatory Visit (INDEPENDENT_AMBULATORY_CARE_PROVIDER_SITE_OTHER): Payer: Medicare Other | Admitting: Family Medicine

## 2011-11-09 VITALS — BP 108/60 | HR 88 | Temp 98.4°F | Wt 160.0 lb

## 2011-11-09 DIAGNOSIS — R42 Dizziness and giddiness: Secondary | ICD-10-CM

## 2011-11-09 DIAGNOSIS — R410 Disorientation, unspecified: Secondary | ICD-10-CM

## 2011-11-09 DIAGNOSIS — E119 Type 2 diabetes mellitus without complications: Secondary | ICD-10-CM

## 2011-11-09 DIAGNOSIS — F29 Unspecified psychosis not due to a substance or known physiological condition: Secondary | ICD-10-CM

## 2011-11-09 MED ORDER — MECLIZINE HCL 32 MG PO TABS: 32.0000 mg | ORAL_TABLET | Freq: Three times a day (TID) | ORAL | Status: DC | PRN

## 2011-11-09 NOTE — Patient Instructions (Addendum)
Stop the Lasix. We're going to do some blood work today. I will let you know what this shows.  The medicine that may help with the dizziness is called meclizine. Take this before bed and if you're feeling dizzy. You can take this up to 3 times a day. Next you come see if you're husband can come in with you. Make sure that you are wearing your oxygen every single day. Without wearing it that might be causing some of the symptoms. Come back and see [place] at the end of the week.

## 2011-11-09 NOTE — Telephone Encounter (Signed)
Pt is upset and wants to see Dr [name] right away.  She has an appt on Thurs, but wants to see her today.  She is crying and states that she is falling off the bed, stumbling around and is confused - thinks there is something wrong with her.  Told her that dr [name] is not here today and recommended that the nurse call her.

## 2011-11-09 NOTE — Telephone Encounter (Signed)
Spoke with patient . She states she has been staggering , can't walk straight. Fell off the bed one night.  States she is hurting all over from where she fell off bed. Has appointment with Dr. [name] for 03/14 . Offered her appointment today at 1:30. She will come then

## 2011-11-10 LAB — CBC
MCH: 29.1 pg (ref 26.0–34.0)
Platelets: 270 10*3/uL (ref 150–400)
RBC: 4.19 MIL/uL (ref 3.87–5.11)
WBC: 7 10*3/uL (ref 4.0–10.5)

## 2011-11-10 LAB — COMPREHENSIVE METABOLIC PANEL
Albumin: 4.2 g/dL (ref 3.5–5.2)
BUN: 9 mg/dL (ref 6–23)
CO2: 30 mEq/L (ref 19–32)
Calcium: 10 mg/dL (ref 8.4–10.5)
Chloride: 93 mEq/L — ABNORMAL LOW (ref 96–112)
Glucose, Bld: 154 mg/dL — ABNORMAL HIGH (ref 70–99)
Potassium: 4 mEq/L (ref 3.5–5.3)
Sodium: 136 mEq/L (ref 135–145)
Total Protein: 7.1 g/dL (ref 6.0–8.3)

## 2011-11-10 LAB — THEOPHYLLINE LEVEL: Theophylline Lvl: 21.3 ug/mL — ABNORMAL HIGH (ref 10.0–20.0)

## 2011-11-11 ENCOUNTER — Telehealth: Payer: Self-pay | Admitting: Family Medicine

## 2011-11-11 DIAGNOSIS — R42 Dizziness and giddiness: Secondary | ICD-10-CM | POA: Insufficient documentation

## 2011-11-11 NOTE — Assessment & Plan Note (Signed)
Evident initially. However this resolved by the end of the conversation. Unclear etiology. She does have a negative CT scan of her head from August of last year.

## 2011-11-11 NOTE — Assessment & Plan Note (Signed)
Unclear etiology. She has this during her oxygen daily does not have it with her. Her pulse ox here in clinic was 94%. Her CBG was 170. I do not think hypoglycemia is causing her symptoms as she did have some symptoms with me. She does appear to be dehydrated as her orthostatics were positive hearing clinic. However that would not explain her Romberg symptoms. Regards check CMET, CBC, and theophylline level as she is taking that. Also plan to stop her Lasix for the next 2 days as she does appear dehydrated. She is to followup with me later this week.

## 2011-11-11 NOTE — Telephone Encounter (Signed)
Pt is asking to speak with nurse - states it's urgent

## 2011-11-11 NOTE — Telephone Encounter (Signed)
Called patient and she states she has an area on the side of vagina that she thinks needs to be removed. States it is a dry area, not like a boil. Reminded she has appointment with Dr. [name] on tomorrow at 11:30. She will come then.

## 2011-11-11 NOTE — Telephone Encounter (Signed)
Called and discussed theophylline level with patient.  Mildly elevated, not sure if cause of symptoms.  She has taken one dose today, told her to hold dose tonight and tomorrow morning. She has an appointment scheduled tomorrow with her PCP. Encouraged to keep that appointment. Patient expressed gratitude and that she will keep her appointment.

## 2011-11-11 NOTE — Progress Notes (Signed)
  Subjective:    Patient ID: Maria Gaines, female    DOB: [birth year], 61 y.o.   MRN: [id number]  HPI #[id number]. 61 year old female here for vertiginous symptoms:  Patient has had worsening vertigo symptoms that she described as "wooziness" for the past month. She states this comes and goes. However for the past 5 days it has been pretty consistent. She states it occurs most times during the day. Somewhat worse in the evening. She does have a sister who died from and "unpronounceable disease" and she watched her sister die over 7 years. She wants to be sure she does not have something similar. She did have one episode of falling out of bed last Wednesday which was 5 days ago and when this episode started. She did not strike her head. She did land on her left side. She notes some bruising on her wrist. She has full range of motion of the hand. She's had no fevers or chills. No nausea or vomiting. No diarrhea. Occasionally lightheaded when she first stands.  The following portions of the patient's history were reviewed and updated as appropriate: allergies, current medications, past medical history, family and social history, and problem list.  Patient is a nonsmoker.   Review of Systems See HPI above for review of systems.       Objective:   Physical Exam Gen:  Alert, cooperative patient who appears stated age in no acute distress.  She is lying on the exam table when I walked in the room.  She sat up when I walked in and had to reach out to hold the wall for balance. She seemed somewhat confused when I first started talking with her but became more coherent conversation went on. HEENT:  [place]/AT.  EOMI, PERRL.  MMM, tonsils non-erythematous, non-edematous.  External ears WNL, Bilateral TM's normal without retraction, redness or bulging.  Neck:  No thyromegaly Cardiac:  Regular rate and rhythm without murmur auscultated.  Good S1/S2. Pulm:  Clear to auscultation bilaterally with good air movement.  No  wheezes or rales noted.   Abd:  Soft/nondistended/nontender.  Good bowel sounds throughout all four quadrants.  No masses noted.  Neuro: Initially seemed somewhat confused. However became much more coherent as conversation continued. Alert oriented x3. Cranial nerves II through XII intact. She did have nystagmus for 6-8 beats both right and left lateral gaze. Sensation intact throughout. Motor strength 5 out of 5 throughout. Finger to nose slightly delayed but basically within normal limits. Romberg positive for consistently falling backwards whenever she stood and close her eyes.       Assessment & Plan:

## 2011-11-12 ENCOUNTER — Inpatient Hospital Stay: Payer: Self-pay | Admitting: Family Medicine

## 2011-11-12 ENCOUNTER — Encounter (HOSPITAL_COMMUNITY): Payer: Self-pay | Admitting: General Practice

## 2011-11-12 ENCOUNTER — Inpatient Hospital Stay (HOSPITAL_COMMUNITY): Payer: Medicare Other

## 2011-11-12 ENCOUNTER — Ambulatory Visit: Payer: Medicare Other | Admitting: Family Medicine

## 2011-11-12 ENCOUNTER — Inpatient Hospital Stay (HOSPITAL_COMMUNITY)
Admission: AD | Admit: 2011-11-12 | Discharge: 2011-11-13 | DRG: 948 | Disposition: A | Discharge status: Home / Self Care | Payer: Medicare Other | Source: Ambulatory Visit | Attending: Family Medicine | Admitting: Family Medicine

## 2011-11-12 VITALS — BP 102/62 | HR 60 | Temp 98.7°F | Ht 63.0 in | Wt 160.0 lb

## 2011-11-12 DIAGNOSIS — Z7982 Long term (current) use of aspirin: Secondary | ICD-10-CM

## 2011-11-12 DIAGNOSIS — F319 Bipolar disorder, unspecified: Secondary | ICD-10-CM

## 2011-11-12 DIAGNOSIS — Z87891 Personal history of nicotine dependence: Secondary | ICD-10-CM

## 2011-11-12 DIAGNOSIS — I10 Essential (primary) hypertension: Secondary | ICD-10-CM | POA: Diagnosis present

## 2011-11-12 DIAGNOSIS — E119 Type 2 diabetes mellitus without complications: Secondary | ICD-10-CM

## 2011-11-12 DIAGNOSIS — J449 Chronic obstructive pulmonary disease, unspecified: Secondary | ICD-10-CM | POA: Diagnosis present

## 2011-11-12 DIAGNOSIS — E785 Hyperlipidemia, unspecified: Secondary | ICD-10-CM | POA: Diagnosis present

## 2011-11-12 DIAGNOSIS — K219 Gastro-esophageal reflux disease without esophagitis: Secondary | ICD-10-CM | POA: Diagnosis present

## 2011-11-12 DIAGNOSIS — R4182 Altered mental status, unspecified: Secondary | ICD-10-CM

## 2011-11-12 DIAGNOSIS — Z853 Personal history of malignant neoplasm of breast: Secondary | ICD-10-CM

## 2011-11-12 DIAGNOSIS — C50919 Malignant neoplasm of unspecified site of unspecified female breast: Secondary | ICD-10-CM

## 2011-11-12 DIAGNOSIS — Z299 Encounter for prophylactic measures, unspecified: Secondary | ICD-10-CM

## 2011-11-12 DIAGNOSIS — I2510 Atherosclerotic heart disease of native coronary artery without angina pectoris: Secondary | ICD-10-CM | POA: Diagnosis present

## 2011-11-12 DIAGNOSIS — Z79899 Other long term (current) drug therapy: Secondary | ICD-10-CM

## 2011-11-12 DIAGNOSIS — R413 Other amnesia: Principal | ICD-10-CM | POA: Diagnosis present

## 2011-11-12 DIAGNOSIS — Z7902 Long term (current) use of antithrombotics/antiplatelets: Secondary | ICD-10-CM

## 2011-11-12 DIAGNOSIS — F341 Dysthymic disorder: Secondary | ICD-10-CM | POA: Diagnosis present

## 2011-11-12 DIAGNOSIS — Z9861 Coronary angioplasty status: Secondary | ICD-10-CM

## 2011-11-12 DIAGNOSIS — J4489 Other specified chronic obstructive pulmonary disease: Secondary | ICD-10-CM | POA: Diagnosis present

## 2011-11-12 DIAGNOSIS — R404 Transient alteration of awareness: Secondary | ICD-10-CM

## 2011-11-12 DIAGNOSIS — Z955 Presence of coronary angioplasty implant and graft: Secondary | ICD-10-CM

## 2011-11-12 DIAGNOSIS — I509 Heart failure, unspecified: Secondary | ICD-10-CM | POA: Diagnosis present

## 2011-11-12 HISTORY — DX: Gastro-esophageal reflux disease without esophagitis: K21.9

## 2011-11-12 HISTORY — DX: Dizziness and giddiness: R42

## 2011-11-12 HISTORY — DX: Shortness of breath: R06.02

## 2011-11-12 LAB — DIFFERENTIAL
Basophils Absolute: 0 10*3/uL (ref 0.0–0.1)
Lymphocytes Relative: 28 % (ref 12–46)
Monocytes Absolute: 0.5 10*3/uL (ref 0.1–1.0)
Neutro Abs: 4.9 10*3/uL (ref 1.7–7.7)
Neutrophils Relative %: 63 % (ref 43–77)

## 2011-11-12 LAB — URINALYSIS, ROUTINE W REFLEX MICROSCOPIC
Hgb urine dipstick: NEGATIVE
Leukocytes, UA: NEGATIVE
Nitrite: NEGATIVE
Specific Gravity, Urine: 1.014 (ref 1.005–1.030)
Urobilinogen, UA: 1 mg/dL (ref 0.0–1.0)

## 2011-11-12 LAB — COMPREHENSIVE METABOLIC PANEL
AST: 12 U/L (ref 0–37)
BUN: 11 mg/dL (ref 6–23)
CO2: 34 mEq/L — ABNORMAL HIGH (ref 19–32)
Calcium: 9.6 mg/dL (ref 8.4–10.5)
Creatinine, Ser: 0.62 mg/dL (ref 0.50–1.10)
GFR calc non Af Amer: >90 mL/min (ref >90)

## 2011-11-12 LAB — GLUCOSE, CAPILLARY: Glucose-Capillary: 226 mg/dL — ABNORMAL HIGH (ref 70–99)

## 2011-11-12 LAB — TSH: TSH: 1.145 u[IU]/mL (ref 0.350–4.500)

## 2011-11-12 LAB — CBC
MCH: 30 pg (ref 26.0–34.0)
MCV: 90.6 fL (ref 78.0–100.0)
Platelets: 275 10*3/uL (ref 150–400)
RBC: 4.24 MIL/uL (ref 3.87–5.11)
RDW: 12.9 % (ref 11.5–15.5)

## 2011-11-12 MED ORDER — FLUTICASONE-SALMETEROL 250-50 MCG/DOSE IN AEPB
2.0000 | INHALATION_SPRAY | Freq: Two times a day (BID) | RESPIRATORY_TRACT | Status: DC
  Administered 2011-11-13: 2 via RESPIRATORY_TRACT
  Filled 2011-11-12: qty 14

## 2011-11-12 MED ORDER — ATORVASTATIN CALCIUM 80 MG PO TABS
80.0000 mg | ORAL_TABLET | Freq: Every day | ORAL | Status: DC
  Filled 2011-11-12: qty 1

## 2011-11-12 MED ORDER — ALBUTEROL SULFATE (5 MG/ML) 0.5% IN NEBU: 2.5000 mg | INHALATION_SOLUTION | Freq: Four times a day (QID) | RESPIRATORY_TRACT | Status: DC

## 2011-11-12 MED ORDER — IPRATROPIUM BROMIDE 0.02 % IN SOLN: 500.0000 ug | Freq: Four times a day (QID) | RESPIRATORY_TRACT | Status: DC

## 2011-11-12 MED ORDER — INSULIN ASPART 100 UNIT/ML ~~LOC~~ SOLN
0.0000 [IU] | Freq: Three times a day (TID) | SUBCUTANEOUS | Status: DC
  Administered 2011-11-13: 1 [IU] via SUBCUTANEOUS

## 2011-11-12 MED ORDER — IPRATROPIUM BROMIDE 0.02 % IN SOLN: 0.5000 mg | Freq: Four times a day (QID) | RESPIRATORY_TRACT | Status: DC

## 2011-11-12 MED ORDER — BENAZEPRIL HCL 5 MG PO TABS
5.0000 mg | ORAL_TABLET | Freq: Every day | ORAL | Status: DC
  Administered 2011-11-13: 5 mg via ORAL
  Filled 2011-11-12: qty 1

## 2011-11-12 MED ORDER — POTASSIUM CHLORIDE CRYS ER 10 MEQ PO TBCR
10.0000 meq | EXTENDED_RELEASE_TABLET | Freq: Two times a day (BID) | ORAL | Status: DC
  Administered 2011-11-12 – 2011-11-13 (×2): 10 meq via ORAL
  Filled 2011-11-12 (×3): qty 1

## 2011-11-12 MED ORDER — DILTIAZEM HCL ER COATED BEADS 180 MG PO CP24
180.0000 mg | ORAL_CAPSULE | Freq: Every day | ORAL | Status: DC
  Administered 2011-11-13: 180 mg via ORAL
  Filled 2011-11-12: qty 1

## 2011-11-12 MED ORDER — LORAZEPAM 1 MG PO TABS
2.0000 mg | ORAL_TABLET | Freq: Three times a day (TID) | ORAL | Status: DC | PRN
  Administered 2011-11-12 – 2011-11-13 (×2): 2 mg via ORAL
  Filled 2011-11-12 (×2): qty 2

## 2011-11-12 MED ORDER — IPRATROPIUM-ALBUTEROL 0.5-2.5 (3) MG/3ML IN SOLN: 3.0000 mL | Freq: Four times a day (QID) | RESPIRATORY_TRACT | Status: DC

## 2011-11-12 MED ORDER — NAPHAZOLINE-PHENIRAMINE 0.025-0.3 % OP SOLN
1.0000 [drp] | Freq: Four times a day (QID) | OPHTHALMIC | Status: DC | PRN
  Filled 2011-11-12: qty 15

## 2011-11-12 MED ORDER — PANTOPRAZOLE SODIUM 40 MG PO TBEC
40.0000 mg | DELAYED_RELEASE_TABLET | Freq: Every day | ORAL | Status: DC
Start: 2011-11-12 — End: 2011-11-13
  Administered 2011-11-12 – 2011-11-13 (×2): 40 mg via ORAL
  Filled 2011-11-12 (×2): qty 1

## 2011-11-12 MED ORDER — CARVEDILOL 3.125 MG PO TABS
3.1250 mg | ORAL_TABLET | Freq: Two times a day (BID) | ORAL | Status: DC
  Administered 2011-11-13: 3.125 mg via ORAL
  Filled 2011-11-12 (×4): qty 1

## 2011-11-12 MED ORDER — HEPARIN SODIUM (PORCINE) 5000 UNIT/ML IJ SOLN
5000.0000 [IU] | Freq: Three times a day (TID) | INTRAMUSCULAR | Status: DC
  Administered 2011-11-12 – 2011-11-13 (×3): 5000 [IU] via SUBCUTANEOUS
  Filled 2011-11-12 (×6): qty 1

## 2011-11-12 MED ORDER — METFORMIN HCL 500 MG PO TABS
1000.0000 mg | ORAL_TABLET | Freq: Two times a day (BID) | ORAL | Status: DC
  Administered 2011-11-13: 1000 mg via ORAL
  Filled 2011-11-12 (×3): qty 2

## 2011-11-12 MED ORDER — ASPIRIN 81 MG PO CHEW
81.0000 mg | CHEWABLE_TABLET | Freq: Every day | ORAL | Status: DC
  Administered 2011-11-13: 81 mg via ORAL
  Filled 2011-11-12: qty 1

## 2011-11-12 MED ORDER — CLOPIDOGREL BISULFATE 75 MG PO TABS
75.0000 mg | ORAL_TABLET | Freq: Every day | ORAL | Status: DC
  Administered 2011-11-13: 75 mg via ORAL
  Filled 2011-11-12: qty 1

## 2011-11-12 MED ORDER — GABAPENTIN 300 MG PO CAPS
300.0000 mg | ORAL_CAPSULE | Freq: Two times a day (BID) | ORAL | Status: DC
  Administered 2011-11-13 (×2): 300 mg via ORAL
  Filled 2011-11-12 (×3): qty 1

## 2011-11-12 NOTE — Progress Notes (Signed)
Subjective:    Patient ID: Maria Gaines, female    DOB: [birth year], 61 y.o.   MRN: [id number] [facility] ATTENDING ADMISSION NOTE HPI  Several months of increasing confusion and unsteadiness----was seen earlier in teh week for dizziness. Had a fall from bed---trying to get up and became dizzy. Husband accompanies her and he says her memory is "shot". She will eat dinner and within a few mionutes will swear she has not eaten. She still questions whether or not she truly has bipolar disorder---says she does not "feel" like she has bipolar disorder. Notably has been off her risperdal for several months now.  Has also had decrease in appetite and some weight  Loss.  Review of Systems  Constitutional: Positive for activity change, appetite change, fatigue and unexpected weight change. Negative for fever, chills and diaphoresis.  HENT: Negative for hearing loss, neck pain, neck stiffness and tinnitus.   Eyes: Negative for photophobia, pain and visual disturbance.  Respiratory: Negative for cough, choking and chest tightness.   Cardiovascular: Negative for chest pain and palpitations.  Gastrointestinal: Negative for abdominal distention.  Genitourinary: Negative for frequency, hematuria, vaginal bleeding, vaginal discharge, difficulty urinating and pelvic pain.       Complains of a new :warty" type lesion for last 6 months n table on left labia. Has not had it evaluated by physician.  Musculoskeletal: Positive for back pain and gait problem. Negative for joint swelling.  Skin: Negative for color change.  Psychiatric/Behavioral: Positive for confusion, sleep disturbance and decreased concentration. Negative for suicidal ideas, hallucinations, behavioral problems, self-injury, dysphoric mood and agitation. The patient is not nervous/anxious and is not hyperactive.        Objective:   Physical Exam  Constitutional: She is oriented to person, place, and time. She appears well-developed and  well-nourished.  HENT:  Head: Normocephalic and atraumatic.  Mouth/Throat: Oropharynx is clear and moist.  Eyes: Conjunctivae and EOM are normal. Pupils are equal, round, and reactive to light. Scleral icterus is present.  Neck: Neck supple. No thyromegaly present.  Cardiovascular: Normal rate, regular rhythm and normal heart sounds.   Pulmonary/Chest: Effort normal and breath sounds normal.  Abdominal: Soft.  Musculoskeletal: Normal range of motion. She exhibits no edema.  Lymphadenopathy:    She has no cervical adenopathy.  Neurological: She is alert and oriented to person, place, and time. No cranial nerve deficit. She exhibits normal muscle tone. Coordination abnormal.       Abnormal rhomberg--unsteady. No pronator drift.   Skin:       Unusual discoloration of  Area just proximal to her finger nail beds---hyperpigmented. Nail bed color and perfusion is normal. Also some discolroation of her area on her face around her chin but this is somewhat obscured by her heavy makeup.    Breast exam--no specific masses---well healed scar left upper breast. Some mild nodularity noted but seems consistent with fbrocystic breast disease.  Patient Active Problem List  Diagnoses  . BREAST CANCER  . DIABETES MELLITUS II, UNCOMPLICATED  . HYPERCHOLESTEROLEMIA  . BIPOLAR DISORDER  . PANIC ATTACKS  . HYPERTENSION, BENIGN SYSTEMIC  . CORONARY, ARTERIOSCLEROSIS  . CHF - EJECTION FRACTION < 50%  . ALLERGIC RHINITIS  . CHRONIC OBSTRUCTIVE PULMONARY DISEASE, ACUTE EXACERBATION  . COPD  . GASTROESOPHAGEAL REFLUX, NO ESOPHAGITIS  . BACK PAIN, CHRONIC, INTERMITTENT  . INSOMNIA  . OXYGEN-USE OF SUPPLEMENTAL  . Memory change  . Confusion  . Presence of stent in Proximal Right Coronary Artery   . Dysuria  .  Vertigo   Current Outpatient Prescriptions on File Prior to Visit  Medication Sig Dispense Refill  . acetaminophen (TYLENOL) 500 MG tablet Take 1,000 mg by mouth every 6 (six) hours as needed.         Marland Kitchen aspirin 81 MG tablet Take 81 mg by mouth daily.        Marland Kitchen atorvastatin (LIPITOR) 80 MG tablet TAKE 1 TABLET (80 MG TOTAL) BY MOUTH AT BEDTIME.  30 tablet  9  . ATROVENT HFA 17 MCG/ACT inhaler Inhale 1 puff into the lungs 2 (two) times daily.       . benazepril (LOTENSIN) 5 MG tablet Take 1 tablet (5 mg total) by mouth daily.  30 tablet  11  . carvedilol (COREG) 6.25 MG tablet Take 6.25 mg by mouth 2 (two) times daily with a meal.       . clopidogrel (PLAVIX) 75 MG tablet Take 75 mg by mouth daily.        Marland Kitchen diltiazem (CARTIA XT) 180 MG 24 hr capsule Take 1 capsule (180 mg total) by mouth daily.  90 capsule  3  . Fluticasone-Salmeterol (ADVAIR DISKUS) 250-50 MCG/DOSE AEPB Inhale 1 puff into the lungs 2 (two) times daily.        . furosemide (LASIX) 20 MG tablet Take 20 mg by mouth 2 (two) times daily.      Marland Kitchen gabapentin (NEURONTIN) 300 MG capsule Take 300 mg by mouth 2 (two) times daily.       Marland Kitchen glipiZIDE (GLUCOTROL) 5 MG tablet TAKE 1 TABLET (5 MG TOTAL) BY MOUTH DAILY.  30 tablet  3  . HYDROcodone-homatropine (HYCODAN) 5-1.5 MG/5ML syrup 1/2 to one tsp po q 6 hrs prn cough  120 mL  0  . ipratropium-albuterol (DUONEB) 0.5-2.5 (3) MG/3ML SOLN 1 treatment every 12 hours SCHEDULED and every 2 hours as needed for coughing or wheezing / shortness of breath  360 mL  11  . LORazepam (ATIVAN) 2 MG tablet Take 1 tablet (2 mg total) by mouth 3 (three) times daily.  90 tablet  5  . meclizine (ANTIVERT) 32 MG tablet Take 1 tablet (32 mg total) by mouth 3 (three) times daily as needed.  30 tablet  0  . metFORMIN (GLUCOPHAGE) 1000 MG tablet TAKE 1 TABLET (1,000 MG TOTAL) BY MOUTH 2 (TWO) TIMES DAILY WITH A MEAL.  60 tablet  4  . omeprazole (PRILOSEC) 40 MG capsule TAKE ONE CAPSULE BY MOUTH EVERY DAY  90 capsule  3  . potassium chloride (KLOR-CON 10) 10 MEQ CR tablet Take 10 mEq by mouth 2 (two) times daily.        . theophylline (THEOPHYLLINE) 300 MG 12 hr tablet Take 1 tablet (300 mg total) by mouth 2 (two)  times daily.  180 tablet  3    FOR SOCIAL HX/ PMH/ PSH PLEASE SEE EPIC CHART.    Assessment & Plan:  Mental status changes--has been gradually having some confusion and memory issues but since I last saw her this has become severe. It is more than can be worked up as an outpatient. Will admit Get MRI brain w w/o (hx of breast cancer so mets alwats a concern).  Neuro consult Psych consult---it does not seem possible that  this could be related to her stopping risperidone as the change is not mania or depression and the change is fairly significant, but not impossible so will get psych eval Unusual pigmentation of skin---consider metabolic cause--- could consider heavy metals testing etc  altho esoteric---will consider this more if the above testing not conclusive CXR, CBC, CMP, TSH

## 2011-11-12 NOTE — H&P (Addendum)
Maria Gaines is an 61 y.o. female.    HPI: This is a 61 year old female of [facility] who was directly admitted from clinic due to concerns regarding her confusion and lapses in memory for the past few months.  The patient reports starting to fall out of bed while sleeping that started 3 months ago. Since then, she reports having difficulties with her memory. She goes to do something and then forgets what she needs to do. She is also having difficulty remembering which medications she needs to take. Her husband who is presents during this interview also confirms that she is has difficulty remembering things that she usually did not have difficulty remembering.  Patient was seen yesterday by one of the [facility] for weakness. Her theophylline level was found elevated and she was asked to hold taking this medication. She reports having generalized weakness and lightheadedness. She denies vertigo.  The patient was admitted for work-up of her confusion/acute poor memory that per her PCP has become severe compared to her baseline and gait abnormalities.  Past Medical History  Diagnosis Date  . Visual field defect 03/09/11    Visual field defect right eye. Dr Cammy Copa optomotrist. plans rechck 6 m  . Hypertension   . Diabetes mellitus   . COPD (chronic obstructive pulmonary disease)   . Breast cancer 2005  Distant diagnosis of bipolar disorder. Unclear how this was diagnosed. Denies manic symptoms. Prescribed risperidone, but she never took it because she did not think she had bipolar.   No past surgical history on file.  No family history on file. Social History:  reports that she quit smoking about 11 years ago. Her smoking use included Cigarettes. She has a 75 pack-year smoking history. She has never used smokeless tobacco. She reports that she does not drink alcohol. Her drug history not on file.  Allergies:  Allergies  Allergen Reactions  . Aspirin     Per pt cannot take high dose ASA    . Codeine     "unclear of where i was at and who anyone was"  . Zolpidem Tartrate     REACTION: sleepwalking    Medications Prior to Admission  Medication Dose Route Frequency Provider Last Rate Last Dose  . heparin injection 5,000 Units  5,000 Units Subcutaneous Q8H Ardyth Gal, MD       Medications Prior to Admission  Medication Sig Dispense Refill  . acetaminophen (TYLENOL) 500 MG tablet Take 1,000 mg by mouth every 6 (six) hours as needed. For pain      . aspirin 81 MG tablet Take 81 mg by mouth daily.        Marland Kitchen atorvastatin (LIPITOR) 80 MG tablet TAKE 1 TABLET (80 MG TOTAL) BY MOUTH AT BEDTIME.  30 tablet  9  . benazepril (LOTENSIN) 5 MG tablet Take 1 tablet (5 mg total) by mouth daily.  30 tablet  11  . carvedilol (COREG) 6.25 MG tablet Take 6.25 mg by mouth 2 (two) times daily with a meal.       . clopidogrel (PLAVIX) 75 MG tablet Take 75 mg by mouth daily.        Marland Kitchen diltiazem (CARTIA XT) 180 MG 24 hr capsule Take 1 capsule (180 mg total) by mouth daily.  90 capsule  3  . Fluticasone-Salmeterol (ADVAIR DISKUS) 250-50 MCG/DOSE AEPB Inhale 2 puffs into the lungs 2 (two) times daily.       Marland Kitchen gabapentin (NEURONTIN) 300 MG capsule Take 300 mg by  mouth 2 (two) times daily.       Marland Kitchen ipratropium-albuterol (DUONEB) 0.5-2.5 (3) MG/3ML SOLN 1 treatment every 12 hours SCHEDULED and every 2 hours as needed for coughing or wheezing / shortness of breath  360 mL  11  . LORazepam (ATIVAN) 2 MG tablet Take 1 tablet (2 mg total) by mouth 3 (three) times daily.  90 tablet  5  . metFORMIN (GLUCOPHAGE) 1000 MG tablet TAKE 1 TABLET (1,000 MG TOTAL) BY MOUTH 2 (TWO) TIMES DAILY WITH A MEAL.  60 tablet  4  . omeprazole (PRILOSEC) 40 MG capsule TAKE ONE CAPSULE BY MOUTH EVERY DAY  90 capsule  3  . potassium chloride (KLOR-CON 10) 10 MEQ CR tablet Take 10 mEq by mouth 2 (two) times daily.        . meclizine (ANTIVERT) 32 MG tablet Take 1 tablet (32 mg total) by mouth 3 (three) times daily as needed.  30  tablet  0    Results for orders placed during the hospital encounter of 11/12/11 (from the past 48 hour(s))  CBC     Status: Normal   Collection Time   11/12/11  1:41 PM      Component Value Range Comment   WBC 7.8  4.0 - 10.5 (K/uL)    RBC 4.24  3.87 - 5.11 (MIL/uL)    Hemoglobin 12.7  12.0 - 15.0 (g/dL)    HCT 16.1  09.6 - 04.5 (%)    MCV 90.6  78.0 - 100.0 (fL)    MCH 30.0  26.0 - 34.0 (pg)    MCHC 33.1  30.0 - 36.0 (g/dL)    RDW 40.9  81.1 - 91.4 (%)    Platelets 275  150 - 400 (K/uL)   COMPREHENSIVE METABOLIC PANEL     Status: Abnormal   Collection Time   11/12/11  1:41 PM      Component Value Range Comment   Sodium 136  135 - 145 (mEq/L)    Potassium 3.9  3.5 - 5.1 (mEq/L)    Chloride 94 (*) 96 - 112 (mEq/L)    CO2 34 (*) 19 - 32 (mEq/L)    Glucose, Bld 164 (*) 70 - 99 (mg/dL)    BUN 11  6 - 23 (mg/dL)    Creatinine, Ser 7.82  0.50 - 1.10 (mg/dL)    Calcium 9.6  8.4 - 10.5 (mg/dL)    Total Protein 7.4  6.0 - 8.3 (g/dL)    Albumin 3.9  3.5 - 5.2 (g/dL)    AST 12  0 - 37 (U/L)    ALT 10  0 - 35 (U/L)    Alkaline Phosphatase 87  39 - 117 (U/L)    Total Bilirubin 0.2 (*) 0.3 - 1.2 (mg/dL)    GFR calc non Af Amer >90  >90 (mL/min)    GFR calc Af Amer >90  >90 (mL/min)   DIFFERENTIAL     Status: Normal   Collection Time   11/12/11  1:41 PM      Component Value Range Comment   Neutrophils Relative 63  43 - 77 (%)    Neutro Abs 4.9  1.7 - 7.7 (K/uL)    Lymphocytes Relative 28  12 - 46 (%)    Lymphs Abs 2.1  0.7 - 4.0 (K/uL)    Monocytes Relative 7  3 - 12 (%)    Monocytes Absolute 0.5  0.1 - 1.0 (K/uL)    Eosinophils Relative 3  0 - 5 (%)  Eosinophils Absolute 0.2  0.0 - 0.7 (K/uL)    Basophils Relative 0  0 - 1 (%)    Basophils Absolute 0.0  0.0 - 0.1 (K/uL)    No results found.  ROS Excerpted from attending's H&P: Constitutional: Positive for activity change, appetite change, fatigue and unexpected weight change. Negative for fever, chills and diaphoresis.    HENT: Negative for hearing loss, neck pain, neck stiffness and tinnitus.  Eyes: Negative for photophobia, pain and visual disturbance.  Respiratory: Negative for cough, choking and chest tightness.  Cardiovascular: Negative for chest pain and palpitations.  Gastrointestinal: Negative for abdominal distention.  Genitourinary: Negative for frequency, hematuria, vaginal bleeding, vaginal discharge, difficulty urinating and pelvic pain.  Skin: new warty lesion on left labia Musculoskeletal: Positive for back pain and gait problem. Negative for joint swelling.  Skin: Negative for color change.  Psychiatric/Behavioral: Positive for confusion, sleep disturbance and decreased concentration. Negative for suicidal ideas, hallucinations, behavioral problems, self-injury, dysphoric mood and agitation. The patient is not nervous/anxious and is not hyperactive.   Blood pressure 102/60, pulse 83, temperature 98.3 F (36.8 C), resp. rate 16, SpO2 98.00%. Physical Exam  Gen: NAD, accompanied by husband HEENT: PERRL, conjunctiva normal, ears with wax bilaterally but non-tender and -erythematous; no rhinorrhea; no neck LAD; no oropharyngeal exudates or erythema Breast: well-healed scar left upper breast; mild nodularity consistent with fibrocystic breast disease without concerning masses Pulm: audible wheezing; NI WOB at rest but moderate increased work-of-breathing with slow ambulation; no ronchi or rales CV: RRR, distant heart sounds, no murmurs auscultated Abd: NABS, soft, non-tender, obese Ext: no edema; warm, dry; 2+ pedal pulses Skin: 1x1cm verrucous lesion near left labia without warmth or erythema; hyperpigmented areas of skin proximal to finger nail beds with normal nail beds and perfusion Neuro: alert and oriented x4, 4-5/5 strength upper and lower extremities, 2+ popliteal and brachioradial reflexes, no clonus bilaterally; leans and veers towards left-side with ambulation; Romberg: unsteady, leaning  forwards and backwards but without pronator drift; sensation intact throughout  MMSE: 24/30  CMP     Component Value Date/Time   NA 136 11/12/2011 1341   K 3.9 11/12/2011 1341   CL 94* 11/12/2011 1341   CO2 34* 11/12/2011 1341   GLUCOSE 164* 11/12/2011 1341   BUN 11 11/12/2011 1341   CREATININE 0.62 11/12/2011 1341   CREATININE 0.62 11/09/2011 1428   CALCIUM 9.6 11/12/2011 1341   PROT 7.4 11/12/2011 1341   ALBUMIN 3.9 11/12/2011 1341   AST 12 11/12/2011 1341   ALT 10 11/12/2011 1341   ALKPHOS 87 11/12/2011 1341   BILITOT 0.2* 11/12/2011 1341   GFRNONAA >90 11/12/2011 1341   GFRAA >90 11/12/2011 1341    Lab 11/12/11 1341 11/09/11 1428  HGB 12.7 12.2  HCT 38.4 39.8  WBC 7.8 7.0  PLT 275 270    Assessment/Plan This is a 61 year old [place] female with a history of breast cancer s/p lumpectomy, chemotherapy, radiation and history of anxiety/?bipolar disorder/insomnia presenting with below symptoms: Confusion and decreased memory past few months--documented to have memory problems since August 2012, which had been attributed to psychiatric issues (see below) Abnormal gait and unsteadiness with intact strength, sensation, and reflexes Areas of hyperpigmentation proximal to nail beds -Will check MRI, especially with history of breast cancer to rule-out brain metastasis. Consider consulting neurology.  -Follow-up TSH. CMET and CBC relatively WNL.   HEME/ONC History of breast cancer -Monitor.   CV History of T2DM History of HLD History of CAD s/p stents  History of HTN History of CHF -VS stable -Continue home diltiazem XT 180 qd, Coreg 6.25 bid, benazepril 5 -Continue home ASA/Plavix (s/p stents)  PULM Wheezing on admission with normal oxygen saturations  History of asthma and emphysema--followed by outpatient pulmonologist -Duonebs prn, Advair 250/50 2 puffs bid -Follow-up CXR  PSYCH History of ?bipolar disorder History of panic attacks History of insomnia -Questionable  diagnosis of bipolar disorder, which patient denies. Patient denies ever taking risperidone, however, per PCP's note, the patient was on this medication in the past. Her PCP reports patient had diagnosis of bipolar disorder when patient started seeing PCP. She has a history concerning for anxiety and phobia with possible auditory hallucinations. Based on previous OV notes, since the patient refused to take risperidone, she was put on Ativan for her anxiety starting October 2012>>>will change home Ativan 2mg  tid to prn -Continue home gabapentin  ID Complaining of dysuria recently -Follow-up UA/urine culture  FEN/GI History of GERD -Protonix PO  PPx -DVT PPx: heparin SQ -SUP: Protonix  Disposition: Pending work-up.    OH PARK, Amye Grego 11/12/2011, 3:24 PM

## 2011-11-13 ENCOUNTER — Other Ambulatory Visit: Payer: Self-pay

## 2011-11-13 LAB — GLUCOSE, CAPILLARY
Glucose-Capillary: 149 mg/dL — ABNORMAL HIGH (ref 70–99)
Glucose-Capillary: 117 mg/dL — ABNORMAL HIGH (ref 70–99)

## 2011-11-13 LAB — BASIC METABOLIC PANEL
CO2: 35 mEq/L — ABNORMAL HIGH (ref 19–32)
Chloride: 100 mEq/L (ref 96–112)
GFR calc Af Amer: >90 mL/min (ref >90)
Potassium: 3.9 mEq/L (ref 3.5–5.1)
Sodium: 142 mEq/L (ref 135–145)

## 2011-11-13 LAB — CBC
HCT: 36.7 % (ref 36.0–46.0)
Hemoglobin: 12.1 g/dL (ref 12.0–15.0)
MCV: 91.1 fL (ref 78.0–100.0)
RBC: 4.03 MIL/uL (ref 3.87–5.11)
RDW: 12.9 % (ref 11.5–15.5)
WBC: 7.1 10*3/uL (ref 4.0–10.5)

## 2011-11-13 MED ORDER — SERTRALINE HCL 50 MG PO TABS: 50.0000 mg | ORAL_TABLET | Freq: Every day | ORAL | Status: DC

## 2011-11-13 MED ORDER — ALBUTEROL SULFATE HFA 108 (90 BASE) MCG/ACT IN AERS
1.0000 | INHALATION_SPRAY | RESPIRATORY_TRACT | Status: DC | PRN
  Filled 2011-11-13: qty 6.7

## 2011-11-13 MED ORDER — LORAZEPAM 2 MG PO TABS: 1.0000 mg | ORAL_TABLET | Freq: Three times a day (TID) | ORAL | Status: DC

## 2011-11-13 MED ORDER — IPRATROPIUM-ALBUTEROL 18-103 MCG/ACT IN AERO: 2.0000 | INHALATION_SPRAY | Freq: Two times a day (BID) | RESPIRATORY_TRACT | Status: DC

## 2011-11-13 MED ORDER — IPRATROPIUM-ALBUTEROL 18-103 MCG/ACT IN AERO
2.0000 | INHALATION_SPRAY | Freq: Two times a day (BID) | RESPIRATORY_TRACT | Status: DC
  Administered 2011-11-13: 2 via RESPIRATORY_TRACT
  Filled 2011-11-13: qty 14.7

## 2011-11-13 NOTE — Consult Note (Signed)
Reason for Consult: bipolar d/o?   Maria Gaines is an 61 y.o. female.  HPI: This is a 61 year old female of [facility] who was directly admitted from clinic due to concerns regarding her confusion and lapses in memory for the past few months.  The patient reports starting to fall out of bed while sleeping that started 3 months ago. Since then, she reports having difficulties with her memory. She goes to do something and then forgets what she needs to do. She is also having difficulty remembering which medications she needs to take. during this interview also confirms that she is has difficulty remembering things that she usually did not have difficulty remembering.  Says she is depressed at times. Cries a lot since last few years (may be  6 years). Loss of multiple close family members including in her mother and brother. Mother died in 2004-02-19. Did not seek any help after the death of mother. Long hx of anxiety (free floating mostly). On Ativan for long time and says it helps with anxiety. Never seen psy in the past as out pt. Trouble with recent and long term memory but more with recent. Difficulties driving as sometimes she forgets directions and takes wrong turns and it is new for her. No dangerous behavior or other concern. Loss of memory is gradual. denies nay manic symptoms. Sees images of people at times and does not like it. hearing knocking sounds at times. NO SI, NO HI. Was never diagnosed with any mental illness in past.   Past Medical History  Diagnosis Date  . Visual field defect 03/09/11    Visual field defect right eye. Dr Cammy Copa optomotrist. plans rechck 6 m  . Hypertension   . Diabetes mellitus   . COPD (chronic obstructive pulmonary disease)   . Breast cancer 19-Feb-2004  . Vertigo   . Shortness of breath   . Asthma     per patient  . GERD (gastroesophageal reflux disease)     Past Surgical History  Procedure Date  . Tubal ligation     History reviewed. No pertinent family  history.  Social History:  reports that she quit smoking about 11 years ago. Her smoking use included Cigarettes. She has a 75 pack-year smoking history. She has never used smokeless tobacco. She reports that she does not drink alcohol or use illicit drugs.  Allergies:  Allergies  Allergen Reactions  . Aspirin     Per pt cannot take high dose ASA  . Codeine     "unclear of where i was at and who anyone was"  . Zolpidem Tartrate     REACTION: sleepwalking    Medications: I have reviewed the patient's current medications.  Results for orders placed during the hospital encounter of 11/12/11 (from the past 48 hour(s))  CBC     Status: Normal   Collection Time   11/12/11  1:41 PM      Component Value Range Comment   WBC 7.8  4.0 - 10.5 (K/uL)    RBC 4.24  3.87 - 5.11 (MIL/uL)    Hemoglobin 12.7  12.0 - 15.0 (g/dL)    HCT 40.9  81.1 - 91.4 (%)    MCV 90.6  78.0 - 100.0 (fL)    MCH 30.0  26.0 - 34.0 (pg)    MCHC 33.1  30.0 - 36.0 (g/dL)    RDW 78.2  95.6 - 21.3 (%)    Platelets 275  150 - 400 (K/uL)   COMPREHENSIVE METABOLIC PANEL  Status: Abnormal   Collection Time   11/12/11  1:41 PM      Component Value Range Comment   Sodium 136  135 - 145 (mEq/L)    Potassium 3.9  3.5 - 5.1 (mEq/L)    Chloride 94 (*) 96 - 112 (mEq/L)    CO2 34 (*) 19 - 32 (mEq/L)    Glucose, Bld 164 (*) 70 - 99 (mg/dL)    BUN 11  6 - 23 (mg/dL)    Creatinine, Ser 9.14  0.50 - 1.10 (mg/dL)    Calcium 9.6  8.4 - 10.5 (mg/dL)    Total Protein 7.4  6.0 - 8.3 (g/dL)    Albumin 3.9  3.5 - 5.2 (g/dL)    AST 12  0 - 37 (U/L)    ALT 10  0 - 35 (U/L)    Alkaline Phosphatase 87  39 - 117 (U/L)    Total Bilirubin 0.2 (*) 0.3 - 1.2 (mg/dL)    GFR calc non Af Amer >90  >90 (mL/min)    GFR calc Af Amer >90  >90 (mL/min)   DIFFERENTIAL     Status: Normal   Collection Time   11/12/11  1:41 PM      Component Value Range Comment   Neutrophils Relative 63  43 - 77 (%)    Neutro Abs 4.9  1.7 - 7.7 (K/uL)     Lymphocytes Relative 28  12 - 46 (%)    Lymphs Abs 2.1  0.7 - 4.0 (K/uL)    Monocytes Relative 7  3 - 12 (%)    Monocytes Absolute 0.5  0.1 - 1.0 (K/uL)    Eosinophils Relative 3  0 - 5 (%)    Eosinophils Absolute 0.2  0.0 - 0.7 (K/uL)    Basophils Relative 0  0 - 1 (%)    Basophils Absolute 0.0  0.0 - 0.1 (K/uL)   TSH     Status: Normal   Collection Time   11/12/11  1:41 PM      Component Value Range Comment   TSH 1.145  0.350 - 4.500 (uIU/mL)   VITAMIN B12     Status: Normal   Collection Time   11/12/11  5:23 PM      Component Value Range Comment   Vitamin B-12 261  211 - 911 (pg/mL)   URINALYSIS, ROUTINE W REFLEX MICROSCOPIC     Status: Abnormal   Collection Time   11/12/11  6:08 PM      Component Value Range Comment   Color, Urine YELLOW  YELLOW     APPearance CLEAR  CLEAR     Specific Gravity, Urine 1.014  1.005 - 1.030     pH 6.0  5.0 - 8.0     Glucose, UA 250 (*) NEGATIVE (mg/dL)    Hgb urine dipstick NEGATIVE  NEGATIVE     Bilirubin Urine NEGATIVE  NEGATIVE     Ketones, ur NEGATIVE  NEGATIVE (mg/dL)    Protein, ur NEGATIVE  NEGATIVE (mg/dL)    Urobilinogen, UA 1.0  0.0 - 1.0 (mg/dL)    Nitrite NEGATIVE  NEGATIVE     Leukocytes, UA NEGATIVE  NEGATIVE  MICROSCOPIC NOT DONE ON URINES WITH NEGATIVE PROTEIN, BLOOD, LEUKOCYTES, NITRITE, OR GLUCOSE <1000 mg/dL.  GLUCOSE, CAPILLARY     Status: Abnormal   Collection Time   11/12/11 10:44 PM      Component Value Range Comment   Glucose-Capillary 226 (*) 70 - 99 (mg/dL)  Comment 1 Notify RN       Dg Chest 2 View  11/12/2011  *RADIOLOGY REPORT*  Clinical Data: Cardiac blockage  CHEST - 2 VIEW  Comparison: 08/30/2011  Findings: Mild cardiomegaly.  Bronchitic changes.    Linear atelectasis at the left base.  No pneumothorax and no pleural effusion.  IMPRESSION: Left base linear atelectasis.  Original Report Authenticated By: Donavan Burnet, M.D.    Review of Systems  Gastrointestinal: Positive for nausea.   Blood pressure  127/80, pulse 74, temperature 97.4 F (36.3 C), resp. rate 20, SpO2 98.00%. Physical Exam    Appearance:  cooprative Behavior:  restless and fidgety  Speech:  normal Mood: ok Affect:  normal  Thought Process:  normal  Thought Content:  normal  Sensorium:  person, place, time/date and situation  Cognition: poor memory for short term and remote  Insight:  fair  Assessment/  Axis I: cognitve d/o nos, depressive d/o nos, anxiety d/o nos Axis II: deferred Axis III: see med hx Axis IV: unknown support group Axis V:  50   Plan:  1. She appeared to have depressive symptoms with possible psychotics symptoms. Depression can be cause of her cognitive symptoms   2. She needs dementia complete work up. It can be done at out pt. It also can be cause of her psychotic symptoms   3. Recommend out pt psy folllow up for both the above mentioned issues after discharge  4. Will recommend to inform family about her cognitive deficits to take all preventive measures for her safety at home  5. Her Ativan can be tapered as out pt. It can cognitive deficits. SSRI can be good option for her anxiety and depression  6. Can be started on zoloft 25 mg QD if pt is going to stay here for next couple of days otherwise it should be done as out pt  7. Can follow on 3/16 if needed  Wonda Cerise 11/13/2011, 1:32 AM

## 2011-11-13 NOTE — ED Provider Notes (Signed)
Medical screening examination/treatment/procedure(s) were performed by non-physician practitioner and as supervising physician I was immediately available for consultation/collaboration.  Cyndra Numbers, MD 11/13/11 1318

## 2011-11-13 NOTE — Discharge Summary (Signed)
I interviewed and examined this patient and discussed the care plan with Dr. Aviva Signs and the [facility] team and agree with assessment and plan as documented in the discharge note for today.    Audery Wassenaar A. Sheffield Slider, MD Family Medicine Teaching Service Attending  11/13/2011 9:40 PM

## 2011-11-13 NOTE — Discharge Summary (Signed)
Physician Discharge Summary  Patient ID: Maria Gaines [id number] 1950-09-07 61 y.o.  Admit date: 11/12/2011 Discharge date: 11/13/2011  PCP: [name], MD, MD   Discharge Diagnosis: 1. Memory loss. 2. Depression with anxiety features. 3. T2DM  4. HLD  5. CAD s/p stents  6. HTN  7. CHF  8. COPD 9. Hx of Breast cancer.    Discharge Medications  Cheresa, Siers  Home Medication Instructions GEX:[id number]   Printed on:11/13/11 1514  Medication Information                    potassium chloride (KLOR-CON 10) 10 MEQ CR tablet Take 10 mEq by mouth 2 (two) times daily.             clopidogrel (PLAVIX) 75 MG tablet Take 75 mg by mouth daily.             aspirin 81 MG tablet Take 81 mg by mouth daily.             Fluticasone-Salmeterol (ADVAIR DISKUS) 250-50 MCG/DOSE AEPB Inhale 2 puffs into the lungs 2 (two) times daily.            ipratropium-albuterol (DUONEB) 0.5-2.5 (3) MG/3ML SOLN 1 treatment every 12 hours SCHEDULED and every 2 hours as needed for coughing or wheezing / shortness of breath           benazepril (LOTENSIN) 5 MG tablet Take 1 tablet (5 mg total) by mouth daily.           carvedilol (COREG) 6.25 MG tablet Take 6.25 mg by mouth 2 (two) times daily with a meal.            diltiazem (CARTIA XT) 180 MG 24 hr capsule Take 1 capsule (180 mg total) by mouth daily.           LORazepam (ATIVAN) 2 MG tablet Take 1 tablet (2 mg total) by mouth 3 (three) times daily.           acetaminophen (TYLENOL) 500 MG tablet Take 1,000 mg by mouth every 6 (six) hours as needed. For pain           gabapentin (NEURONTIN) 300 MG capsule Take 300 mg by mouth 2 (two) times daily.            omeprazole (PRILOSEC) 40 MG capsule TAKE ONE CAPSULE BY MOUTH EVERY DAY           metFORMIN (GLUCOPHAGE) 1000 MG tablet TAKE 1 TABLET (1,000 MG TOTAL) BY MOUTH 2 (TWO) TIMES DAILY WITH A MEAL.           atorvastatin (LIPITOR) 80 MG tablet TAKE 1 TABLET (80 MG TOTAL) BY MOUTH  AT BEDTIME.           meclizine (ANTIVERT) 32 MG tablet Take 1 tablet (32 mg total) by mouth 3 (three) times daily as needed.           ipratropium (ATROVENT) 0.02 % nebulizer solution Take 500 mcg by nebulization 4 (four) times daily.           naphazoline-pheniramine (NAPHCON-A) 0.025-0.3 % ophthalmic solution Place 1 drop into both eyes 4 (four) times daily as needed. For dry eyes              Consults:Psychiatry   Labs: CBC  Lab 11/13/11 0700 11/12/11 1341 11/09/11 1428  WBC 7.1 7.8 7.0  HGB 12.1 12.7 12.2  HCT 36.7 38.4 39.8  PLT 262 275 270  BMET  Lab 11/13/11 0700 11/12/11 1341 11/09/11 1428  NA 142 136 136  K 3.9 3.9 4.0  CL 100 94* 93*  CO2 35* 34* 30  BUN 8 11 9   CREATININE 0.48* 0.62 0.62  CALCIUM 9.9 9.6 10.0  PROT -- 7.4 7.1  BILITOT -- 0.2* 0.3  ALKPHOS -- 87 79  ALT -- 10 10  AST -- 12 16  GLUCOSE 143* 164* 154*   Results for orders placed during the hospital encounter of 11/12/11 (from the past 72 hour(s))  TSH     Status: Normal   Collection Time   11/12/11  1:41 PM      Component Value Range Comment   TSH 1.145  0.350 - 4.500 (uIU/mL)   VITAMIN B12     Status: Normal   Collection Time   11/12/11  5:23 PM      Component Value Range Comment   Vitamin B-12 261  211 - 911 (pg/mL)   URINALYSIS, ROUTINE W REFLEX MICROSCOPIC     Status: Abnormal   Collection Time   11/12/11  6:08 PM      Component Value Range Comment   Color, Urine YELLOW  YELLOW     APPearance CLEAR  CLEAR     Specific Gravity, Urine 1.014  1.005 - 1.030     pH 6.0  5.0 - 8.0     Glucose, UA 250 (*) NEGATIVE (mg/dL)    Hgb urine dipstick NEGATIVE  NEGATIVE     Bilirubin Urine NEGATIVE  NEGATIVE     Ketones, ur NEGATIVE  NEGATIVE (mg/dL)    Protein, ur NEGATIVE  NEGATIVE (mg/dL)    Urobilinogen, UA 1.0  0.0 - 1.0 (mg/dL)    Nitrite NEGATIVE  NEGATIVE     Leukocytes, UA NEGATIVE  NEGATIVE      Procedures/Imaging:  Dg Chest 2 View 11/12/2011 IMPRESSION: Left base linear  atelectasis.  Original Report Authenticated By: Donavan Burnet, M.D.   Mr Brain Wo Contrast 11/13/2011 MPRESSION: No acute or reversible findings.  The patient refused contrast administration.  There is no finding to suggest metastatic disease on this noncontrast exam.  Moderate brain atrophy and chronic small vessel disease, not grossly progressive since last August.  Original Report Authenticated By: Thomasenia Sales, M.D.     Brief Hospital Course: Physical Exam for today: Gen: NAD, accompanied by husband  HEENT: PERRL, conjunctiva normal, ears with wax bilaterally but non-tender and -erythematous; no rhinorrhea; no neck LAD; no oropharyngeal exudates or erythema  Breast: well-healed scar left upper breast; mild nodularity consistent with fibrocystic breast disease without concerning masses  Pulm: audible wheezing; NI WOB at rest but moderate increased work-of-breathing with slow ambulation; no ronchi or rales  CV: RRR, distant heart sounds, no murmurs auscultated  Abd: NABS, soft, non-tender, obese  Ext: no edema; warm, dry; 2+ pedal pulses   Neuro: alert and oriented x 3. Normal reflexes. CN II to XII intact, no clonus bilaterally. Gait:  Leans  towards left-side with ambulation; Romberg: unsteady, leaning forwards and backwards. Negative pronator drift; sensation and strength  intact throughout   This is a 61 year old [place] female with a history of breast cancer s/p lumpectomy, chemotherapy, radiation and history of anxiety/?bipolar disorder/insomnia admitted for Confusion and decreased memory past few months and abnormal gait   MRI non suggestive of metastasic disease. Positive for moderated brain atrophy and chronic small vessel disease. Dr. Sheffield Slider performed a MMSE on which she scored 23, losing 1 pt for  year "3013", one from spelling backward, 2 from recall, 1 from following instructions, and one from drawing pentagons. She seemed distracted, but denied intrusive thoughts or worries.  She drew a clock well,but didn't put the hands at the time requested, substituting another times. She admits to depression and denies suicidal thoughts.  She scores in the mildly demented range,  Dr Martin Majestic plan will be to decrease her Lorazepam (half daily doses, but continue night time same) and start an SSRI. Because she says she reacted with abnormal movements to Paxil,  He recommended Sertraline 50 mg q AM. If it causes sedation, move it to bedtime. If she develops any mania symptoms, consider starting Quitiapine 25 mg at bedtime as a mood stabilizer.  After her psychiatric medications at optimized, Aricept 5 mg hs could be started. Her MMSE should be rechecked in 6-12 months to see if she is losing the 2-3 points annually seen in progressive dementias like Alzheimers. She does have cerebral and cerebellar atrophy which may account for her positive Romberg, though her gait for me wasn't wide-based or ataxic. Her vitamin B 12 is in the indeterminate range and methylmalonic acid test should be done to see if she needs Vitamin B12 replacement.    Rest of her medical conditions were stable during this  hospitalization.   Patient condition at time of discharge/disposition:  Patient is discharge home on stable medical condition.   Follow up issues: 1. Psychiatry outpatient f/u. 2. Methylmalonic acid test. 2. MMSE F/U in 6-12 months   Discharge follow up:  Discharge Orders    Future Appointments: Provider: Department: Dept Phone: Center:   12/02/2011 10:15 AM Nestor Ramp, MD [facility] [phone] [facility]   12/07/2011 1:30 PM Alvina Filbert [facility] [phone] None   12/07/2011 2:00 PM Lowella Dell, MD [facility] [phone] None      D. Piloto Rolene Arbour, MD  Patrcia D[facility] 11/13/2011

## 2011-11-13 NOTE — Progress Notes (Signed)
I interviewed and examined this patient and discussed the care plan with Dr. Aviva Signs and the [facility] team and agree with assessment and plan as documented in the progress note for today.   I performed a MMSE on which she scored  23, losing 1 pt for year "3013", one from spelling backward, 2 from recall, 1 from following instructions, and one from drawing pentagons. She seemed distracted, but denied intrusive thoughts or worries. She drew a clock well,but didn't put the hands at the time requested, substituting another times. She admits to depression and denies suicidal thoughts.   She scores in the mildly demented range, but I agree with Dr Theotis Barrio that she should decrease her Lorazepam (half daily doses, but continue night time same) and start an SSRI. Because she says she reacted with abnormal movements to Paxil, I recommend Sertraline 50 mg q AM. If it causes sedation, move it to bedtime. If she develops any mania symptoms, consider starting Quitiapine 25 mg at bedtime as a mood stabilizer. After her psychiatric medications at optimized, Aricept 5 mg hs could be started. Her MMSE should be rechecked in 6-12 months to see if she is losing the 2-3 points annually seen in progressive dementias like Alzheimers. She does have cerebral and cerebellar atrophy which may account for her positive Romberg, though her gait for me wasn't wide-based or ataxic. Her vitamin B 12 is in the indeterminate range and methylmalonic acid test should be done to see if she needs Vitamin B12 replacement.   Mischa Pollard A. Sheffield Slider, MD Family Medicine Teaching Service Attending  11/13/2011 2:16 PM

## 2011-11-13 NOTE — H&P (Signed)
[facility] Attending Admission Note: [name] MD [phone] pager office [phone] I  have seen and examined this patient, reviewed their chart. I have discussed this patient with the resident. I agree with the resident's findings, assessment and care plan. Please see my note for additional details.

## 2011-11-14 LAB — URINE CULTURE

## 2011-11-15 ENCOUNTER — Other Ambulatory Visit: Payer: Self-pay | Admitting: Family Medicine

## 2011-11-15 NOTE — Telephone Encounter (Signed)
Refill request

## 2011-11-16 ENCOUNTER — Encounter: Payer: Self-pay | Admitting: Family Medicine

## 2011-11-16 DIAGNOSIS — R413 Other amnesia: Secondary | ICD-10-CM

## 2011-11-20 ENCOUNTER — Telehealth: Payer: Self-pay | Admitting: Family Medicine

## 2011-11-20 NOTE — Telephone Encounter (Signed)
Patient is wanting Dr. [name] to call her urgently about her hips.

## 2011-11-20 NOTE — Telephone Encounter (Signed)
Called pt to find out about what was going on with her.  I spoke with Maria Gaines pt's daughter and she told me that she lives in [place] and she came up this weekend to assist pt with her medications she inquired about pt's Dx (mainley wanted to know about her mental dx)and I told her that I could not discuss this with her that her mother would have to tell her. Maria Gaines is placed on the phone and I told her about her Dx and she told her daughter.  I told her that if she would like for her daughter to have access to her medical information she will need to come in and sign a ROI giving permission to call and get information regarding this. She understood and agreed. Her daughter also wanted to know if Dr. [name] would make a referral to get her mom some help with regards to her taking her meds properly because she really concerned with her not taking her medications like she is supposed to and thinks that this is what is causing the mental changes that she is having.  I told her that I would forward this information to Dr. [name] for advice.[name]

## 2011-11-23 NOTE — Telephone Encounter (Signed)
Dear [name] Can we get  home health set up to go and do a med pill box for her and train her husband to fill it? THANKS! [name]

## 2011-11-24 ENCOUNTER — Other Ambulatory Visit: Payer: Self-pay | Admitting: Family Medicine

## 2011-11-24 ENCOUNTER — Encounter: Payer: Self-pay | Admitting: Internal Medicine

## 2011-11-24 ENCOUNTER — Telehealth: Payer: Self-pay | Admitting: Family Medicine

## 2011-11-24 DIAGNOSIS — R410 Disorientation, unspecified: Secondary | ICD-10-CM

## 2011-11-24 NOTE — Telephone Encounter (Signed)
Needs to speak with [name] about an order that that was sent to her - wants to know if we can add orders for a nurse to assess - the orders are only for an aide and they don't have aides.

## 2011-11-24 NOTE — Telephone Encounter (Signed)
Faxed referral form and OV notes to [facility] [phone]

## 2011-11-25 NOTE — Telephone Encounter (Signed)
Gave verbal orders to [place] at [facility].

## 2011-12-02 ENCOUNTER — Ambulatory Visit: Payer: Medicare Other | Admitting: Family Medicine

## 2011-12-02 ENCOUNTER — Other Ambulatory Visit: Payer: Self-pay

## 2011-12-02 ENCOUNTER — Emergency Department (HOSPITAL_COMMUNITY)
Admission: EM | Admit: 2011-12-02 | Discharge: 2011-12-02 | Disposition: A | Discharge status: Home / Self Care | Payer: Medicare Other | Attending: Emergency Medicine | Admitting: Emergency Medicine

## 2011-12-02 ENCOUNTER — Encounter (HOSPITAL_COMMUNITY): Payer: Self-pay

## 2011-12-02 ENCOUNTER — Emergency Department (HOSPITAL_COMMUNITY): Payer: Medicare Other

## 2011-12-02 DIAGNOSIS — R059 Cough, unspecified: Secondary | ICD-10-CM | POA: Insufficient documentation

## 2011-12-02 DIAGNOSIS — R05 Cough: Secondary | ICD-10-CM | POA: Insufficient documentation

## 2011-12-02 DIAGNOSIS — I509 Heart failure, unspecified: Secondary | ICD-10-CM

## 2011-12-02 DIAGNOSIS — I10 Essential (primary) hypertension: Secondary | ICD-10-CM | POA: Insufficient documentation

## 2011-12-02 DIAGNOSIS — E119 Type 2 diabetes mellitus without complications: Secondary | ICD-10-CM | POA: Insufficient documentation

## 2011-12-02 DIAGNOSIS — Z9981 Dependence on supplemental oxygen: Secondary | ICD-10-CM | POA: Insufficient documentation

## 2011-12-02 DIAGNOSIS — R0602 Shortness of breath: Secondary | ICD-10-CM | POA: Insufficient documentation

## 2011-12-02 DIAGNOSIS — J4489 Other specified chronic obstructive pulmonary disease: Secondary | ICD-10-CM | POA: Insufficient documentation

## 2011-12-02 DIAGNOSIS — J449 Chronic obstructive pulmonary disease, unspecified: Secondary | ICD-10-CM | POA: Insufficient documentation

## 2011-12-02 LAB — DIFFERENTIAL
Basophils Absolute: 0 10*3/uL (ref 0.0–0.1)
Basophils Relative: 0 % (ref 0–1)
Lymphocytes Relative: 26 % (ref 12–46)
Neutro Abs: 6.7 10*3/uL (ref 1.7–7.7)
Neutrophils Relative %: 65 % (ref 43–77)

## 2011-12-02 LAB — POCT I-STAT 3, ART BLOOD GAS (G3+)
O2 Saturation: 99 %
pCO2 arterial: 43.7 mmHg (ref 35.0–45.0)
pH, Arterial: 7.514 — ABNORMAL HIGH (ref 7.350–7.400)

## 2011-12-02 LAB — CBC
MCHC: 33.2 g/dL (ref 30.0–36.0)
RDW: 12.2 % (ref 11.5–15.5)
WBC: 10.2 10*3/uL (ref 4.0–10.5)

## 2011-12-02 LAB — BASIC METABOLIC PANEL
Chloride: 90 mEq/L — ABNORMAL LOW (ref 96–112)
GFR calc Af Amer: >90 mL/min (ref >90)
Potassium: 4.6 mEq/L (ref 3.5–5.1)
Sodium: 131 mEq/L — ABNORMAL LOW (ref 135–145)

## 2011-12-02 LAB — PRO B NATRIURETIC PEPTIDE: Pro B Natriuretic peptide (BNP): 122.6 pg/mL (ref 0–125)

## 2011-12-02 LAB — TROPONIN I: Troponin I: <0.3 ng/mL (ref <0.30)

## 2011-12-02 MED ORDER — ALBUTEROL SULFATE (5 MG/ML) 0.5% IN NEBU
2.5000 mg | INHALATION_SOLUTION | RESPIRATORY_TRACT | Status: DC
  Administered 2011-12-02: 2.5 mg via RESPIRATORY_TRACT
  Filled 2011-12-02: qty 0.5

## 2011-12-02 MED ORDER — METHYLPREDNISOLONE SODIUM SUCC 125 MG IJ SOLR
125.0000 mg | Freq: Once | INTRAMUSCULAR | Status: AC
  Administered 2011-12-02: 125 mg via INTRAVENOUS
  Filled 2011-12-02: qty 2

## 2011-12-02 MED ORDER — PREDNISONE 20 MG PO TABS
60.0000 mg | ORAL_TABLET | Freq: Once | ORAL | Status: AC
  Administered 2011-12-02: 60 mg via ORAL
  Filled 2011-12-02: qty 3

## 2011-12-02 MED ORDER — IPRATROPIUM BROMIDE 0.02 % IN SOLN
0.5000 mg | RESPIRATORY_TRACT | Status: DC
  Administered 2011-12-02: 0.5 mg via RESPIRATORY_TRACT
  Filled 2011-12-02: qty 2.5

## 2011-12-02 MED ORDER — PREDNISONE 50 MG PO TABS: ORAL_TABLET | ORAL | Status: DC

## 2011-12-02 NOTE — ED Provider Notes (Signed)
History     CSN: [id number]  Arrival date & time 12/02/11  1731   First MD Initiated Contact with Patient 12/02/11 1932      Chief Complaint  Patient presents with  . Shortness of Breath    (Consider location/radiation/quality/duration/timing/severity/associated sxs/prior treatment) HPI  Patient presents to emergency department complaining of shortness of breath. Patient states she wears 2 L of oxygen at home on regular basis for COPD and congestive heart failure. Patient is a poor historian stating at first that she feels that she had increasing shortness of breath over 2-3 weeks but then states "actually I am always short of breath and I am just tired of being short of breath" denying any remarkable increase in shortness of breath recently. Patient states she has chronic shortness of breath and cough but denies any known fevers. Patient states "I might have felt a little hot last week" but no recorded fever. Patient states she uses daily inhalers and home oxygen without any improvement in her shortness of breath. Patient states shortness of breath is exertional. She denies any associated fever, chills, productive cough, hemoptysis, chest pain, abdominal pain, nausea, vomiting, diarrhea. Patient states she lives at home with her husband. Patient states that she has history of orthopnea but that this is greatly improved and now she is able to lie flat at night to sleep without any shortness of breath greater than baseline shortness of breath and denies PND. Patient is on daily Lasix but has not required any increase in Lasix denying any lower extremity swelling. Patient denies any current steroid use and states "I haven't been on steroids in a long time."   Past Medical History  Diagnosis Date  . Visual field defect 03/09/11    Visual field defect right eye. Dr Cammy Copa optomotrist. plans rechck 6 m  . Hypertension   . Diabetes mellitus   . COPD (chronic obstructive pulmonary disease)   . Breast  cancer 2005  . Vertigo   . Shortness of breath   . Asthma     per patient  . GERD (gastroesophageal reflux disease)     Past Surgical History  Procedure Date  . Tubal ligation     No family history on file.  History  Substance Use Topics  . Smoking status: Former Smoker -- 1.5 packs/day for 50 years    Types: Cigarettes    Quit date: 08/31/2000  . Smokeless tobacco: Never Used  . Alcohol Use: No    OB History    Grav Para Term Preterm Abortions TAB SAB Ect Mult Living                  Review of Systems  All other systems reviewed and are negative.    Allergies  Codeine and Zolpidem tartrate  Home Medications   Current Outpatient Rx  Name Route Sig Dispense Refill  . ACETAMINOPHEN 500 MG PO TABS Oral Take 1,000 mg by mouth every 6 (six) hours as needed. For pain    . ASPIRIN 81 MG PO TABS Oral Take 81 mg by mouth daily.      . ATORVASTATIN CALCIUM 80 MG PO TABS  TAKE 1 TABLET (80 MG TOTAL) BY MOUTH AT BEDTIME. 30 tablet 9    REQUESTING #90  . BENAZEPRIL HCL 5 MG PO TABS Oral Take 1 tablet (5 mg total) by mouth daily. 30 tablet 11  . CARVEDILOL 6.25 MG PO TABS Oral Take 6.25 mg by mouth 2 (two) times daily with a  meal.     . CLOPIDOGREL BISULFATE 75 MG PO TABS Oral Take 75 mg by mouth daily.      Marland Kitchen DILTIAZEM HCL ER COATED BEADS 180 MG PO CP24 Oral Take 1 capsule (180 mg total) by mouth daily. 90 capsule 3  . FLUTICASONE-SALMETEROL 250-50 MCG/DOSE IN AEPB Inhalation Inhale 2 puffs into the lungs 2 (two) times daily.     Marland Kitchen GABAPENTIN 300 MG PO CAPS Oral Take 300 mg by mouth 2 (two) times daily.     . IPRATROPIUM-ALBUTEROL 0.5-2.5 (3) MG/3ML IN SOLN  1 treatment every 12 hours SCHEDULED and every 2 hours as needed for coughing or wheezing / shortness of breath 360 mL 11  . LORAZEPAM 2 MG PO TABS Oral Take 0.5 tablets (1 mg total) by mouth 3 (three) times daily. 90 tablet 5    1 mg tab twice a day and 2 mg before bed.  . MECLIZINE HCL 25 MG PO TABS  TAKE 1 TABLET  THREE TIMES A DAY 30 tablet 0  . METFORMIN HCL 1000 MG PO TABS  TAKE 1 TABLET (1,000 MG TOTAL) BY MOUTH 2 (TWO) TIMES DAILY WITH A MEAL. 60 tablet 4    REQUESTING #180  . OMEPRAZOLE 40 MG PO CPDR  TAKE ONE CAPSULE BY MOUTH EVERY DAY 90 capsule 3  . POTASSIUM CHLORIDE 10 MEQ PO TBCR Oral Take 10 mEq by mouth 2 (two) times daily.        BP 132/70  Pulse 88  Temp(Src) 97.9 F (36.6 C) (Oral)  Resp 22  Ht 5\' 2"  (1.575 m)  Wt 150 lb (68.04 kg)  BMI 27.44 kg/m2  SpO2 89%  Physical Exam  Nursing note and vitals reviewed. Constitutional: She is oriented to person, place, and time. She appears well-developed and well-nourished. No distress.  HENT:  Head: Normocephalic and atraumatic.  Eyes: Conjunctivae are normal.  Neck: Normal range of motion. Neck supple.  Cardiovascular: Normal rate, regular rhythm, normal heart sounds and intact distal pulses.  Exam reveals no gallop and no friction rub.   No murmur heard. Pulmonary/Chest: Effort normal. No respiratory distress. She has wheezes. She has rales. She exhibits no tenderness.  Abdominal: Soft. Bowel sounds are normal. She exhibits no distension and no mass. There is no tenderness. There is no rebound and no guarding.  Musculoskeletal: Normal range of motion. She exhibits no edema and no tenderness.  Neurological: She is alert and oriented to person, place, and time.  Skin: Skin is warm and dry. No rash noted. She is not diaphoretic. No erythema.  Psychiatric: She has a normal mood and affect.    ED Course  Procedures (including critical care time)  duoneb in process. IV solumedrol given in triage.    Date: 12/02/2011  Rate: 73  Rhythm: normal sinus rhythm  QRS Axis: normal  Intervals: normal  ST/T Wave abnormalities: normal  Conduction Disutrbances: none  Narrative Interpretation: non provocative compared to November 13, 2011  Old EKG Reviewed: No significant changes noted     Labs Reviewed  BASIC METABOLIC PANEL - Abnormal;  Notable for the following:    Sodium 131 (*)    Chloride 90 (*)    CO2 34 (*)    Glucose, Bld 171 (*)    All other components within normal limits  POCT I-STAT 3, BLOOD GAS (G3+) - Abnormal; Notable for the following:    pH, Arterial 7.514 (*)    pO2, Arterial 135.0 (*)    Bicarbonate 35.3 (*)  Acid-Base Excess 11.0 (*)    All other components within normal limits  CBC  DIFFERENTIAL  PRO B NATRIURETIC PEPTIDE  TROPONIN I   Dg Chest 2 View  12/02/2011  *RADIOLOGY REPORT*  Clinical Data: Shortness of breath.  Asthma attack.  CHEST - 2 VIEW  Comparison: 11/11/2011.  Findings: The The cardiac silhouette, mediastinal and hilar contours are within normal limits and stable. The lungs are clear. No pleural effusions.  The bony thorax is intact.  Stable surgical changes from left breast surgery.  IMPRESSION: No acute cardiopulmonary findings.  Original Report Authenticated By: P. Loralie Champagne, M.D.   9:45 PM Patient is ambulating about the department on 4 liters of oxygen without complaint denying chest pain, shortness of breath or dizziness with her oxygenation sats remaining above 97%.  Case discussed with Dr. Rubin Payor. Treatment plan formulated with Dr. Rubin Payor.  1. Shortness of breath   2. COPD (chronic obstructive pulmonary disease)   3. CHF (congestive heart failure)       MDM  Patient is afebrile and nontoxic-appearing. Patient is reporting a long-standing history of shortness of breath despite her 2 L of oxygen at home with underlying congestive heart failure and COPD. Evaluation history do not reveal any recent or acute changes of symptoms or acute findings on tests or imaging. Patient is able to ambulate on 4 L of oxygen without symptoms keeping her oxygenation above 97%. At this time think it is reasonable to send patient with a dose pack of steroids and close followup with her primary care provider in the next few days for further evaluation and management of what appears to  be chronic disease with shortness of breath. Will instruct patient to increase her oxygen to 3 L at rest and 4 L with exertion. Patient is agreeable to this plan. Patient denies any associated chest pain with shortness of breath. No other acute findings seen. Patient does not show congestive heart failure on chest x-ray and does not appear to be fluid overload.        [name], [place] 12/02/11 2150

## 2011-12-02 NOTE — ED Notes (Signed)
Assumed care of pt.  Pt reports feeling much better than she did when she got here.  States that she smoked for 50 years and that today she just felt more SOB than usual.  Pt on 2L home O2.  Currently on 2L [place] and reports feeling good.  Pt noted to have expiratory wheezing after treatment.  Vitals stable.

## 2011-12-02 NOTE — ED Notes (Signed)
Pt ambulated with no problems on 4L of O2. Did good O2 stayed between 98 and 97

## 2011-12-02 NOTE — Discharge Instructions (Signed)
Take prednisone as directed for a total of 5 days. Increase your home oxygen to 3 L while you're at rest and to 4 L when you're up moving. Call your primary care doctor's office in the morning to schedule close followup for further evaluation and management of your COPD and congestive heart failure as well as your daily oxygen use however return to emergency department for any changing or worsening of symptoms.  Dyspnea Shortness of breath (dyspnea) is the feeling of uneasy breathing. Dyspnea should be evaluated promptly. DIAGNOSIS  Many tests may be done to find why you are having shortness of breath. Tests may include:  A chest X-ray.   A lung function test.   Blood tests.   Recordings of the electrical activity of the heart (electrocardiogram).   Exercise testing.   Sound wave images of the heart (a cardiac echocardiogram).   A scan.  A cause for your shortness of breath may not be identified initially. In this case, it is important to have a follow-up exam with your caregiver. HOME CARE INSTRUCTIONS   Do not smoke. Smoking is a common cause of shortness of breath. Ask for help to stop smoking.   Avoid being around chemicals that may bother your breathing, such as paint fumes or dust.   Rest as needed. Slowly begin your usual activities.   If medications were prescribed, take them as directed for the full length of time directed. This includes oxygen and any inhaled medications, if prescribed.   It is very important that you follow up with your caregiver or other physician as directed. Waiting to do so or failure to follow up could result in worsening of your condition, possible disability, or death.   Be sure you understand what to do or who to call if your shortness of breath worsens.  SEEK MEDICAL CARE IF:   Your condition does not improve in the time expected.   You have a hard time doing your normal activities even with rest.   You have any side effects from or  problems with medications prescribed.  SEEK IMMEDIATE MEDICAL CARE IF:   You feel your shortness of breath is getting worse.   You feel lightheaded, faint or develop a cough not controlled with medications.   You start coughing up blood.   You get pain with breathing.   You get chest pain or pain in your arms, shoulders or belly (abdomen).   You have a fever.   You are unable to walk up stairs or exercise the way you normally can.  MAKE SURE YOU:   Understand these instructions.   Will watch your condition.   Will get help right away if you are not doing well or get worse.  Document Released: 09/24/2004 Document Revised: 04/29/2011 Document Reviewed: 01/02/2010 [facility] Patient Information 2012 [place], [place].  Chronic Obstructive Pulmonary Disease Chronic obstructive pulmonary disease (COPD) is a condition in which airflow from the lungs is restricted. The lungs can never return to normal, but there are measures you can take which will improve them and make you feel better. CAUSES   Smoking.   Exposure to secondhand smoke.   Breathing in irritants (pollution, cigarette smoke, strong smells, aerosol sprays, paint fumes).   History of lung infections.  TREATMENT  Treatment focuses on making you comfortable (supportive care). Your caregiver may prescribe medications (inhaled or pills) to help improve your breathing. HOME CARE INSTRUCTIONS   If you smoke, stop smoking.   Avoid exposure to smoke,  chemicals, and fumes that aggravate your breathing.   Take antibiotic medicines as directed by your caregiver.   Avoid medicines that dry up your system and slow down the elimination of secretions (antihistamines and cough syrups). This decreases respiratory capacity and may lead to infections.   Drink enough water and fluids to keep your urine clear or pale yellow. This loosens secretions.   Use humidifiers at home and at your bedside if they do not make breathing difficult.     Receive all protective vaccines your caregiver suggests, especially pneumococcal and influenza.   Use home oxygen as suggested.   Stay active. Exercise and physical activity will help maintain your ability to do things you want to do.   Eat a healthy diet.  SEEK MEDICAL CARE IF:   You develop pus-like mucus (sputum).   Breathing is more labored or exercise becomes difficult to do.   You are running out of the medicine you take for your breathing.  SEEK IMMEDIATE MEDICAL CARE IF:   You have a rapid heart rate.   You have agitation, confusion, tremors, or are in a stupor (family members may need to observe this).   It becomes difficult to breathe.   You develop chest pain.   You have a fever.  MAKE SURE YOU:   Understand these instructions.   Will watch your condition.   Will get help right away if you are not doing well or get worse.  Document Released: 05/27/2005 Document Revised: 08/06/2011 Document Reviewed: 10/17/2010 [facility] Patient Information 2012 [place], [place].

## 2011-12-02 NOTE — ED Provider Notes (Signed)
BP 132/70  Pulse 88  Temp(Src) 97.9 F (36.6 C) (Oral)  Resp 22  Ht 5\' 2"  (1.575 m)  Wt 150 lb (68.04 kg)  BMI 27.44 kg/m2  SpO2 89%  Medical screening exam performed by me  Poor historian H/o CHF, COPD on 2L home oxygen, Breast ca s/p lumpectomy/chemo/radiation in remission pw worsening SOB x 2 weeks. No orthopnea/PND. Worsening DOE. +Cough/wheezing. +Subj fever. Hypoxic on 2L home oxygen on arrival. Sleepier than usual "because of my medicines".  Moderate air movement. Min exp wheeze. 89%2L home oxygen.  Duoneb, solumedrol, EKG, istat abg, labs ordered. Move to main ED for further evaluation and w/u.  Forbes Cellar, MD 12/02/11 [phone]

## 2011-12-02 NOTE — ED Notes (Signed)
Patient presents with shortness of breath x 2 weeks, especially upon exertion.  Patient denies chest pain, or recent illness.  Patient is on 2 liters of oxygen at home consistently.

## 2011-12-02 NOTE — ED Notes (Signed)
Switched pt off her own personal tank to a hospital tank

## 2011-12-03 ENCOUNTER — Telehealth: Payer: Self-pay | Admitting: Family Medicine

## 2011-12-03 NOTE — Telephone Encounter (Signed)
Returned call to patient.  States she only wants to speak with Dr. [name].  Informed patient that Dr. [name] not in office this afternoon, but I can send her a message to review for tomorrow.  Patient is agreeable to this.  Patient states she is "very sick---something tragically wrong is going on."  Is having problems with memory and confusion.  Dr. Donnetta Hail schedule is full through 01/23/12 per Epic.   Will route note to Dr. [name].  Senaida Ores, Maryjean Ka, RN

## 2011-12-03 NOTE — Telephone Encounter (Signed)
Maria Gaines really wants to speak to Dr. [name] because she is feeling very sick.  She feels like she is very confused and feels like she is losing her mind.  She was in the ER last night for this.

## 2011-12-03 NOTE — ED Provider Notes (Signed)
Medical screening examination/treatment/procedure(s) were performed by non-physician practitioner and as supervising physician I was immediately available for consultation/collaboration.  Juliet Rude. Rubin Payor, MD 12/03/11 [phone]

## 2011-12-04 NOTE — Telephone Encounter (Signed)
Patient scheduled off schedule 11am Monday.

## 2011-12-04 NOTE — Telephone Encounter (Signed)
Dear [name] Team I can see her off schedcule Monday at 11 am if she wants (she no showed me Wed of this week). If she wants to bring a family member withher to the appt---that might be helpful. If she does want this appt---let e know so I can put it on my schedule THANKS! [name] Ps  I reviewed ED visit and it said she was there for SOB.

## 2011-12-05 ENCOUNTER — Inpatient Hospital Stay (HOSPITAL_COMMUNITY)
Admission: EM | Admit: 2011-12-05 | Discharge: 2011-12-09 | DRG: 884 | Disposition: A | Discharge status: Home / Self Care | Payer: Medicare Other | Attending: Family Medicine | Admitting: Family Medicine

## 2011-12-05 ENCOUNTER — Emergency Department (HOSPITAL_COMMUNITY): Payer: Medicare Other

## 2011-12-05 ENCOUNTER — Other Ambulatory Visit: Payer: Self-pay

## 2011-12-05 ENCOUNTER — Encounter (HOSPITAL_COMMUNITY): Payer: Self-pay

## 2011-12-05 DIAGNOSIS — G4700 Insomnia, unspecified: Secondary | ICD-10-CM

## 2011-12-05 DIAGNOSIS — Z7982 Long term (current) use of aspirin: Secondary | ICD-10-CM

## 2011-12-05 DIAGNOSIS — R410 Disorientation, unspecified: Secondary | ICD-10-CM | POA: Diagnosis present

## 2011-12-05 DIAGNOSIS — J449 Chronic obstructive pulmonary disease, unspecified: Secondary | ICD-10-CM | POA: Diagnosis present

## 2011-12-05 DIAGNOSIS — Z9861 Coronary angioplasty status: Secondary | ICD-10-CM

## 2011-12-05 DIAGNOSIS — Z794 Long term (current) use of insulin: Secondary | ICD-10-CM

## 2011-12-05 DIAGNOSIS — Z888 Allergy status to other drugs, medicaments and biological substances status: Secondary | ICD-10-CM

## 2011-12-05 DIAGNOSIS — Z853 Personal history of malignant neoplasm of breast: Secondary | ICD-10-CM

## 2011-12-05 DIAGNOSIS — F319 Bipolar disorder, unspecified: Secondary | ICD-10-CM | POA: Diagnosis present

## 2011-12-05 DIAGNOSIS — K219 Gastro-esophageal reflux disease without esophagitis: Secondary | ICD-10-CM | POA: Diagnosis present

## 2011-12-05 DIAGNOSIS — R413 Other amnesia: Secondary | ICD-10-CM

## 2011-12-05 DIAGNOSIS — J441 Chronic obstructive pulmonary disease with (acute) exacerbation: Secondary | ICD-10-CM | POA: Diagnosis present

## 2011-12-05 DIAGNOSIS — I2510 Atherosclerotic heart disease of native coronary artery without angina pectoris: Secondary | ICD-10-CM | POA: Diagnosis present

## 2011-12-05 DIAGNOSIS — Z79899 Other long term (current) drug therapy: Secondary | ICD-10-CM

## 2011-12-05 DIAGNOSIS — F0390 Unspecified dementia without behavioral disturbance: Principal | ICD-10-CM | POA: Diagnosis present

## 2011-12-05 DIAGNOSIS — E119 Type 2 diabetes mellitus without complications: Secondary | ICD-10-CM | POA: Diagnosis present

## 2011-12-05 DIAGNOSIS — Z955 Presence of coronary angioplasty implant and graft: Secondary | ICD-10-CM

## 2011-12-05 DIAGNOSIS — J4489 Other specified chronic obstructive pulmonary disease: Secondary | ICD-10-CM | POA: Diagnosis present

## 2011-12-05 DIAGNOSIS — F22 Delusional disorders: Secondary | ICD-10-CM | POA: Diagnosis present

## 2011-12-05 DIAGNOSIS — Z87891 Personal history of nicotine dependence: Secondary | ICD-10-CM

## 2011-12-05 DIAGNOSIS — Z7902 Long term (current) use of antithrombotics/antiplatelets: Secondary | ICD-10-CM

## 2011-12-05 DIAGNOSIS — I10 Essential (primary) hypertension: Secondary | ICD-10-CM | POA: Diagnosis present

## 2011-12-05 DIAGNOSIS — E860 Dehydration: Secondary | ICD-10-CM | POA: Diagnosis present

## 2011-12-05 LAB — COMPREHENSIVE METABOLIC PANEL
AST: 12 U/L (ref 0–37)
Albumin: 3.7 g/dL (ref 3.5–5.2)
Chloride: 90 mEq/L — ABNORMAL LOW (ref 96–112)
Creatinine, Ser: 0.46 mg/dL — ABNORMAL LOW (ref 0.50–1.10)
Total Bilirubin: 0.3 mg/dL (ref 0.3–1.2)
Total Protein: 7 g/dL (ref 6.0–8.3)

## 2011-12-05 LAB — BLOOD GAS, ARTERIAL
Acid-Base Excess: 9.2 mmol/L — ABNORMAL HIGH (ref 0.0–2.0)
Drawn by: 10370
O2 Content: 2 L/min
pCO2 arterial: 67 mmHg (ref 35.0–45.0)
pO2, Arterial: 70.6 mmHg — ABNORMAL LOW (ref 80.0–100.0)

## 2011-12-05 LAB — DIFFERENTIAL
Basophils Absolute: 0 10*3/uL (ref 0.0–0.1)
Basophils Relative: 0 % (ref 0–1)
Eosinophils Absolute: 0.1 10*3/uL (ref 0.0–0.7)
Monocytes Absolute: 0.8 10*3/uL (ref 0.1–1.0)
Neutro Abs: 7.3 10*3/uL (ref 1.7–7.7)
Neutrophils Relative %: 71 % (ref 43–77)

## 2011-12-05 LAB — URINALYSIS, ROUTINE W REFLEX MICROSCOPIC
Leukocytes, UA: NEGATIVE
Nitrite: NEGATIVE
Specific Gravity, Urine: 1.027 (ref 1.005–1.030)
Urobilinogen, UA: 1 mg/dL (ref 0.0–1.0)
pH: 7.5 (ref 5.0–8.0)

## 2011-12-05 LAB — GLUCOSE, CAPILLARY: Glucose-Capillary: 212 mg/dL — ABNORMAL HIGH (ref 70–99)

## 2011-12-05 LAB — RAPID URINE DRUG SCREEN, HOSP PERFORMED
Barbiturates: NOT DETECTED
Benzodiazepines: NOT DETECTED
Cocaine: NOT DETECTED

## 2011-12-05 LAB — URINE MICROSCOPIC-ADD ON

## 2011-12-05 LAB — CBC
HCT: 39.5 % (ref 36.0–46.0)
Hemoglobin: 11.9 g/dL — ABNORMAL LOW (ref 12.0–15.0)
MCH: 29.5 pg (ref 26.0–34.0)
MCHC: 31.9 g/dL (ref 30.0–36.0)
MCV: 89.9 fL (ref 78.0–100.0)
RBC: 4.04 MIL/uL (ref 3.87–5.11)
RDW: 12.6 % (ref 11.5–15.5)

## 2011-12-05 LAB — POCT I-STAT TROPONIN I

## 2011-12-05 LAB — PROTIME-INR: INR: 0.93 (ref 0.00–1.49)

## 2011-12-05 LAB — AMMONIA: Ammonia: 27 umol/L (ref 11–60)

## 2011-12-05 MED ORDER — FLUTICASONE-SALMETEROL 250-50 MCG/DOSE IN AEPB
2.0000 | INHALATION_SPRAY | Freq: Two times a day (BID) | RESPIRATORY_TRACT | Status: DC
  Administered 2011-12-06 – 2011-12-09 (×7): 2 via RESPIRATORY_TRACT
  Filled 2011-12-05 (×3): qty 14

## 2011-12-05 MED ORDER — ENOXAPARIN SODIUM 40 MG/0.4ML ~~LOC~~ SOLN
40.0000 mg | Freq: Every day | SUBCUTANEOUS | Status: DC
  Administered 2011-12-06 – 2011-12-08 (×4): 40 mg via SUBCUTANEOUS
  Filled 2011-12-05 (×5): qty 0.4

## 2011-12-05 MED ORDER — CLOPIDOGREL BISULFATE 75 MG PO TABS
75.0000 mg | ORAL_TABLET | Freq: Every day | ORAL | Status: DC
  Administered 2011-12-06 – 2011-12-09 (×4): 75 mg via ORAL
  Filled 2011-12-05 (×5): qty 1

## 2011-12-05 MED ORDER — PREDNISONE 50 MG PO TABS
50.0000 mg | ORAL_TABLET | Freq: Every day | ORAL | Status: DC
  Administered 2011-12-06 – 2011-12-09 (×4): 50 mg via ORAL
  Filled 2011-12-05 (×6): qty 1

## 2011-12-05 MED ORDER — ACETAMINOPHEN 325 MG PO TABS
650.0000 mg | ORAL_TABLET | Freq: Four times a day (QID) | ORAL | Status: DC | PRN
  Administered 2011-12-08: 650 mg via ORAL
  Filled 2011-12-05 (×2): qty 2

## 2011-12-05 MED ORDER — DILTIAZEM HCL ER COATED BEADS 180 MG PO CP24
180.0000 mg | ORAL_CAPSULE | Freq: Every day | ORAL | Status: DC
  Administered 2011-12-06 – 2011-12-09 (×4): 180 mg via ORAL
  Filled 2011-12-05 (×4): qty 1

## 2011-12-05 MED ORDER — LORAZEPAM 1 MG PO TABS
1.0000 mg | ORAL_TABLET | Freq: Three times a day (TID) | ORAL | Status: DC
  Administered 2011-12-05 – 2011-12-09 (×10): 1 mg via ORAL
  Filled 2011-12-05 (×10): qty 1

## 2011-12-05 MED ORDER — BENAZEPRIL HCL 5 MG PO TABS
5.0000 mg | ORAL_TABLET | Freq: Every day | ORAL | Status: DC
  Administered 2011-12-05 – 2011-12-09 (×5): 5 mg via ORAL
  Filled 2011-12-05 (×5): qty 1

## 2011-12-05 MED ORDER — ASPIRIN 81 MG PO TABS: 81.0000 mg | ORAL_TABLET | Freq: Every day | ORAL | Status: DC

## 2011-12-05 MED ORDER — IPRATROPIUM BROMIDE 0.02 % IN SOLN
0.5000 mg | RESPIRATORY_TRACT | Status: DC | PRN
  Filled 2011-12-05: qty 2.5

## 2011-12-05 MED ORDER — METFORMIN HCL 500 MG PO TABS
1000.0000 mg | ORAL_TABLET | Freq: Two times a day (BID) | ORAL | Status: DC
  Administered 2011-12-06 – 2011-12-09 (×7): 1000 mg via ORAL
  Filled 2011-12-05 (×10): qty 2

## 2011-12-05 MED ORDER — SODIUM CHLORIDE 0.9 % IV SOLN
INTRAVENOUS | Status: DC
  Administered 2011-12-05: 18:00:00 via INTRAVENOUS

## 2011-12-05 MED ORDER — ASPIRIN EC 81 MG PO TBEC
81.0000 mg | DELAYED_RELEASE_TABLET | Freq: Every day | ORAL | Status: DC
  Administered 2011-12-06 – 2011-12-09 (×4): 81 mg via ORAL
  Filled 2011-12-05 (×4): qty 1

## 2011-12-05 MED ORDER — ALBUTEROL SULFATE (5 MG/ML) 0.5% IN NEBU
INHALATION_SOLUTION | RESPIRATORY_TRACT | Status: AC
  Filled 2011-12-05: qty 0.5

## 2011-12-05 MED ORDER — ALBUTEROL SULFATE (5 MG/ML) 0.5% IN NEBU
2.5000 mg | INHALATION_SOLUTION | RESPIRATORY_TRACT | Status: DC | PRN
  Administered 2011-12-05: 2.5 mg via RESPIRATORY_TRACT

## 2011-12-05 MED ORDER — ACETAMINOPHEN 650 MG RE SUPP: 650.0000 mg | Freq: Four times a day (QID) | RECTAL | Status: DC | PRN

## 2011-12-05 MED ORDER — LORAZEPAM 1 MG PO TABS
1.0000 mg | ORAL_TABLET | Freq: Three times a day (TID) | ORAL | Status: DC
Start: 2011-12-05 — End: 2011-12-05

## 2011-12-05 MED ORDER — CARVEDILOL 6.25 MG PO TABS
6.2500 mg | ORAL_TABLET | Freq: Two times a day (BID) | ORAL | Status: DC
  Administered 2011-12-05 – 2011-12-09 (×8): 6.25 mg via ORAL
  Filled 2011-12-05 (×11): qty 1

## 2011-12-05 MED ORDER — ALBUTEROL SULFATE (5 MG/ML) 0.5% IN NEBU
2.5000 mg | INHALATION_SOLUTION | Freq: Two times a day (BID) | RESPIRATORY_TRACT | Status: DC
  Administered 2011-12-06 – 2011-12-09 (×7): 2.5 mg via RESPIRATORY_TRACT
  Filled 2011-12-05 (×7): qty 0.5

## 2011-12-05 MED ORDER — ALBUTEROL SULFATE (5 MG/ML) 0.5% IN NEBU: 2.5000 mg | INHALATION_SOLUTION | RESPIRATORY_TRACT | Status: DC | PRN

## 2011-12-05 MED ORDER — IPRATROPIUM BROMIDE 0.02 % IN SOLN
0.5000 mg | Freq: Two times a day (BID) | RESPIRATORY_TRACT | Status: DC
  Administered 2011-12-06 – 2011-12-09 (×7): 0.5 mg via RESPIRATORY_TRACT
  Filled 2011-12-05 (×6): qty 2.5

## 2011-12-05 MED ORDER — SODIUM CHLORIDE 0.9 % IV BOLUS (SEPSIS)
1000.0000 mL | Freq: Once | INTRAVENOUS | Status: AC
  Administered 2011-12-05: 1000 mL via INTRAVENOUS

## 2011-12-05 MED ORDER — PANTOPRAZOLE SODIUM 40 MG PO TBEC
40.0000 mg | DELAYED_RELEASE_TABLET | Freq: Every day | ORAL | Status: DC
  Administered 2011-12-05 – 2011-12-09 (×5): 40 mg via ORAL
  Filled 2011-12-05 (×6): qty 1

## 2011-12-05 NOTE — ED Notes (Signed)
Awaiting [facility].

## 2011-12-05 NOTE — ED Notes (Signed)
[id number]> Expected date:12/05/11<BR> Expected time:<BR> Means of arrival:<BR> Comments:<BR> EMS GC - aloc

## 2011-12-05 NOTE — ED Notes (Signed)
Pt whispering, "can I talk to you?",  "I think my husband is trying to kill me"  Pt unable to follow or participate in conversation, choosing inappropriate words.

## 2011-12-05 NOTE — ED Notes (Signed)
Pt in via ems with altered mental status states off of meds for several days per ems pt has become worse and wanted her evaluated

## 2011-12-05 NOTE — ED Notes (Signed)
[facility] here to transport patient to [place]. 

## 2011-12-05 NOTE — ED Notes (Signed)
Attempt to call report to [address] at [facility] unsuccessful. Receiving RN not available for report; will return call to ED when available for report.
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
## 2011-12-05 NOTE — Progress Notes (Signed)
Patient arrived from [facility]  Dr. Sidney Ace at bedside.

## 2011-12-05 NOTE — Progress Notes (Signed)
RT called to assess pt. RN stated pt. Sat in 70's.While assessing pt. RT noticed [place] was not hooked up to oxygen. RT gave breathing treatment. Pt. Doing better. RT will continue to monitor.
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
## 2011-12-05 NOTE — ED Notes (Signed)
Pt in via ems states has been confused denies pain or discomfort family states has increased confusion over the past several days

## 2011-12-06 DIAGNOSIS — F319 Bipolar disorder, unspecified: Secondary | ICD-10-CM

## 2011-12-06 DIAGNOSIS — R404 Transient alteration of awareness: Secondary | ICD-10-CM

## 2011-12-06 DIAGNOSIS — E860 Dehydration: Secondary | ICD-10-CM | POA: Diagnosis present

## 2011-12-06 DIAGNOSIS — E119 Type 2 diabetes mellitus without complications: Secondary | ICD-10-CM

## 2011-12-06 DIAGNOSIS — J4489 Other specified chronic obstructive pulmonary disease: Secondary | ICD-10-CM

## 2011-12-06 DIAGNOSIS — F22 Delusional disorders: Secondary | ICD-10-CM | POA: Diagnosis present

## 2011-12-06 DIAGNOSIS — F0390 Unspecified dementia without behavioral disturbance: Secondary | ICD-10-CM | POA: Diagnosis present

## 2011-12-06 DIAGNOSIS — J449 Chronic obstructive pulmonary disease, unspecified: Secondary | ICD-10-CM

## 2011-12-06 LAB — URINE CULTURE: Culture  Setup Time: 201304062028

## 2011-12-06 LAB — BASIC METABOLIC PANEL
CO2: 36 mEq/L — ABNORMAL HIGH (ref 19–32)
Chloride: 96 mEq/L (ref 96–112)
Glucose, Bld: 148 mg/dL — ABNORMAL HIGH (ref 70–99)
Potassium: 4.3 mEq/L (ref 3.5–5.1)
Sodium: 136 mEq/L (ref 135–145)

## 2011-12-06 LAB — CBC
Hemoglobin: 11.8 g/dL — ABNORMAL LOW (ref 12.0–15.0)
MCH: 29.9 pg (ref 26.0–34.0)
MCV: 91.6 fL (ref 78.0–100.0)
RBC: 3.95 MIL/uL (ref 3.87–5.11)

## 2011-12-06 MED ORDER — QUETIAPINE FUMARATE 25 MG PO TABS
25.0000 mg | ORAL_TABLET | ORAL | Status: AC
  Administered 2011-12-06: 25 mg via ORAL
  Filled 2011-12-06: qty 1

## 2011-12-06 MED ORDER — POLYETHYLENE GLYCOL 3350 17 G PO PACK
17.0000 g | PACK | Freq: Every day | ORAL | Status: DC | PRN
  Filled 2011-12-06: qty 1

## 2011-12-06 MED ORDER — SIMETHICONE 80 MG PO CHEW
80.0000 mg | CHEWABLE_TABLET | Freq: Four times a day (QID) | ORAL | Status: DC | PRN
  Administered 2011-12-06: 80 mg via ORAL
  Filled 2011-12-06: qty 1

## 2011-12-06 MED ORDER — QUETIAPINE FUMARATE 25 MG PO TABS
25.0000 mg | ORAL_TABLET | Freq: Every day | ORAL | Status: DC
  Administered 2011-12-06 – 2011-12-08 (×3): 25 mg via ORAL
  Filled 2011-12-06 (×5): qty 1

## 2011-12-06 NOTE — Progress Notes (Signed)
Daily Progress Note Family Medicine Teaching Service Pager # [phone]  Subjective: Interval History: none. Slept well. Had a snack last PM that she cannot remember.   Objective: Vital signs in last 24 hours: Filed Vitals:   12/05/11 2030 12/05/11 2110 12/05/11 2151 12/06/11 0548  BP: 164/89 172/108  159/84  Pulse:  90  77  Temp:  97 F (36.1 C)  98.4 F (36.9 C)  TempSrc:  Oral  Oral  Resp:  20  20  SpO2:  70% 98% 95%  2 L [place]   General appearance: alert, cooperative and no distress Lungs: scattered expiratory wheezing bilaterally. Symmetrically decreased BS bilaterally.  Heart: regular rate and rhythm, S1, S2 normal, no murmur, click, rub or gallop Abdomen: soft, non-tender; bowel sounds normal; no masses,  no organomegaly Extremities: extremities normal, atraumatic, no cyanosis or edema Neurologic: Grossly normal  Pertinent Labs:  Na 132 > 136  Cl 90 >96 CO2 36, Glucose 148 sOsm 284  Last A1c 6.3  UA: 250 glucose, > 80 ketones, 100 protein, neg blood, LE, nitrites, sp grav 1.027  UDS: negative; Tylenol< 15; Salicylate< 2; BAL < 11; Ammonia 27  BNP 122.6  WBC 10.3 >8.2 INR 0.93  Lactic acid 1.1, PCT <0.1  pCO2 70.6 on ABG   Studies:  EKG 12/05/11: NSR, QTc 402  CT head w/o contras 4/6/13t: no acute intracranial abnormalities  CXR 12/05/11: negative  Mammogram 8/12: BI-RADS 1, repeat in 1 year  Medications: reviewed all schedule, infusions and prn medications   Assessment/Plan: 61 yo F presents with worsening short term memory loss and reports of persecution.  1. Altered mental status: stable this AM. Still with short term memory impairment P:  -Change to in patient status - D/C IVF -soft diet since patient forgot dentures  -SW consult to assist with establishing goals of care and discussing post-hospital placement.  2. COPD: Improved. In mid 90s on 2 L [place].   P:  -duonebs BID per home regimen and q 4 prn  -supplemental O2 with goal O2 sats 88-92% as patient is  likely a mild CO2 retainer  -prednisone 50 mg PO q D x 5-10 days   3. DM: last A1c 6.7 Plan for continue home metformin. Patient may nedd SSI while on steroids.   4. HTN: elevated BP this AM 159/84.  P: -Continue home anti-hypertensive regimen.  -D/C IVFs  5. CAD s/p drug eluting stent: continue plavix and ASA.   6. FEN/GI:  -soft diet    7. DVT PPX: Lovenox 40 q daily.   8. Dispo: pending clinical improvement and social work assistance it appears that placement in a short term rehab facility would be in the patients best interest.   LOS: 1 day   Benny Deutschman 7:51 AM, 12/06/11

## 2011-12-06 NOTE — Progress Notes (Signed)
CSW received consult for SNF. Will await PT/OT eval with level of care recommendations as they will be required by pt's insurance for SNF prior approval.  Dellie Burns, MSW, LCSWA [phone] (weekend)

## 2011-12-06 NOTE — Progress Notes (Signed)
Patient settled in room.

## 2011-12-06 NOTE — H&P (Signed)
I interviewed and examined this patient and discussed the care plan with Dr. Armen Pickup and the [facility] team and agree with assessment and plan as documented in the admission note for yesterday. I saw her last admission and did a MMSE exam then. Since her Vitamin B12 was in the indeterminate range, I ordered a Methylmalonic Acid test today.    Trena Dunavan A. Sheffield Slider, MD Family Medicine Teaching Service Attending  12/06/2011 11:18 AM

## 2011-12-06 NOTE — Progress Notes (Signed)
I interviewed and examined this patient and discussed the care plan with Dr. Armen Pickup and the [facility] team and agree with assessment and plan as documented in the progress note for today. I had seen her near the end of her last hospitalization and recommended starting Zoloft and considering Seroquel as a mood stabilizer. Her daughter and husband believe that her psychiatric symptoms got worse after that discharge. The patient reports occasional muscle jerks. As can sometimes happen in Bipolar patients not on a mood stabilizer, an SSRI could produce psychotic ideation. I agree with stopping the Zoloft and recommend starting Seroquel 25 mg this afternoon then a dose of 25 mg nightly hs. If her paranoid thought improve on this, we could repeat the MMSE to see if Aricept is indicated. I ordered heavy metal urine screen to reassure the patient that she is not being poisoned.     Lachlan Pelto A. Sheffield Slider, MD Family Medicine Teaching Service Attending  12/06/2011 2:11 PM

## 2011-12-07 ENCOUNTER — Ambulatory Visit: Payer: Medicare Other | Admitting: Oncology

## 2011-12-07 ENCOUNTER — Ambulatory Visit: Payer: Medicare Other | Admitting: Family Medicine

## 2011-12-07 ENCOUNTER — Other Ambulatory Visit: Payer: Medicare Other

## 2011-12-07 LAB — BASIC METABOLIC PANEL
BUN: 8 mg/dL (ref 6–23)
CO2: 37 mEq/L — ABNORMAL HIGH (ref 19–32)
Chloride: 99 mEq/L (ref 96–112)
Creatinine, Ser: 0.55 mg/dL (ref 0.50–1.10)

## 2011-12-07 NOTE — Progress Notes (Signed)
Clinical Social Work Department BRIEF PSYCHOSOCIAL ASSESSMENT 12/07/2011  Patient:  Maria Gaines, Maria Gaines     Account Number:  [id number]     Admit date:  12/05/2011  Clinical Social Worker:  Lourdes Sledge  Date/Time:  12/07/2011 03:16 PM  Referred by:  Physician  Date Referred:  12/06/2011 Referred for  SNF Placement   Other Referral:   Interview type:  Family Other interview type:   Spouse    PSYCHOSOCIAL DATA Living Status:  HUSBAND Admitted from facility:   Level of care:   Primary support name:  Madonna Flegal [phone] Primary support relationship to patient:  SPOUSE Degree of support available:   Spouse states he is pt primary caregiver and does not leave her alone unless he needs to run to the store.    CURRENT CONCERNS Current Concerns  Post-Acute Placement   Other Concerns:    SOCIAL WORK ASSESSMENT / PLAN CSW received referral for placement however PT/OT has not evaluated pt. CSW spoke with pt spouse Maria Gaines [phone]. CSW informed pt spouse that there is a possibility PT/OT may recommended placement (per MD note) and CSW wanted to discuss placement options with spouse and explore what his thoughts are regarding placement. Spouse stated he would prefer for pt to return however will be open to PT/OT recommendation. Spouse also stated he stays home with spouse and is able to provide 24hr care for pt. CSW informed spouse that she will follow up with spouse once PT/OT evaluations are completed.   Assessment/plan status:  Psychosocial Support/Ongoing Assessment of Needs Other assessment/ plan:   Information/referral to community resources:       PATIENT'S/FAMILY'S RESPONSE TO PLAN OF CARE: Pt presents with altered mental status, oriented to situation and location but not to time. CSW unsure if she is oriented to person. CSW spoke with pt spouse who was pleasant and provided helpful information to CSW. Spouse has CSW contact information. CSW will continue to  follow.    Theresia Bough, MSW, Theresia Majors [phone]

## 2011-12-07 NOTE — Progress Notes (Signed)
FMTS Attending Note  Patient seen and examined by me; she is pleasant and oriented to "[facility]" but cannot tell me the year ("2004"), month or season (tries to relate to "events in my church" ([employer]), but cannot recall that it was [place] 8 days ago). Does admit to auditory and visual hallucinations at home, which do not direct her behavior.  Had heard her dead brother talking with her husband soon after brother's death.)  No active hallucinations, no SI or HI.  Agree with plan for psych consult; if patient continues to improve with Seroquel and is convinced to take the medication, may discharge before completion of psychiatry consult, as she does not appear to be an increased risk to herself or others at this time. Paula Compton, MD

## 2011-12-07 NOTE — Evaluation (Signed)
Physical Therapy One-Time Evaluation Patient Details Name: Maria Gaines MRN: [id number] DOB: Jan 08, 1951 Today's Date: 12/07/2011  Problem List:  Patient Active Problem List  Diagnoses  . BREAST CANCER  . DIABETES MELLITUS II, UNCOMPLICATED  . HYPERCHOLESTEROLEMIA  . BIPOLAR DISORDER  . PANIC ATTACKS  . HYPERTENSION, BENIGN SYSTEMIC  . CORONARY, ARTERIOSCLEROSIS  . CHF - EJECTION FRACTION < 50%  . ALLERGIC RHINITIS  . CHRONIC OBSTRUCTIVE PULMONARY DISEASE, ACUTE EXACERBATION  . COPD  . GASTROESOPHAGEAL REFLUX, NO ESOPHAGITIS  . BACK PAIN, CHRONIC, INTERMITTENT  . INSOMNIA  . OXYGEN-USE OF SUPPLEMENTAL  . Memory change  . Confusion  . Presence of stent in Proximal Right Coronary Artery   . Vertigo  . Delusions  . Paranoia  . Rapidly progressive dementia  . Dehydration    Past Medical History:  Past Medical History  Diagnosis Date  . Visual field defect 03/09/11    Visual field defect right eye. Dr Cammy Copa optomotrist. plans rechck 6 m  . Hypertension   . Diabetes mellitus   . COPD (chronic obstructive pulmonary disease)   . Breast cancer 2003  . Vertigo   . Shortness of breath   . Asthma     per patient  . GERD (gastroesophageal reflux disease)    Past Surgical History:  Past Surgical History  Procedure Date  . Tubal ligation   . Coronary angioplasty with stent placement 11/30/2003     [name], M.D, drug eluting stent     PT Assessment/Plan/Recommendation PT Assessment Clinical Impression Statement: Patient presents at functional baseline (which is limited with tolerance due to COPD.)  She has no acute skilled PT needs at this time; however, due to dementia may need 24 hour supervision for safety.  Spouse reports he takes odd jobs and is not always home.  May need Social Work consult to look into ALF. PT Recommendation/Assessment: Patent does not need any further PT services No Skilled PT: Patient at baseline level of functioning PT  Recommendation Follow Up Recommendations: No PT follow up;Supervision/Assistance - 24 hour Equipment Recommended: None recommended by PT PT Goals     PT Evaluation Precautions/Restrictions    Prior Functioning  Home Living Lives With: Spouse Type of Home: House Home Layout: One level Home Access: Stairs to enter Entrance Stairs-Rails: Lawyer of Steps: 4 Home Adaptive Equipment: None Prior Function Level of Independence: Independent with basic ADLs;Needs assistance with homemaking;Independent with gait;Independent with transfers Comments: Spouse states if he left her alone he would come home with paramedics at his house.  Hopes new medicine will help her stay more even keel. Cognition Cognition Arousal/Alertness: Awake/alert Overall Cognitive Status: History of cognitive impairments History of Cognitive Impairment: Appears at baseline functioning Orientation Level: Oriented to person;Oriented to place;Disoriented to time;Disoriented to situation Cognition - Other Comments: knew it was spring, given 3 choices for month (Mar,Apr,May) stated March; year "13,000" Sensation/Coordination Sensation Light Touch: Appears Intact Extremity Assessment RLE Assessment RLE Assessment: Within Functional Limits LLE Assessment LLE Assessment: Within Functional Limits Mobility (including Balance) Bed Mobility Bed Mobility: No (sitting at edge of bed) Transfers Transfers: Yes Sit to Stand: 6: Modified independent (Device/Increase time) Stand to Sit: 6: Modified independent (Device/Increase time) Ambulation/Gait Ambulation/Gait: Yes Ambulation/Gait Assistance: 6: Modified independent (Device/Increase time) Ambulation Distance (Feet): 300 Feet (plus additional 40' after seated rest) Assistive device: None Gait Pattern: Step-through pattern Gait velocity: WFL for community mobility  High Level Balance High Level Balance Activites: Head turns;Turns High Level  Balance Comments: able  to stand in hallway and adjust socks lifting one foot holding onto wall rail independently Exercise    End of Session PT - End of Session Activity Tolerance: Patient limited by fatigue (SpO2 86% amb on 2L O2, incr to 91% pursed lip breathing) Patient left: in bed;with family/visitor present;with bed alarm set General Behavior During Session: [facility] for tasks performed (eager for discharge) Cognition: Impaired, at baseline  [facility] 12/07/2011, 4:37 PM

## 2011-12-07 NOTE — Progress Notes (Signed)
FMTS Attending Daily Note: Maria Levy MD [phone] pager office [phone] I  have seen and examined this patient, reviewed their chart. I have discussed this patient with the resident. I agree with the resident's findings, assessment and care plan. Maria Gaines is my continuity clinic patient. I actually had a fa,ily meeting scheduled with her today at 11:00 am. Over the last year or so her mental status has waxed and waned---she has become more forgetful, more paranoid. Her paranoia centers around her relationship with her husband Maria Gaines. Temporally, this all seemed to start (or at least worsen) about the time that I think (in retrospect) that she stopped her risperdal. Certainly today, she seems much more like her baseline. I suspect this is related to successfully getting her to take seroquel. We also stopped the SSRI.  Maria Gaines has denied vehemently over the last year that she ever had a diagnosis of bipolar disorder. I have tried very unsuccessfuly outpatient to restart this medicine. We even had psychiatry see her inpatient at her last hospital visit. Currently, she is amenable to medication changes---I discussed them with her in broad terms. It is unclear to me whether or not she will actually take th seroquel. My best suggestion would be to do a trail and if she does not succeed, then we could consider depo meds.

## 2011-12-07 NOTE — Progress Notes (Signed)
Daily Progress Note Maria Gaines. Maria Gaines, M.D., M.B.A  Family Medicine PGY-1 Pager [phone]  Subjective: Psych: This morning the patient is very pleasant, is oriented to location and situation, but not time; poor recall; denies any hallucinations or thoughts that someone is trying to harm her  COPD: denies SOB   Objective: Vital signs in last 24 hours: Temp:  [98.1 F (36.7 C)-99.4 F (37.4 C)] 99.4 F (37.4 C) (04/08 1000) Pulse Rate:  [69-88] 84  (04/08 1023) Resp:  [18-20] 18  (04/08 1000) BP: (107-134)/(59-76) 122/73 mmHg (04/08 1023) SpO2:  [92 %-98 %] 92 % (04/08 1000) Weight change:  Last BM Date: 12/06/11  Intake/Output from previous day: 04/07 0701 - 04/08 0700 In: -  Out: 2 [Urine:1; Stool:1] Intake/Output this shift: Total I/O In: 240 [P.O.:240] Out: -   General appearance: alert, cooperative and no distress  Lungs: scattered expiratory wheezing bilaterally. Symmetrically decreased BS bilaterally.  Heart: regular rate and rhythm, S1, S2 normal, no murmur, click, rub or gallop  Abdomen: soft, non-tender; bowel sounds normal; no masses, no organomegaly  Extremities: extremities normal, atraumatic, no cyanosis or edema  Neurologic: Grossly normal Psych: normal thought content; poor recall; disoriented to time (thinks it 2013); denies hallucinations; no Maria/HI    Lab Results:  Basename 12/06/11 0650 12/05/11 2203  WBC 8.2 9.6  HGB 11.8* 11.9*  HCT 36.2 36.3  PLT 244 248   BMET  Basename 12/07/11 0520 12/06/11 0650  NA 139 136  K 3.9 4.3  CL 99 96  CO2 37* 36*  GLUCOSE 123* 148*  BUN 8 10  CREATININE 0.55 0.47*  CALCIUM 9.3 9.1    Studies/Results:   Medications: I have reviewed the patient's current medications.  Assessment/Plan: 61 year old female with worsening memory and psychosis possibly due to dementia.   1. AMS: improving but still mild disorientation, due in part to dementia  - continue seroquel 25 mg nightly  - called for  Psychiatry consultation   - continue lorazepam 1 mg TID for anxiety   2. COPD - home duonoebs BID,  advair BID, prednisone 50 mg - day 3 for exacerbation  3. DM - metformin  4. CAD - ASA, plavix, carvedilol, diltiazem, benazepril  5. PPX - heparin 5000 U [place] TID   6. FENGI - carb modified diet   7. Dispo - may need placement since worsening mental status and paranoia at home; obtain PT/OT consult   LOS: 2 days   Mat Carne 12/07/2011, 2:17 PM

## 2011-12-08 DIAGNOSIS — F22 Delusional disorders: Secondary | ICD-10-CM

## 2011-12-08 DIAGNOSIS — F0390 Unspecified dementia without behavioral disturbance: Principal | ICD-10-CM

## 2011-12-08 DIAGNOSIS — F319 Bipolar disorder, unspecified: Secondary | ICD-10-CM

## 2011-12-08 NOTE — Progress Notes (Signed)
FMTS Attending Daily Note: [name] MD [phone] pager office [phone] I  have seen and examined this patient, reviewed their chart. I have discussed this patient with the resident. I agree with the resident's findings, assessment and care plan. She seems very tearful today---AxO x4. Does ramble a little about things that "happened" this weekend--but cannot really tell me what is bothering her about those events. "They were all trying to help me". Denies current hallucinations but does admit to voices almost daily for last few weeks (? Maybe months).  Voices just talk to her, do not direct her to do things.  She is amenable to the new medicine we have started (seroquel). Appreciate FMTS inpt excellent care. Have also discussed with Dr Mauricio Po and we are in agreement on approach.

## 2011-12-08 NOTE — Evaluation (Signed)
Occupational Therapy Evaluation Patient Details Name: Maria Gaines MRN: [id number] DOB: April 12, 1951 Today's Date: 12/08/2011  Problem List:  Patient Active Problem List  Diagnoses  . BREAST CANCER  . DIABETES MELLITUS II, UNCOMPLICATED  . HYPERCHOLESTEROLEMIA  . BIPOLAR DISORDER  . PANIC ATTACKS  . HYPERTENSION, BENIGN SYSTEMIC  . CORONARY, ARTERIOSCLEROSIS  . CHF - EJECTION FRACTION < 50%  . ALLERGIC RHINITIS  . CHRONIC OBSTRUCTIVE PULMONARY DISEASE, ACUTE EXACERBATION  . COPD  . GASTROESOPHAGEAL REFLUX, NO ESOPHAGITIS  . BACK PAIN, CHRONIC, INTERMITTENT  . INSOMNIA  . OXYGEN-USE OF SUPPLEMENTAL  . Memory change  . Confusion  . Presence of stent in Proximal Right Coronary Artery   . Vertigo  . Delusions  . Paranoia  . Rapidly progressive dementia  . Dehydration    Past Medical History:  Past Medical History  Diagnosis Date  . Visual field defect 03/09/11    Visual field defect right eye. Dr Cammy Copa optomotrist. plans rechck 6 m  . Hypertension   . Diabetes mellitus   . COPD (chronic obstructive pulmonary disease)   . Breast cancer 2003  . Vertigo   . Shortness of breath   . Asthma     per patient  . GERD (gastroesophageal reflux disease)    Past Surgical History:  Past Surgical History  Procedure Date  . Tubal ligation   . Coronary angioplasty with stent placement 11/30/2003     [name], M.D, drug eluting stent     OT Assessment/Plan/Recommendation OT Assessment Clinical Impression Statement: 61 yo female s/p increased confusion and decreased PO. Pt with hx of dementia and bipolar. pt currently back to baseline per husband .OT acute needs at this time OT Recommendation/Assessment: Patient does not need any further OT services OT Recommendation Follow Up Recommendations: No OT follow up Equipment Recommended: None recommended by OT  OT Evaluation Precautions/Restrictions  Precautions Precautions: Fall Restrictions Weight Bearing Restrictions:  No Prior Functioning Home Living Lives With: Spouse Type of Home: House Home Layout: One level Home Access: Stairs to enter Entrance Stairs-Rails: Lawyer of Steps: 4 Bathroom Toilet: Standard Home Adaptive Equipment: None Prior Function Level of Independence: Independent with basic ADLs;Needs assistance with homemaking;Independent with gait;Independent with transfers Driving: No Vocation: Retired ADL ADL Grooming: Performed;Wash/dry hands;Modified independent (min v/c for locating soap) Where Assessed - Grooming: Standing at sink Toilet Transfer: Performed;Modified independent Toilet Transfer Method: Proofreader: Regular height toilet Toileting - Clothing Manipulation: Performed;Modified independent Where Assessed - Toileting Clothing Manipulation: Sit to stand from 3-in-1 or toilet Toileting - Hygiene: Performed;Modified independent Where Assessed - Toileting Hygiene: Sit to stand from 3-in-1 or toilet Ambulation Related to ADLs: Pt ambulating supervision level and needed min v/c for managing oxygen tubing ADL Comments: Pt is at baseline for functioning per husband that is present Vision/Perception   none noted  Cognition Cognition Arousal/Alertness: Awake/alert Overall Cognitive Status: History of cognitive impairments Orientation Level: Oriented to person;Oriented to place;Disoriented to situation;Disoriented to time Sensation/Coordination Sensation Light Touch: Appears Intact Coordination Gross Motor Movements are Fluid and Coordinated: Yes Fine Motor Movements are Fluid and Coordinated: Yes Extremity Assessment RUE Assessment RUE Assessment: Within Functional Limits (grossly) LUE Assessment LUE Assessment: Within Functional Limits (grossly) Mobility  Bed Mobility Bed Mobility: Yes Supine to Sit: 6: Modified independent (Device/Increase time) Transfers Transfers: Yes Sit to Stand: 6: Modified independent  (Device/Increase time) Stand to Sit: 6: Modified independent (Device/Increase time) Exercises   End of Session OT - End of Session Equipment Utilized  During Treatment: Gait belt Activity Tolerance: Patient tolerated treatment well Patient left: in chair;with call bell in reach Nurse Communication: Mobility status for transfers General Behavior During Session: [name] for tasks performed Cognition: Impaired, at baseline   Lucile Shutters 12/08/2011, 1:41 PM  Pager: [phone]

## 2011-12-08 NOTE — Progress Notes (Signed)
OT Discharge Note  Patient is being discharged from OT services secondary to:    Goals met and no further therapy needs identified.  Please see latest Therapy Progress Note for current level of functioning and progress toward goals.  Progress and discharge plan and discussed with patient/caregiver and they    Agree   With husband present    Lucile Shutters   OTR/L Pager: [phone] Office: [phone] .

## 2011-12-08 NOTE — Progress Notes (Signed)
Daily Progress Note Maria Gaines. [name], M.D., M.B.A  Family Medicine PGY-1 Pager [phone]  Subjective: Patient sitting in bed eating breakfast, states that she feels like she is able to go home, is oriented to person and place but not time; denies visual or auditory hallucinations;   COPD: denies SOB   PT evaluation: Patient at baseline function and does not need PT  Objective: Vital signs in last 24 hours: Temp:  [97.6 F (36.4 C)-98.8 F (37.1 C)] 98 F (36.7 C) (04/09 1400) Pulse Rate:  [69-81] 77  (04/09 1400) Resp:  [18-19] 18  (04/09 1400) BP: (100-134)/(59-77) 105/66 mmHg (04/09 1400) SpO2:  [92 %-99 %] 95 % (04/09 1400) Weight change:  Last BM Date: 12/06/11  Intake/Output from previous day: 04/08 0701 - 04/09 0700 In: 840 [P.O.:840] Out: -  Intake/Output this shift: Total I/O In: 720 [P.O.:720] Out: -   General appearance: alert, cooperative and no distress  Lungs: scattered expiratory wheezing bilaterally. Symmetrically decreased BS bilaterally.  Heart: regular rate and rhythm, S1, S2 normal, no murmur, click, rub or gallop  Abdomen: soft, non-tender; bowel sounds normal; no masses, no organomegaly  Extremities: extremities normal, atraumatic, no cyanosis or edema  Neurologic: Grossly normal Psych: normal thought content; poor recall; disoriented to time; denies hallucinations; no Maria/HI    Lab Results:  Basename 12/06/11 0650 12/05/11 2203  WBC 8.2 9.6  HGB 11.8* 11.9*  HCT 36.2 36.3  PLT 244 248   BMET  Basename 12/07/11 0520 12/06/11 0650  NA 139 136  K 3.9 4.3  CL 99 96  CO2 37* 36*  GLUCOSE 123* 148*  BUN 8 10  CREATININE 0.55 0.47*  CALCIUM 9.3 9.1    Studies/Results:   Medications: I have reviewed the patient's current medications.  Assessment/Plan: 61 year old female with worsening memory and psychosis possibly due to dementia.   1. AMS: improving but still mild disorientation, due in part to dementia  - continue seroquel 25  mg nightly  - f/u psychiatry consultation   - continue lorazepam 1 mg TID for anxiety   2. COPD - home duonoebs BID,  advair BID, prednisone 50 mg - day 4 for exacerbation  3. DM - metformin  4. CAD - ASA, plavix, carvedilol, diltiazem, benazepril  5. PPX - heparin 5000 U [place] TID   6. FENGI - carb modified diet   7. Dispo - d/c if stable after psych consult and living situation safe with husband    LOS: 3 days   Mat Carne 12/08/2011, 5:47 PM

## 2011-12-08 NOTE — Consult Note (Signed)
Reason for Consult:Psychosis Referring Physician: Dr. Roslynn Amble   Maria Gaines is an 61 y.o. female.  HPI: 61 yo F with history of bipolar depression, memory impairment and confusion presents with worsening symptoms over the last week marked by refusing to eat and take her medications. Per the patient she is refusing because she believes her husband is trying to kill her. She denies being assaulted or attacked. She does report that she does not feel safe at home. Per her husband she has had a decline in mental status over the past few years marked by impaired short term memory (cannot remember taking medication or eating), paranoia, and delusions of persecution. Patient and her husband both deny recent illness  AXIS I Dementia, moderate, Bipolar Disorder, paranoid delusions most recent AXIS II Deferred AXIS III Past Medical History  Diagnosis Date  . Visual field defect 03/09/11    Visual field defect right eye. Dr Cammy Copa optomotrist. plans rechck 6 m  . Hypertension   . Diabetes mellitus   . COPD (chronic obstructive pulmonary disease)   . Breast cancer 2003  . Vertigo   . Shortness of breath   . Asthma     per patient  . GERD (gastroesophageal reflux disease)     Past Surgical History  Procedure Date  . Tubal ligation   . Coronary angioplasty with stent placement 11/30/2003     [name], M.D, drug eluting stent   AXIS IV Support at home, complex medical conditions, non-adherence to medication AXIS  GAF  45   History reviewed. No pertinent family history.  Social History:  reports that she quit smoking about 11 years ago. Her smoking use included Cigarettes. She has a 75 pack-year smoking history. She has never used smokeless tobacco. She reports that she does not drink alcohol or use illicit drugs.  Allergies:  Allergies  Allergen Reactions  . Codeine     "unclear of where i was at and who anyone was"  . Zolpidem Tartrate     REACTION: sleepwalking     Medications: I have reviewed the patient's current medications.  Results for orders placed during the hospital encounter of 12/05/11 (from the past 48 hour(s))  BASIC METABOLIC PANEL     Status: Abnormal   Collection Time   12/07/11  5:20 AM      Component Value Range Comment   Sodium 139  135 - 145 (mEq/L)    Potassium 3.9  3.5 - 5.1 (mEq/L)    Chloride 99  96 - 112 (mEq/L)    CO2 37 (*) 19 - 32 (mEq/L)    Glucose, Bld 123 (*) 70 - 99 (mg/dL)    BUN 8  6 - 23 (mg/dL)    Creatinine, Ser 1.61  0.50 - 1.10 (mg/dL)    Calcium 9.3  8.4 - 10.5 (mg/dL)    GFR calc non Af Amer >90  >90 (mL/min)    GFR calc Af Amer >90  >90 (mL/min)     No results found.  Review of Systems  Unable to perform ROS: other   Blood pressure 105/66, pulse 71, temperature 98 F (36.7 C), temperature source Oral, resp. rate 22, SpO2 96.00%. Physical Exam  Assessment/Plan: Chart reviewed  Interviewed pt with Psych CSW ~ 10 am 12/08/11 Pt is sitting on side of bed.  Her husband enters and is quiet.  He says he does most of the home care and helps take care of wife [pt].  He thinks she could  wash dishes faster: She washes one plate and has to sit down.  His comment suggests he may not appreciate the hypoxic state she lives in; asthma and COPD  She is alert oriented to person place and situation.  She admits to her confusion - not paranoia or fear of husband= and says she can get lost in her own home.   She chuckles and displays full affect  She denies thoughts of suicide/homicine.  Her judgment is fair, insight is fair.  RECOMMEND: 1.  Pt is admitting dementia but wants to return home; husband agreeable to provide care.No paranoia noted today 2. Pt needs outpatient intake evaluation to determine regimen for dementia and bipolar disorder 3. She has no record her of Namenda or Exlelon patch for dementia 4. Suggest referral to psychiatrist she may see after discharge for BPolar Disorder medications to avoid another  episode of paranoia directed at her husband. 5. No further psychiatric needs identified.  MD Psychiatrist signs off.  Candita Borenstein 12/08/2011, 11:43 PM

## 2011-12-08 NOTE — Progress Notes (Signed)
Clinical Social Work:  Following for [employer]  Patient is sitting up in bed and is engaged in conversation.  Reports she lives at home with her husband and has a bad memory.  She is not impulsive or aggressive, but rather very calm and pleasantly confused.  Able to tell where she is but does not understand why.  Patient plan is to return home with her husband and follow up with outpatient appointments.  Patient is no SI, HI or psychotic.  Patient's husband in room and agreeable to plan to take her home and not SNF.  No other needs voiced at this time.  Patient has medications for sleep and agitation already scheduled and is compliant per report.  CSW signed off.  Ashley Jacobs, MSW LCSW [phone]

## 2011-12-09 ENCOUNTER — Other Ambulatory Visit: Payer: Self-pay | Admitting: Family Medicine

## 2011-12-09 MED ORDER — QUETIAPINE FUMARATE 25 MG PO TABS: 25.0000 mg | ORAL_TABLET | Freq: Every day | ORAL | Status: DC

## 2011-12-09 NOTE — Progress Notes (Signed)
Daily Progress Note Maria Gaines. Maria Gaines, M.D., M.B.A  Family Medicine PGY-1 Pager [phone]  Subjective: No complaints, oriented to person place and situation  Psychiatry consultation 4/9 recommends outpatient management of bipolar disorder   Objective: Vital signs in last 24 hours: Temp:  [97.6 F (36.4 C)-98.6 F (37 C)] 98.6 F (37 C) (04/10 0700) Pulse Rate:  [70-77] 70  (04/10 0700) Resp:  [18-22] 18  (04/10 0700) BP: (105-131)/(62-84) 131/84 mmHg (04/10 0700) SpO2:  [93 %-96 %] 93 % (04/10 0759) Weight change:  Last BM Date: 12/06/11  Intake/Output from previous day: 04/09 0701 - 04/10 0700 In: 960 [P.O.:960] Out: -  Intake/Output this shift:    General appearance: alert, cooperative and no distress  Lungs: scattered expiratory wheezing bilaterally. Symmetrically decreased BS bilaterally.  Heart: regular rate and rhythm, S1, S2 normal, no murmur, click, rub or gallop  Abdomen: soft, non-tender; bowel sounds normal; no masses, no organomegaly  Extremities: extremities normal, atraumatic, no cyanosis or edema  Neurologic: Grossly normal Psych: normal thought content; poor recall; disoriented to time; denies hallucinations; no Maria/HI    Lab Results: No results found for this basename: WBC:2,HGB:2,HCT:2,PLT:2 in the last 72 hours BMET  [facility] 12/07/11 0520  NA 139  K 3.9  CL 99  CO2 37*  GLUCOSE 123*  BUN 8  CREATININE 0.55  CALCIUM 9.3    Studies/Results:   Medications: I have reviewed the patient's current medications.  Assessment/Plan: 61 year old female with worsening memory and psychosis possibly due to dementia.   1. AMS: improved to baseline dementia, no more hallucinations  - Ready for d/c on seroquel and ativan for anxiety; needs outpatient f/u with psychiatry   2. COPD - home duonoebs BID,  advair BID, prednisone 50 mg - day 4 for exacerbation  3. DM - metformin  4. CAD - ASA, plavix, carvedilol, diltiazem, benazepril  5. PPX -  heparin 5000 U [place] TID   6. FENGI - carb modified diet   7. Dispo -  today   LOS: 4 days   Mat Carne 12/09/2011, 8:21 AM

## 2011-12-09 NOTE — Progress Notes (Signed)
Pt and family given D/C instructions with understanding. Pt's RX sent to pharm for Pt to pick up. Pt D/C'd home with husband @ 1540 per MD order. Rema Fendt, RN

## 2011-12-09 NOTE — Discharge Instructions (Signed)
Mrs. Maria Gaines, Maria Gaines were hospitalized for your paranoid thoughts about your husband. We are glad that you thinking more clearly and feeling better. Please read that following instruction.   1. Continue taking Seroquel 25 mg  each night. This should help your mood. 2. DO NOT TAKE SERTALINE or any other medication from this category - which is called SSRI.  3. Continue taking all of your other medications at home except the ativan at night.  4. follow-up with Dr. [name] next week.   Take Care,   Dr. [name]

## 2011-12-09 NOTE — Discharge Summary (Signed)
Physician Discharge Summary  Patient ID: Maria Gaines MRN: [id number] DOB/AGE: 1951/03/23 61 y.o.  Admit date: 12/05/2011 Discharge date: 12/09/2011  Admission Diagnoses:  Discharge Diagnoses:  Principal Problem:  *Confusion Active Problems:  DIABETES MELLITUS II, UNCOMPLICATED  BIPOLAR DISORDER  COPD  Presence of stent in Proximal Right Coronary Artery   Delusions  Paranoia  Rapidly progressive dementia  Dehydration   Discharged Condition: good  Hospital Course: Maria Gaines is a 61 year old patient with COPD, CAD and dementia who presented with paranoia and visual and auditory hallucinations. Her paranoia included her husband trying to kill her. Her paranoia and hallucinations improved after having sertraline discontinued and started on Seroquel 25 mg nightly. An evaluation by a psychiatrist supported the medical regimen of seroquel and recommended outpatient follow-up with psychiatry. At the time of discharge her thought content was normal but she has poor short-term recall and was not oriented to time. She did have insight that her memory was declining and that her paranoid thoughts were not real. SHE SHOULD NEVER BE PRESCRIBED SSRI's IN THE FUTURE.   Maria Gaines was also treated for a COPD exacerbation with prednisone, duonebs, adviar and O2. Her O2 requirements was back to baseline of 2L prior to discharge. She was placed on insulin for diabetes and kept on her home regimens for CAD and HTN.   Consults: psychiatry  Significant Diagnostic Studies:  Urine toxicology negative Blood lead and arsenic levels normal  Alcohol normal  WBC 8.2  Treatments: see HPI  Discharge Exam: Blood pressure 131/84, pulse 70, temperature 98.6 F (37 C), temperature source Oral, resp. rate 18, SpO2 93.00%. General appearance: alert, cooperative and no distress  Lungs: scattered expiratory wheezing bilaterally. Symmetrically decreased BS bilaterally.  Heart: regular rate and rhythm, S1, S2  normal, no murmur, click, rub or gallop  Abdomen: soft, non-tender; bowel sounds normal; no masses, no organomegaly  Extremities: extremities normal, atraumatic, no cyanosis or edema  Neurologic: Grossly normal  Psych: normal thought content; poor recall; disoriented to time; denies hallucinations; no SI/HI    Disposition: 01-Home or Self Care  Discharge Orders    Future Appointments: Provider: Department: Dept Phone: Center:   12/16/2011 9:30 AM Nestor Ramp, MD [facility] [phone] [facility]     Medication List  As of 12/09/2011  9:55 AM   ASK your doctor about these medications         acetaminophen 500 MG tablet   Commonly known as: TYLENOL   Take 1,000 mg by mouth every 6 (six) hours as needed. For pain      ADVAIR DISKUS 250-50 MCG/DOSE Aepb   Generic drug: Fluticasone-Salmeterol   Inhale 2 puffs into the lungs 2 (two) times daily.      aspirin 81 MG tablet   Take 81 mg by mouth daily.      atorvastatin 80 MG tablet   Commonly known as: LIPITOR   TAKE 1 TABLET (80 MG TOTAL) BY MOUTH AT BEDTIME.      benazepril 5 MG tablet   Commonly known as: LOTENSIN   Take 1 tablet (5 mg total) by mouth daily.      carvedilol 6.25 MG tablet   Commonly known as: COREG   Take 6.25 mg by mouth 2 (two) times daily with a meal.      diltiazem 180 MG 24 hr capsule   Commonly known as: CARDIZEM CD   Take 1 capsule (180 mg total) by mouth daily.  gabapentin 300 MG capsule   Commonly known as: NEURONTIN   Take 300 mg by mouth 2 (two) times daily.      ipratropium-albuterol 0.5-2.5 (3) MG/3ML Soln   Commonly known as: DUONEB   1 treatment every 12 hours SCHEDULED and every 2 hours as needed for coughing or wheezing / shortness of breath      KLOR-CON 10 10 MEQ CR tablet   Generic drug: potassium chloride   Take 10 mEq by mouth 2 (two) times daily.      LORazepam 2 MG tablet   Commonly known as: ATIVAN   Take 0.5 tablets (1 mg total) by mouth 3 (three) times daily.       meclizine 25 MG tablet   Commonly known as: ANTIVERT   TAKE 1 TABLET THREE TIMES A DAY      metFORMIN 1000 MG tablet   Commonly known as: GLUCOPHAGE   TAKE 1 TABLET (1,000 MG TOTAL) BY MOUTH 2 (TWO) TIMES DAILY WITH A MEAL.      omeprazole 40 MG capsule   Commonly known as: PRILOSEC   TAKE ONE CAPSULE BY MOUTH EVERY DAY      PLAVIX 75 MG tablet   Generic drug: clopidogrel   Take 75 mg by mouth daily.      predniSONE 50 MG tablet   Commonly known as: DELTASONE   Take 1 tablet by mouth daily for total of 5 days.           Follow-up Information    Follow up with [name], MD on 12/16/2011. (9:30 AM)          Signed: Mat Carne 12/09/2011, 9:55 AM

## 2011-12-10 ENCOUNTER — Telehealth: Payer: Self-pay | Admitting: Family Medicine

## 2011-12-10 NOTE — Telephone Encounter (Signed)
Patient was recently hospitalized with Dx of Bi=Polar, med change Seralotin was discharged and added Seroquel and changed Klorcon to 10 meqs BID.  The Prednisone was discharged.  The nurse wants to clarify that this is ok and that nursing can continue 1xwk for med management.  A verbal ok is what they need.

## 2011-12-10 NOTE — Telephone Encounter (Signed)
Will forward to Dr. [name] for advice.Maria Gaines [place]

## 2011-12-11 LAB — CULTURE, BLOOD (ROUTINE X 2)
Culture  Setup Time: 201304062028
Culture  Setup Time: 201304062028
Culture: NO GROWTH

## 2011-12-11 LAB — METHYLMALONIC ACID, SERUM: Methylmalonic Acid, Quantitative: 0.55 umol/L — ABNORMAL HIGH (ref <0.40)

## 2011-12-11 NOTE — Telephone Encounter (Signed)
Called and spoke with [facility] @ [facility].  Patient was admitted to hospital on 12/05/11 with bipolar and discharged home on 12/09/11.  Discussed meds on discharge summary---patient currently taking Klor Con 10 mEq twice a day.  Patient does not have Rx for prednisone and will need Rx sent to [facility].  Copy of discharge summary faxed to [phone] (Attn:  Wonda Cerise @ [facility]).  Will route note to Dr. [name] for verbal order for medication management once a week.  Gaylene Brooks, RN

## 2011-12-11 NOTE — Telephone Encounter (Signed)
Dear Maria Gaines Team Please fax a copy of Maria Gaines discharge summary---it has Maria Gaines med list.---I have NO IDEA what seralotin is?????. She was started on seroquel. We did send Maria Gaines home for a total of five days prednisone--. I DO NOT see where we stopped Maria Gaines klor chon---where are they getting their info??? I need to know this answer as this is pretty  Important---do they NOT have a d/c summary?  Please fax that and let me kow THANKS! [name] Ps d/c summary is under NOTEs--remove filters to find it. THANKS!

## 2011-12-14 NOTE — Telephone Encounter (Signed)
Still needs to know if she can have verbal order to continue care for 3-4 weeks

## 2011-12-15 NOTE — Telephone Encounter (Signed)
lvm with Morrie Sheldon to inform her that d/c summary has been faxed and that it is ok to continue with care for the next 3-4 weeks.Laureen Ochs, Viann Shove

## 2011-12-16 ENCOUNTER — Telehealth: Payer: Self-pay | Admitting: Family Medicine

## 2011-12-16 ENCOUNTER — Other Ambulatory Visit: Payer: Self-pay

## 2011-12-16 ENCOUNTER — Inpatient Hospital Stay: Payer: Medicare Other | Admitting: Family Medicine

## 2011-12-16 ENCOUNTER — Inpatient Hospital Stay (HOSPITAL_COMMUNITY)
Admission: EM | Admit: 2011-12-16 | Discharge: 2011-12-22 | DRG: 885 | Disposition: A | Discharge status: Skilled Nursing Facility | Payer: Medicare Other | Attending: Family Medicine | Admitting: Family Medicine

## 2011-12-16 ENCOUNTER — Emergency Department (HOSPITAL_COMMUNITY): Payer: Medicare Other

## 2011-12-16 ENCOUNTER — Encounter (HOSPITAL_COMMUNITY): Payer: Self-pay | Admitting: *Deleted

## 2011-12-16 DIAGNOSIS — Z853 Personal history of malignant neoplasm of breast: Secondary | ICD-10-CM

## 2011-12-16 DIAGNOSIS — R410 Disorientation, unspecified: Secondary | ICD-10-CM | POA: Diagnosis present

## 2011-12-16 DIAGNOSIS — J449 Chronic obstructive pulmonary disease, unspecified: Secondary | ICD-10-CM | POA: Diagnosis present

## 2011-12-16 DIAGNOSIS — Z87891 Personal history of nicotine dependence: Secondary | ICD-10-CM

## 2011-12-16 DIAGNOSIS — K219 Gastro-esophageal reflux disease without esophagitis: Secondary | ICD-10-CM | POA: Insufficient documentation

## 2011-12-16 DIAGNOSIS — F0390 Unspecified dementia without behavioral disturbance: Secondary | ICD-10-CM | POA: Diagnosis present

## 2011-12-16 DIAGNOSIS — E860 Dehydration: Secondary | ICD-10-CM

## 2011-12-16 DIAGNOSIS — I2510 Atherosclerotic heart disease of native coronary artery without angina pectoris: Secondary | ICD-10-CM | POA: Diagnosis present

## 2011-12-16 DIAGNOSIS — F319 Bipolar disorder, unspecified: Secondary | ICD-10-CM | POA: Diagnosis present

## 2011-12-16 DIAGNOSIS — E119 Type 2 diabetes mellitus without complications: Secondary | ICD-10-CM | POA: Diagnosis present

## 2011-12-16 DIAGNOSIS — F22 Delusional disorders: Secondary | ICD-10-CM

## 2011-12-16 DIAGNOSIS — Z9981 Dependence on supplemental oxygen: Secondary | ICD-10-CM

## 2011-12-16 DIAGNOSIS — J4489 Other specified chronic obstructive pulmonary disease: Secondary | ICD-10-CM | POA: Diagnosis present

## 2011-12-16 DIAGNOSIS — F29 Unspecified psychosis not due to a substance or known physiological condition: Principal | ICD-10-CM | POA: Diagnosis present

## 2011-12-16 DIAGNOSIS — I10 Essential (primary) hypertension: Secondary | ICD-10-CM | POA: Insufficient documentation

## 2011-12-16 LAB — BASIC METABOLIC PANEL
CO2: 30 mEq/L (ref 19–32)
Calcium: 9.9 mg/dL (ref 8.4–10.5)
Chloride: 92 mEq/L — ABNORMAL LOW (ref 96–112)
Sodium: 134 mEq/L — ABNORMAL LOW (ref 135–145)

## 2011-12-16 LAB — CBC
Platelets: 320 10*3/uL (ref 150–400)
RBC: 4.4 MIL/uL (ref 3.87–5.11)
WBC: 11.1 10*3/uL — ABNORMAL HIGH (ref 4.0–10.5)

## 2011-12-16 LAB — URINALYSIS, ROUTINE W REFLEX MICROSCOPIC
Glucose, UA: 500 mg/dL — AB
Leukocytes, UA: NEGATIVE
Protein, ur: >300 mg/dL — AB

## 2011-12-16 LAB — URINE MICROSCOPIC-ADD ON

## 2011-12-16 MED ORDER — SODIUM CHLORIDE 0.9 % IV BOLUS (SEPSIS)
500.0000 mL | Freq: Once | INTRAVENOUS | Status: AC
  Administered 2011-12-16: 500 mL via INTRAVENOUS

## 2011-12-16 MED ORDER — SODIUM CHLORIDE 0.9 % IJ SOLN
3.0000 mL | Freq: Two times a day (BID) | INTRAMUSCULAR | Status: DC
  Administered 2011-12-16 – 2011-12-21 (×8): 3 mL via INTRAVENOUS

## 2011-12-16 MED ORDER — SODIUM CHLORIDE 0.9 % IJ SOLN: 3.0000 mL | INTRAMUSCULAR | Status: DC | PRN

## 2011-12-16 MED ORDER — QUETIAPINE FUMARATE 25 MG PO TABS
25.0000 mg | ORAL_TABLET | Freq: Every day | ORAL | Status: DC
  Administered 2011-12-16: 25 mg via ORAL
  Filled 2011-12-16 (×2): qty 1

## 2011-12-16 MED ORDER — BENAZEPRIL HCL 5 MG PO TABS
5.0000 mg | ORAL_TABLET | Freq: Every day | ORAL | Status: DC
  Administered 2011-12-17 – 2011-12-22 (×6): 5 mg via ORAL
  Filled 2011-12-16 (×7): qty 1

## 2011-12-16 MED ORDER — GABAPENTIN 300 MG PO CAPS
300.0000 mg | ORAL_CAPSULE | Freq: Two times a day (BID) | ORAL | Status: DC
  Administered 2011-12-16: 300 mg via ORAL
  Filled 2011-12-16 (×3): qty 1

## 2011-12-16 MED ORDER — CLOPIDOGREL BISULFATE 75 MG PO TABS
75.0000 mg | ORAL_TABLET | Freq: Every day | ORAL | Status: DC
  Administered 2011-12-17 – 2011-12-22 (×6): 75 mg via ORAL
  Filled 2011-12-16 (×6): qty 1

## 2011-12-16 MED ORDER — DILTIAZEM HCL ER COATED BEADS 180 MG PO CP24
180.0000 mg | ORAL_CAPSULE | Freq: Every day | ORAL | Status: DC
Start: 2011-12-16 — End: 2011-12-22
  Administered 2011-12-17 – 2011-12-22 (×6): 180 mg via ORAL
  Filled 2011-12-16 (×7): qty 1

## 2011-12-16 MED ORDER — LORAZEPAM 1 MG PO TABS: 1.0000 mg | ORAL_TABLET | Freq: Three times a day (TID) | ORAL | Status: DC

## 2011-12-16 MED ORDER — ASPIRIN 81 MG PO TABS: 81.0000 mg | ORAL_TABLET | Freq: Every day | ORAL | Status: DC

## 2011-12-16 MED ORDER — SODIUM CHLORIDE 0.9 % IV SOLN: 250.0000 mL | INTRAVENOUS | Status: DC | PRN

## 2011-12-16 MED ORDER — INSULIN ASPART 100 UNIT/ML ~~LOC~~ SOLN
0.0000 [IU] | Freq: Three times a day (TID) | SUBCUTANEOUS | Status: DC
  Administered 2011-12-17: 2 [IU] via SUBCUTANEOUS

## 2011-12-16 MED ORDER — GADOBENATE DIMEGLUMINE 529 MG/ML IV SOLN
14.0000 mL | Freq: Once | INTRAVENOUS | Status: AC
  Administered 2011-12-16: 14 mL via INTRAVENOUS

## 2011-12-16 MED ORDER — GADOBENATE DIMEGLUMINE 529 MG/ML IV SOLN: 14.0000 mL | Freq: Once | INTRAVENOUS | Status: DC

## 2011-12-16 MED ORDER — FLUTICASONE-SALMETEROL 250-50 MCG/DOSE IN AEPB
1.0000 | INHALATION_SPRAY | Freq: Two times a day (BID) | RESPIRATORY_TRACT | Status: DC
  Administered 2011-12-17 – 2011-12-22 (×10): 1 via RESPIRATORY_TRACT

## 2011-12-16 MED ORDER — CARVEDILOL 6.25 MG PO TABS
6.2500 mg | ORAL_TABLET | Freq: Two times a day (BID) | ORAL | Status: DC
  Administered 2011-12-17 – 2011-12-22 (×11): 6.25 mg via ORAL
  Filled 2011-12-16 (×14): qty 1

## 2011-12-16 MED ORDER — HEPARIN SODIUM (PORCINE) 5000 UNIT/ML IJ SOLN
5000.0000 [IU] | Freq: Three times a day (TID) | INTRAMUSCULAR | Status: DC
  Administered 2011-12-16 – 2011-12-22 (×13): 5000 [IU] via SUBCUTANEOUS
  Filled 2011-12-16 (×20): qty 1

## 2011-12-16 MED ORDER — FLUTICASONE-SALMETEROL 250-50 MCG/DOSE IN AEPB
2.0000 | INHALATION_SPRAY | Freq: Two times a day (BID) | RESPIRATORY_TRACT | Status: DC
  Filled 2011-12-16: qty 14

## 2011-12-16 MED ORDER — VITAMIN B-12 1000 MCG PO TABS
1000.0000 ug | ORAL_TABLET | Freq: Every day | ORAL | Status: DC
  Administered 2011-12-17 – 2011-12-22 (×6): 1000 ug via ORAL
  Filled 2011-12-16 (×7): qty 1

## 2011-12-16 MED ORDER — ASPIRIN EC 81 MG PO TBEC
81.0000 mg | DELAYED_RELEASE_TABLET | Freq: Every day | ORAL | Status: DC
  Administered 2011-12-16 – 2011-12-22 (×7): 81 mg via ORAL
  Filled 2011-12-16 (×7): qty 1

## 2011-12-16 NOTE — ED Notes (Signed)
Patient resting comfortably on stretcher family member at beside.

## 2011-12-16 NOTE — ED Notes (Signed)
Food tray given to patient 

## 2011-12-16 NOTE — Telephone Encounter (Signed)
No answer, no voice mail--will return call again later. Sounds like she may need inpt hospitalization---medical vs psychiatric--also consider depo form antipsychotic. Maria Gaines

## 2011-12-16 NOTE — Telephone Encounter (Signed)
Is not sure what to do.  He can't get her to eat so he can give her her meds - BS is 52.  She is refusing to come to appt today and he is asking that Dr [name] call her to "talk some sense into her"

## 2011-12-16 NOTE — Telephone Encounter (Signed)
I saw her in ED and we admitted her to our service.

## 2011-12-16 NOTE — ED Notes (Signed)
[id number] Ready

## 2011-12-16 NOTE — H&P (Signed)
[facility] Attending Admission Note: Maria Levy MD [phone] pager office [phone] I  have seen and examined this patient, reviewed their chart. I have discussed this patient with the residents Drs. Hairford and McGill. I agree with the resident's findings, assessment and care plan. Maria Gaines had an appointment scheduled with me today --her husband called and said she refused to come. i am glad he went ahead and brought her to the Ed. She is significantly altered. The neuro exam in Dr Algis Downs note is excellent--we did teh exam together with Dr Fara Boros. This is a challenging diagnosis---I suspect the etiology will ultimately be psychiatric but I agree with a very complete work up for other etiologies. We will repeat the MRI brain with contrast. Lewy body dementia is in differential. I think an LP must be considered although there is no indication of infectious cause.  I have spoken at length with her husband, Maria Gaines. He is in agreement that she may need inpatient psychiatric evaluation in the near future. She was seen by psych at last hospitalization---we are consulting them again as she has significantly deteriorated, despite appropriate mood stabilization therapy.

## 2011-12-16 NOTE — ED Notes (Signed)
Patient transported to MRI 

## 2011-12-16 NOTE — ED Provider Notes (Signed)
History     CSN: [id number]  Arrival date & time 12/16/11  1101   First MD Initiated Contact with Patient 12/16/11 1102      Chief Complaint  Patient presents with  . Altered Mental Status    (Consider location/radiation/quality/duration/timing/severity/associated sxs/prior treatment) HPI  Patient presents to the emergency department by EMS from her home where EMS was called to scene by her husband with complaint of altered mental status. Per EMS, when they arrived on scene the patient was altered and did not have her oxygen on. Patient is supposed to be on daily home oxygen, 4 L. EMS reports that oxygen tubing was clogged with condensation. They state that they changed out the tubing and replaced patient's oxygen. Patient had a pulse ox in the 70s prior to changing tubing however quickly corrected to the mid 90s and EMS reports that her mental status rapidly improved with oxygenation. They bring patient to ER stating that her vital signs and oxygenation have remained stable and normal throughout transportation. Patient is lying comfortably in bed. She is unsure whether or not her family will be arriving to ER. Patient has no complaints. Patient is unsure why she is in ER. A level V caveat applies due to patient's dementia. Patient was recently admitted to the hospital due to rapidly progressing dementia and altered mental status with d/c summary making remark to changes in medications with improvement in mentation, stopping SSRI's and beginning seroquel at night. Patient was supposed to be followed up by her primary care provider today however patient states "I just didn't want to go." Patient has no physical complaints at this time.  Past Medical History  Diagnosis Date  . Visual field defect 03/09/11    Visual field defect right eye. Dr Cammy Copa optomotrist. plans rechck 6 m  . Hypertension   . Diabetes mellitus   . COPD (chronic obstructive pulmonary disease)   . Breast cancer 2003  .  Vertigo   . Shortness of breath   . Asthma     per patient  . GERD (gastroesophageal reflux disease)     Past Surgical History  Procedure Date  . Tubal ligation   . Coronary angioplasty with stent placement 11/30/2003     [name], M.D, drug eluting stent     History reviewed. No pertinent family history.  History  Substance Use Topics  . Smoking status: Former Smoker -- 1.5 packs/day for 50 years    Types: Cigarettes    Quit date: 08/31/2000  . Smokeless tobacco: Never Used  . Alcohol Use: No    OB History    Grav Para Term Preterm Abortions TAB SAB Ect Mult Living                  Review of Systems  Unable to perform ROS   Allergies  Sertraline; Codeine; and Zolpidem tartrate  Home Medications   Current Outpatient Rx  Name Route Sig Dispense Refill  . ACETAMINOPHEN 500 MG PO TABS Oral Take 1,000 mg by mouth every 6 (six) hours as needed. For pain    . ASPIRIN 81 MG PO TABS Oral Take 81 mg by mouth daily.      . ATORVASTATIN CALCIUM 80 MG PO TABS  TAKE 1 TABLET (80 MG TOTAL) BY MOUTH AT BEDTIME. 30 tablet 9    REQUESTING #90  . BENAZEPRIL HCL 5 MG PO TABS Oral Take 1 tablet (5 mg total) by mouth daily. 30 tablet 11  . CARVEDILOL 6.25  MG PO TABS Oral Take 6.25 mg by mouth 2 (two) times daily with a meal.     . CLOPIDOGREL BISULFATE 75 MG PO TABS Oral Take 75 mg by mouth daily.      Marland Kitchen DILTIAZEM HCL ER COATED BEADS 180 MG PO CP24 Oral Take 1 capsule (180 mg total) by mouth daily. 90 capsule 3  . FLUTICASONE-SALMETEROL 250-50 MCG/DOSE IN AEPB Inhalation Inhale 2 puffs into the lungs 2 (two) times daily.     Marland Kitchen GABAPENTIN 300 MG PO CAPS Oral Take 300 mg by mouth 2 (two) times daily.     Marland Kitchen KLOR-CON 10 10 MEQ PO TBCR  TAKE 1 TABLET BY MOUTH TWICE A DAY 180 tablet 4  . LORAZEPAM 2 MG PO TABS Oral Take 0.5 tablets (1 mg total) by mouth 3 (three) times daily. 90 tablet 5    1 mg tab twice a day and 2 mg before bed.  . MECLIZINE HCL 25 MG PO TABS  TAKE 1 TABLET THREE  TIMES A DAY 30 tablet 0  . METFORMIN HCL 1000 MG PO TABS  TAKE 1 TABLET (1,000 MG TOTAL) BY MOUTH 2 (TWO) TIMES DAILY WITH A MEAL. 60 tablet 4    REQUESTING #180  . OMEPRAZOLE 40 MG PO CPDR  TAKE ONE CAPSULE BY MOUTH EVERY DAY 90 capsule 3  . QUETIAPINE FUMARATE 25 MG PO TABS Oral Take 1 tablet (25 mg total) by mouth at bedtime. 30 tablet 2    BP 149/82  Pulse 106  Temp(Src) 98.2 F (36.8 C) (Oral)  Resp 20  SpO2 95%  Physical Exam  Nursing note and vitals reviewed. Constitutional: She is oriented to person, place, and time. She appears well-developed and well-nourished. No distress.  HENT:  Head: Normocephalic and atraumatic.  Eyes: Conjunctivae and EOM are normal. Pupils are equal, round, and reactive to light.  Neck: Normal range of motion. Neck supple.  Cardiovascular: Normal rate, regular rhythm, normal heart sounds and intact distal pulses.  Exam reveals no gallop and no friction rub.   No murmur heard. Pulmonary/Chest: Effort normal and breath sounds normal. No respiratory distress. She has no wheezes. She has no rales. She exhibits no tenderness.  Abdominal: Soft. Bowel sounds are normal. She exhibits no distension and no mass. There is no tenderness. There is no rebound and no guarding.  Musculoskeletal: Normal range of motion. She exhibits no edema and no tenderness.  Neurological: She is alert and oriented to person, place, and time. No cranial nerve deficit.       Alert and oriented to self, place and time. Unsure of date.   Skin: Skin is warm and dry. No rash noted. She is not diaphoretic. No erythema.  Psychiatric: She has a normal mood and affect.    ED Course  Procedures (including critical care time)  Patient is agreeable to drinking fluids and eating applesauce. She continues to have no complaint. Patient's husband is now bedside and state that he is very concerned that patient is not functional at home. He states that she's very argumentative and at times  combative and refuses to eat and drink. She refuses take her medication. Today she refused to wear oxygen and would not leave the house to go see her primary care provider for her regular followup appointment. He is concerned that he is unable to care for the patient at home because of her agitation and combativeness. I spoke with family practice Center who will come to the ER to evaluate  the patient to construct a better treatment plan per patient. Disposition pending their evaluation.  Case discussed with Dr. Bebe Shaggy who is agreeable to treatment plan.   Date: 12/16/2011  Rate: 104  Rhythm: sinus tachycardia  QRS Axis: right  Intervals: normal  ST/T Wave abnormalities: nonspecific T wave changes  Conduction Disutrbances:none  Narrative Interpretation: non provocative compared to December 05, 2011  Old EKG Reviewed: unchanged   Labs Reviewed  CBC - Abnormal; Notable for the following:    WBC 11.1 (*)    All other components within normal limits  BASIC METABOLIC PANEL - Abnormal; Notable for the following:    Sodium 134 (*)    Chloride 92 (*)    Glucose, Bld 164 (*)    Creatinine, Ser 0.36 (*)    All other components within normal limits  URINALYSIS, ROUTINE W REFLEX MICROSCOPIC - Abnormal; Notable for the following:    Glucose, UA 500 (*)    Bilirubin Urine SMALL (*)    Ketones, ur 40 (*)    Protein, ur >300 (*)    All other components within normal limits  URINE MICROSCOPIC-ADD ON   Dg Chest 2 View  12/16/2011  *RADIOLOGY REPORT*  Clinical Data: Hypoxia.  Altered mental status.  CHEST - 2 VIEW  Comparison: 12/07/2011  Findings: There is hyperinflation of the lungs compatible with COPD.  Heart is borderline enlarged.  No confluent opacities, effusions or edema.  No acute bony abnormality.  IMPRESSION: Mild cardiomegaly, COPD.  Original Report Authenticated By: Cyndie Chime, M.D.     1. Dementia   2. Dehydration       MDM  [facility] to admit patient to their service. Attending  Dr. Burnard Leigh. VSS. Oxygenation good on 4L [place].         [name], [place] 12/16/11 1549

## 2011-12-16 NOTE — ED Provider Notes (Signed)
Pt seen with PA Here for AMS,now improved She is awake/alert, eating meal Awaiting family medicine evaluation BP 151/89  Pulse 93  Temp(Src) 97.9 F (36.6 C) (Oral)  Resp 18  SpO2 99%   Joya Gaskins, MD 12/16/11 1356

## 2011-12-16 NOTE — ED Notes (Addendum)
EMS-EMS called out for altered mental status. Pts O2 sats 70%, pt had oxygen via [place] but tubing was clogged with condensation. Nasal canula switched out and O2 sats into the 90s. Husband at scene states pt was more to her norm once oxygen tubing was replaced. Pt has hx of copd and chf. EMS reports noncompliance issues with O2 at home may be going on. Pt denies sob or pain at this time, pt report she is to wear 2L of O2 via [place] at all times.

## 2011-12-16 NOTE — ED Notes (Signed)
Family medicine Doctor to see patient in ED.  Husband at bedside.  Verbalized understanding.

## 2011-12-16 NOTE — H&P (Signed)
Maria Gaines is an 61 y.o. female.   History and Physical Note Family Medicine Teaching Service Maria M. Hairford, MD Service Pager: [phone]  Chief Complaint: Altered mental status HPI: Patient is a 61 yo F with PMH of bipolar disorder, DM, HTN, COPD who was brought to ED via EMS at request of her husband who lives with her. He states she has "good times and bad times." These issues began about 2 years ago, prior to that she had episodes 2-3 times per year which quickly resolved. Patient was recently admitted to the hospital and her medications were changed from SSRI to Seroquel. Since discharge from that hospitalization, patient began acting differently. She was doing well a few days after discharge, then she began doing poorly. Patient's husband states he "woke up to a woman he didn't know" this morning. She refused to wear her oxygen, take medications, leave the house, go to her doctor's appointments. Today, patient took her oxygen off and went to the bathroom. Husband states she was in there for at least 30 minutes. He checked her CBG at that time and it was in the 50's. He gave her something to eat and attempted to give her all daily medications. Of note, patient did take Seroquel last night. Since she refused her oxygen and was not acting herself, EMS was called. When they arrived to the house, her O2 sat was in the 70's. They convinced her to put O2 on, and her sats came to 90%. Patient was brought to ED for evaluation.   Dr. Jennette Gaines had attempted to contact patient earlier today and was concerned. Dr. Jennette Gaines came to ED to speak with husband and evaluate patient. Patient states she does not remember today. She denies visual or auditory hallucinations. She states she feels confused and anxious. Patient is talking, although she refused speaking earlier today. Patient's husband states she has never been this bad before. He also made mention that patient has a daily headache.  She was initially  diagnosed with bipolar many years ago. Never been in a behavior health facility. Mental disorders run in her family, mother and uncle.   Past Medical History  Diagnosis Date  . Visual field defect 03/09/11    Visual field defect right eye. Dr Cammy Copa optomotrist. plans rechck 6 m  . Hypertension   . Diabetes mellitus   . COPD (chronic obstructive pulmonary disease)   . Breast cancer 2003  . Vertigo   . Shortness of breath   . Asthma     per patient  . GERD (gastroesophageal reflux disease)     Past Surgical History  Procedure Date  . Tubal ligation   . Coronary angioplasty with stent placement 11/30/2003     [name], M.D, drug eluting stent     History reviewed. No pertinent family history. Social History:  reports that she quit smoking about 11 years ago. Her smoking use included Cigarettes. She has a 75 pack-year smoking history. She has never used smokeless tobacco. She reports that she does not drink alcohol or use illicit drugs.  Allergies:  Allergies  Allergen Reactions  . Sertraline     Psychosis/mania  . Codeine     "unclear of where i was at and who anyone was"  . Zolpidem Tartrate     REACTION: sleepwalking    Medications Prior to Admission  Medication Dose Route Frequency Provider Last Rate Last Dose  . sodium chloride 0.9 % bolus 500 mL  500 mL Intravenous Once  [name], PA   500 mL at 12/16/11 1322   Medications Prior to Admission  Medication Sig Dispense Refill  . acetaminophen (TYLENOL) 500 MG tablet Take 1,000 mg by mouth every 6 (six) hours as needed. For pain      . aspirin 81 MG tablet Take 81 mg by mouth daily.        Marland Kitchen atorvastatin (LIPITOR) 80 MG tablet TAKE 1 TABLET (80 MG TOTAL) BY MOUTH AT BEDTIME.  30 tablet  9  . benazepril (LOTENSIN) 5 MG tablet Take 1 tablet (5 mg total) by mouth daily.  30 tablet  11  . carvedilol (COREG) 6.25 MG tablet Take 6.25 mg by mouth 2 (two) times daily with a meal.       . clopidogrel (PLAVIX) 75 MG  tablet Take 75 mg by mouth daily.        Marland Kitchen diltiazem (CARTIA XT) 180 MG 24 hr capsule Take 1 capsule (180 mg total) by mouth daily.  90 capsule  3  . Fluticasone-Salmeterol (ADVAIR DISKUS) 250-50 MCG/DOSE AEPB Inhale 2 puffs into the lungs 2 (two) times daily.       Marland Kitchen gabapentin (NEURONTIN) 300 MG capsule Take 300 mg by mouth 2 (two) times daily.       Marland Kitchen KLOR-CON 10 10 MEQ tablet TAKE 1 TABLET BY MOUTH TWICE A DAY  180 tablet  4  . LORazepam (ATIVAN) 2 MG tablet Take 0.5 tablets (1 mg total) by mouth 3 (three) times daily.  90 tablet  5  . meclizine (ANTIVERT) 25 MG tablet TAKE 1 TABLET THREE TIMES A DAY  30 tablet  0  . metFORMIN (GLUCOPHAGE) 1000 MG tablet TAKE 1 TABLET (1,000 MG TOTAL) BY MOUTH 2 (TWO) TIMES DAILY WITH A MEAL.  60 tablet  4  . omeprazole (PRILOSEC) 40 MG capsule TAKE ONE CAPSULE BY MOUTH EVERY DAY  90 capsule  3  . QUEtiapine (SEROQUEL) 25 MG tablet Take 1 tablet (25 mg total) by mouth at bedtime.  30 tablet  2  . DISCONTD: ATROVENT HFA 17 MCG/ACT inhaler Inhale 1 puff into the lungs 2 (two) times daily.         Results for orders placed during the hospital encounter of 12/16/11 (from the past 48 hour(s))  CBC     Status: Abnormal   Collection Time   12/16/11 11:18 AM      Component Value Range Comment   WBC 11.1 (*) 4.0 - 10.5 (K/uL)    RBC 4.40  3.87 - 5.11 (MIL/uL)    Hemoglobin 12.9  12.0 - 15.0 (g/dL)    HCT 21.3  08.6 - 57.8 (%)    MCV 91.6  78.0 - 100.0 (fL)    MCH 29.3  26.0 - 34.0 (pg)    MCHC 32.0  30.0 - 36.0 (g/dL)    RDW 46.9  62.9 - 52.8 (%)    Platelets 320  150 - 400 (K/uL)   BASIC METABOLIC PANEL     Status: Abnormal   Collection Time   12/16/11 11:18 AM      Component Value Range Comment   Sodium 134 (*) 135 - 145 (mEq/L)    Potassium 4.2  3.5 - 5.1 (mEq/L)    Chloride 92 (*) 96 - 112 (mEq/L)    CO2 30  19 - 32 (mEq/L)    Glucose, Bld 164 (*) 70 - 99 (mg/dL)    BUN 8  6 - 23 (mg/dL)    Creatinine, Ser  0.36 (*) 0.50 - 1.10 (mg/dL)    Calcium  9.9  8.4 - 10.5 (mg/dL)    GFR calc non Af Amer >90  >90 (mL/min)    GFR calc Af Amer >90  >90 (mL/min)   URINALYSIS, ROUTINE W REFLEX MICROSCOPIC     Status: Abnormal   Collection Time   12/16/11 12:14 PM      Component Value Range Comment   Color, Urine YELLOW  YELLOW     APPearance CLEAR  CLEAR     Specific Gravity, Urine 1.026  1.005 - 1.030     pH 7.0  5.0 - 8.0     Glucose, UA 500 (*) NEGATIVE (mg/dL)    Hgb urine dipstick NEGATIVE  NEGATIVE     Bilirubin Urine SMALL (*) NEGATIVE     Ketones, ur 40 (*) NEGATIVE (mg/dL)    Protein, ur >629 (*) NEGATIVE (mg/dL)    Urobilinogen, UA 1.0  0.0 - 1.0 (mg/dL)    Nitrite NEGATIVE  NEGATIVE     Leukocytes, UA NEGATIVE  NEGATIVE    URINE MICROSCOPIC-ADD ON     Status: Normal   Collection Time   12/16/11 12:14 PM      Component Value Range Comment   Squamous Epithelial / LPF RARE  RARE     WBC, UA 0-2  <3 (WBC/hpf)    RBC / HPF 0-2  <3 (RBC/hpf)    Bacteria, UA RARE  RARE     Dg Chest 2 View  12/16/2011  *RADIOLOGY REPORT*  Clinical Data: Hypoxia.  Altered mental status.  CHEST - 2 VIEW  Comparison: 12/07/2011  Findings: There is hyperinflation of the lungs compatible with COPD.  Heart is borderline enlarged.  No confluent opacities, effusions or edema.  No acute bony abnormality.  IMPRESSION: Mild cardiomegaly, COPD.  Original Report Authenticated By: Cyndie Chime, M.D.    Review of Systems  Constitutional: Negative for fever and chills.  Eyes: Negative for blurred vision.  Cardiovascular: Negative for chest pain.  Gastrointestinal: Negative for vomiting.  Musculoskeletal: Negative for falls.  Skin: Negative for rash.  Neurological: Negative for focal weakness and headaches.  Psychiatric/Behavioral: Negative for hallucinations. The patient is nervous/anxious.     Blood pressure 151/89, pulse 93, temperature 97.9 F (36.6 C), temperature source Oral, resp. rate 18, SpO2 99.00%. Physical Exam  Constitutional: She appears  well-developed and well-nourished. No distress.  HENT:  Head: Normocephalic and atraumatic.       [place] in place  Eyes: Pupils are equal, round, and reactive to light.       Could not follow commands to evaluate extraocular movements but appears grossly normal  Neck: Neck supple.  Cardiovascular: Normal rate, regular rhythm, normal heart sounds and intact distal pulses.   Respiratory: Effort normal. No stridor. No respiratory distress. She has no wheezes.  GI: Soft. Bowel sounds are normal. She exhibits no distension. There is no tenderness.  Musculoskeletal: Normal range of motion. She exhibits no edema.  Neurological: She is disoriented. No cranial nerve deficit. She displays a negative Romberg sign.       Patient was obviously distracted (?internal stimulus) Unable to follow commands therefore unable to do full neuro exam, however, CN appear grossly normal, no focal weakness or deficit. Unable to assess cerebellar function.   Psychiatric: Her mood appears anxious. Her speech is delayed. She is agitated, slowed, withdrawn and actively hallucinating. She is is not hyperactive and not combative. Thought content is paranoid. She expresses inappropriate judgment. She  does not express impulsivity. She expresses no homicidal and no suicidal ideation. She exhibits abnormal recent memory. She is inattentive.     Assessment/Plan 61 yo F with PMH of bipolar disorder, HTN, DM and COPD presenting for altered mental status  1. Psych- Patient confused above baseline. Per Dr. Jennette Gaines and patient's husband, she has never had this much change in her mental status. Patient has history of Bipolar Disorder, therefore we must keep a mental health issue in mind as we work up her altered mental status. With that said, we must also investigate other causes of her altered mental status. Patient had a fairly thorough work-up 10 days ago, but she has continued to worsen. - Repeat Head CT did not show any acute changes. Will  order brain MRI with contrast - Will check Free T3/Free T4. (TSH normal 1.145 one month ago) - Check ammonia level - Will get UDS - Heavy Metal screen done on December 07, 2011. Results pending in Epic, although per [facility], her report showed total heavy metals were below the detectable limit. - Patient had a slightly elevated Methylmalonic Acid level to 0.55 (normal <0.4) B12 was within normal range but borderline low, we will supplement her with additional B12 - Will get Blood cultures. Patient is not febrile at this time, and does not have elevate WBC. - UA within normal limits. Had glucose and protein, but nitrite negative. Will not treat empirically but will send for culture - Will check RPR. Last RPR was last year and non-reactive - Will consider LP if patient does not improve given the persistent decline in mental status and daily headaches - Will continue Seroquel qhs. Can given Haldol if needed for agitation. Patient will have sitter at bedside. Can order soft restraints, if needed.  2. HTN- BP stable at admission. She will be monitored on telemetry. - Continue home meds- Diltiazem, Coreg and ACE-I - Known to have EF <50%. Will monitor closely and not fluid overload.  3. DM- Hemoglobin A1C 6.3 a few months ago. She does have glucose in her urine, but patient's husband states she had low glucose at home. Will need close monitoring. - CBG four times daily - SSI sensitive. If needed, will increase scale  4. COPD- Patient on chronic O2 at home. Seems to have increased O2 requirement. CXR negative except chronic changes.  - Continue supplemental oxygen - Continue home Advair - Will consider repeat CXR tomorrow if patient has any changes in respiratory status  5. GERD- History of GERD. - Continue PPI  6. H/O Breast cancer- Breast exam done by Dr. Jennette Gaines for new masses or palpable changes was negative.  - No further work up at this time  7. H/O coronary stent- Right coronary artery stent.    - Continue Plavix and ASA  8. FEN/GI- IVF to [facility]. Carb modified diet  9. Ppx- Heparin SQ. Protonix 40mg  BID  10. Dispo- Pending work up and clinical improvement.  If medically cleared with exams listed above, will consider psych consult for evaluation and possible transfer to behavioral health for inpatient care.  Contact Numbers: Collier Flowers daughter [phone] [name], husband [phone]   Gaines, Maria 12/16/2011, 3:14 PM   UPPER LEVEL ADDENDUM I have seen and examined Ms. Colpitts  with Dr. Mikel Cella and I agree with the above assessment/plan. I have reviewed all available data and have made any necessary changes to the above H&P.  Arnelle Nale 12/16/2011, 6:16 PM

## 2011-12-16 NOTE — ED Notes (Signed)
Family member called to give information about pts medications.  Glena Norfolk [phone]

## 2011-12-16 NOTE — ED Notes (Signed)
Pt. Back from CT. Resting comfortably.

## 2011-12-16 NOTE — ED Notes (Signed)
Family medicine at bedside.

## 2011-12-17 ENCOUNTER — Inpatient Hospital Stay (HOSPITAL_COMMUNITY): Payer: Medicare Other

## 2011-12-17 LAB — CBC
Hemoglobin: 11.8 g/dL — ABNORMAL LOW (ref 12.0–15.0)
MCV: 92.3 fL (ref 78.0–100.0)
Platelets: 278 10*3/uL (ref 150–400)
RBC: 4.03 MIL/uL (ref 3.87–5.11)
WBC: 8.5 10*3/uL (ref 4.0–10.5)

## 2011-12-17 LAB — RPR: RPR Ser Ql: NONREACTIVE

## 2011-12-17 LAB — COMPREHENSIVE METABOLIC PANEL
Albumin: 3.3 g/dL — ABNORMAL LOW (ref 3.5–5.2)
BUN: 6 mg/dL (ref 6–23)
Calcium: 9.9 mg/dL (ref 8.4–10.5)
Chloride: 95 mEq/L — ABNORMAL LOW (ref 96–112)
Creatinine, Ser: 0.43 mg/dL — ABNORMAL LOW (ref 0.50–1.10)
GFR calc non Af Amer: >90 mL/min (ref >90)
Total Bilirubin: 0.3 mg/dL (ref 0.3–1.2)

## 2011-12-17 LAB — GLUCOSE, CAPILLARY
Glucose-Capillary: 162 mg/dL — ABNORMAL HIGH (ref 70–99)
Glucose-Capillary: 163 mg/dL — ABNORMAL HIGH (ref 70–99)

## 2011-12-17 LAB — URINE DRUGS OF ABUSE SCREEN W ALC, ROUTINE (REF LAB)
Amphetamine Screen, Ur: NEGATIVE
Cocaine Metabolites: NEGATIVE
Creatinine,U: 118 mg/dL
Marijuana Metabolite: NEGATIVE
Opiate Screen, Urine: NEGATIVE

## 2011-12-17 LAB — URINE CULTURE

## 2011-12-17 LAB — T4, FREE: Free T4: 0.87 ng/dL (ref 0.80–1.80)

## 2011-12-17 MED ORDER — LORAZEPAM 1 MG PO TABS
1.0000 mg | ORAL_TABLET | ORAL | Status: DC | PRN
  Administered 2011-12-17 – 2011-12-21 (×5): 1 mg via ORAL
  Filled 2011-12-17 (×5): qty 1

## 2011-12-17 MED ORDER — QUETIAPINE FUMARATE 25 MG PO TABS
25.0000 mg | ORAL_TABLET | Freq: Four times a day (QID) | ORAL | Status: DC | PRN
  Filled 2011-12-17: qty 1

## 2011-12-17 MED ORDER — INSULIN ASPART 100 UNIT/ML ~~LOC~~ SOLN
0.0000 [IU] | Freq: Three times a day (TID) | SUBCUTANEOUS | Status: DC
  Administered 2011-12-17 (×2): 3 [IU] via SUBCUTANEOUS
  Administered 2011-12-18: 2 [IU] via SUBCUTANEOUS
  Administered 2011-12-18 – 2011-12-21 (×11): 3 [IU] via SUBCUTANEOUS
  Administered 2011-12-22: 5 [IU] via SUBCUTANEOUS
  Administered 2011-12-22: 3 [IU] via SUBCUTANEOUS

## 2011-12-17 MED ORDER — ALUM & MAG HYDROXIDE-SIMETH 200-200-20 MG/5ML PO SUSP
30.0000 mL | ORAL | Status: DC | PRN
  Administered 2011-12-17: 30 mL via ORAL
  Filled 2011-12-17: qty 30

## 2011-12-17 NOTE — Progress Notes (Signed)
PGY-1 Daily Progress Note Family Medicine Teaching Service Maria Gaines M. Maria Maxon, MD Service Pager: [phone]  Subjective: Patient had no acute events overnight. Nurse states she does not fully participate in neuro checks, and the exam varies from each check. Otherwise, no concerns.   Objective: Vital signs in last 24 hours: Temp:  [97.1 F (36.2 C)-98.9 F (37.2 C)] 98.7 F (37.1 C) (04/18 0800) Pulse Rate:  [80-106] 80  (04/18 0800) Resp:  [16-26] 18  (04/18 0800) BP: (105-169)/(64-95) 152/95 mmHg (04/18 0944) SpO2:  [92 %-100 %] 95 % (04/18 0800) Weight:  [153 lb 4.8 oz (69.536 kg)-155 lb 6.4 oz (70.489 kg)] 155 lb 6.4 oz (70.489 kg) (04/18 0412) Weight change:     Intake/Output from previous day:   Intake/Output this shift: Total I/O In: 3 [I.V.:3] Out: -  Physical Exam  Constitutional: She appears well-developed and well-nourished. No distress. Sitting up in bed with breakfast tray. Head: Normocephalic and atraumatic. Enhaut in place at 2L Neck: Neck supple.  Cardiovascular: Normal rate, regular rhythm, normal heart sounds and intact distal pulses.  Respiratory: Effort normal. No stridor. No respiratory distress. She has no wheezes.  GI: Soft. Bowel sounds are normal. She exhibits no distension. There is no tenderness.  Musculoskeletal: She exhibits no edema.  Neurological: She is disoriented- oriented to person only. Repeats the same answer for all other questions. Patient was distracted (?internal stimulus) Unable to follow commands therefore unable to do full neuro exam, however, CN appear grossly normal, no focal weakness or deficit. Unable to assess cerebellar function, she will touch her nose but does not touch finger due to being distracted. Psychiatric: Her mood appears normal. No anxiety. Her speech is somewhat delayed although more responsive than on admission. She appears to be actively hallucinating.  She is is not hyperactive and not combative. She is inattentive  Lab  Results:  Basename 12/17/11 0605 12/16/11 1118  WBC 8.5 11.1*  HGB 11.8* 12.9  HCT 37.2 40.3  PLT 278 320   BMET  Basename 12/17/11 0605 12/16/11 1118  NA 139 134*  K 4.2 4.2  CL 95* 92*  CO2 37* 30  GLUCOSE 144* 164*  BUN 6 8  CREATININE 0.43* 0.36*  CALCIUM 9.9 9.9    Studies/Results: Dg Chest 2 View  12/16/2011  *RADIOLOGY REPORT*  Clinical Data: Hypoxia.  Altered mental status.  CHEST - 2 VIEW  Comparison: 12/07/2011  Findings: There is hyperinflation of the lungs compatible with COPD.  Heart is borderline enlarged.  No confluent opacities, effusions or edema.  No acute bony abnormality.  IMPRESSION: Mild cardiomegaly, COPD.  Original Report Authenticated By: Cyndie Chime, M.D.   Ct Head Wo Contrast  12/16/2011  *RADIOLOGY REPORT*  Clinical Data: Altered mental status.  CT HEAD WITHOUT CONTRAST  Technique:  Contiguous axial images were obtained from the base of the skull through the vertex without contrast.  Comparison: 12/05/2011  Findings: Mild chronic small vessel disease changes throughout the deep white matter. No acute intracranial abnormality. Specifically, no hemorrhage, hydrocephalus, mass lesion, acute infarction, or significant intracranial injury.  No acute calvarial abnormality.  IMPRESSION: No acute intracranial abnormality.  Mild chronic small vessel disease.  Original Report Authenticated By: Cyndie Chime, M.D.   Mr Laqueta Jean Wo Contrast  12/17/2011  *RADIOLOGY REPORT*  Clinical Data: 61 year old female with altered mental status, confusion. History of breast cancer.  MRI HEAD WITHOUT AND WITH CONTRAST  Technique:  Multiplanar, multiecho pulse sequences of the brain and surrounding structures were  obtained according to standard protocol without and with intravenous contrast  Contrast: 14mL MULTIHANCE GADOBENATE DIMEGLUMINE 529 MG/ML IV SOLN  Comparison: head CTs 12/16/2011 and earlier.  Brain MRI 11/12/2011.  Findings: No restricted diffusion to suggest acute  infarction.  No midline shift, mass effect, evidence of mass lesion, ventriculomegaly, extra-axial collection or acute intracranial hemorrhage.  Cervicomedullary junction is within normal limits. Major intracranial vascular flow voids are preserved.  Stable gray and white matter signal with nonspecific cerebral white matter T2 and FLAIR hyperintensity. No abnormal enhancement identified. Negative visualized cervical spine. Visualized bone marrow signal is within normal limits.  Visualized orbit soft tissues are within normal limits.  Left maxillary sinus mucous retention cyst.  Sinuses and mastoids are unchanged.  Unchanged fluid signal near the left anterior temporal tip, may be chronic fluid in an accessory pneumatized sphenoid bone sinus.   Negative scalp soft tissues.  IMPRESSION: No acute or metastatic intracranial abnormality.   Stable MRI appearance of the brain.  Original Report Authenticated By: Harley Hallmark, M.D.    Medications:  I have reviewed the patient's current medications. Scheduled:    . aspirin EC  81 mg Oral Daily  . benazepril  5 mg Oral Daily  . carvedilol  6.25 mg Oral BID WC  . clopidogrel  75 mg Oral Daily  . diltiazem  180 mg Oral Daily  . Fluticasone-Salmeterol  1 puff Inhalation BID  . gadobenate dimeglumine  14 mL Intravenous Once  . gadobenate dimeglumine  14 mL Intravenous Once  . heparin  5,000 Units Subcutaneous Q8H  . insulin aspart  0-9 Units Subcutaneous TID WC  . sodium chloride  500 mL Intravenous Once  . sodium chloride  3 mL Intravenous Q12H  . vitamin B-12  1,000 mcg Oral Daily  . DISCONTD: aspirin  81 mg Oral Daily  . DISCONTD: Fluticasone-Salmeterol  2 puff Inhalation BID  . DISCONTD: gabapentin  300 mg Oral BID  . DISCONTD: LORazepam  1 mg Oral TID  . DISCONTD: QUEtiapine  25 mg Oral QHS   Continuous:  ZOX:WRUEAV chloride, LORazepam, QUEtiapine, sodium chloride  Assessment/Plan: 61 yo F with PMH of bipolar disorder, HTN, DM and COPD  presenting for altered mental status   1. Psych- Patient confused above baseline. Per Dr. [name] and patient's husband, she has never had this much change in her mental status. Patient has history of Bipolar Disorder, therefore we must keep a mental health issue in mind as we work up her altered mental status. With that said, we must also investigate other causes of her altered mental status. Patient had a fairly thorough work-up 10 days ago, but she has continued to worsen. Patient admitted for further work up and observation. - Repeat Head CT did not show any acute changes, and had stable MRI. No sign of acute stroke. Patient is not able to fully participate in neuro checks and they offer very little benefit at this time, therefore will d/c neuro checks. - Will check Free T3/Free T4. (TSH normal 1.145 one month ago) These results are still pending - Ammonia level 26. LFT's within normal limits - UDS negative  - Heavy Metal screen done on December 07, 2011. Results pending in Epic, although per [facility], her report showed total heavy metals were below the detectable limit.  - Patient had a slightly elevated Methylmalonic Acid level to 0.55 (normal <0.4) B12 was within normal range but borderline low, we will supplement her with additional B12  - Blood cultures pending.  Patient is not febrile at this time, and does not have elevate WBC. No signs of active infections, but will consider LP if patient does not improve - UA within normal limits. Had glucose and protein, but nitrite negative. Will not treat empirically but will send for culture  - Last RPR was last year and non-reactive. Results pending for current RPR lab  - Will continue Seroquel prn. Can given Haldol if needed for agitation. - All sedating medications stopped including gabapentin and scheduled Ativan. Will keep Ativan prn.  2. HTN- BP stable at admission. She will be monitored on telemetry.  - Continue home meds- Diltiazem, Coreg and ACE-I    - Known to have EF <50%. Will monitor closely and not fluid overload.   3. DM- Hemoglobin A1C 6.3 a few months ago. She does have glucose in her urine, but patient's husband states she had low glucose at home. Will need close monitoring.  - CBG four times daily  - SSI sensitive. CBG's consistently high, will change to moderate scale  4. COPD- Patient on chronic O2 at home. Seemed to have increased O2 requirement on admission but now down to 2L which is her home flow rate. CXR negative except chronic changes.  - Continue supplemental oxygen  - Continue home Advair  - Will consider repeat CXR tomorrow if patient has any changes in respiratory status, but no indication at this time.   5. GERD- History of GERD.  - Continue PPI   6. H/O Breast cancer- Breast exam done by Dr. [name] for new masses or palpable changes was negative.  - No further work up at this time   7. H/O coronary stent- Right coronary artery stent.  - Continue Plavix and ASA   8. FEN/GI- IVF to [facility]. Carb modified diet  9. Ppx- Heparin SQ. Protonix 40mg  BID  10. Dispo- Pending work up and clinical improvement. If medically cleared with exams listed above, will consider psych consult for evaluation and possible transfer to behavioral health for inpatient care.   Contact Numbers:  Collier Flowers, daughter [phone]  [name], husband [phone]   LOS: 1 day   Maria Gaines 12/17/2011, 10:10 AM

## 2011-12-17 NOTE — Progress Notes (Signed)
Family Medicine Teaching Service Attending Note  I discussed patient Maria Gaines  with Dr. Mikel Cella  and reviewed their note for today.  I agree with their assessment and plan.     Consider neurology consult after EEG completed

## 2011-12-17 NOTE — ED Provider Notes (Signed)
Medical screening examination/treatment/procedure(s) were conducted as a shared visit with non-physician practitioner(s) and myself.  I personally evaluated the patient during the encounter  Pt is in no distress while in the ED Plan is to have FP admit patient Pt stable in the ED  Joya Gaskins, MD 12/17/11 [phone]

## 2011-12-17 NOTE — Progress Notes (Signed)
I agree with the assessments and medication admin done by Megan Roberts [employer] student from 7p-7a. 

## 2011-12-18 DIAGNOSIS — F0390 Unspecified dementia without behavioral disturbance: Secondary | ICD-10-CM

## 2011-12-18 LAB — BASIC METABOLIC PANEL
BUN: 5 mg/dL — ABNORMAL LOW (ref 6–23)
CO2: 38 mEq/L — ABNORMAL HIGH (ref 19–32)
Chloride: 94 mEq/L — ABNORMAL LOW (ref 96–112)
Glucose, Bld: 134 mg/dL — ABNORMAL HIGH (ref 70–99)
Potassium: 3.7 mEq/L (ref 3.5–5.1)

## 2011-12-18 LAB — CBC
HCT: 36.6 % (ref 36.0–46.0)
Hemoglobin: 11.7 g/dL — ABNORMAL LOW (ref 12.0–15.0)
MCHC: 32 g/dL (ref 30.0–36.0)
WBC: 7.1 10*3/uL (ref 4.0–10.5)

## 2011-12-18 LAB — GLUCOSE, CAPILLARY
Glucose-Capillary: 141 mg/dL — ABNORMAL HIGH (ref 70–99)
Glucose-Capillary: 182 mg/dL — ABNORMAL HIGH (ref 70–99)

## 2011-12-18 LAB — MAGNESIUM: Magnesium: 1.6 mg/dL (ref 1.5–2.5)

## 2011-12-18 MED ORDER — ALBUTEROL SULFATE HFA 108 (90 BASE) MCG/ACT IN AERS
2.0000 | INHALATION_SPRAY | RESPIRATORY_TRACT | Status: DC | PRN
  Administered 2011-12-18 – 2011-12-22 (×7): 2 via RESPIRATORY_TRACT
  Filled 2011-12-18 (×2): qty 6.7

## 2011-12-18 NOTE — Progress Notes (Signed)
[facility]MT[facility] Attending Daily Note: Maria Levy MD [phone] pager office [phone] I  have seen and examined this patient, reviewed their chart. I have discussed this patient with the resident. I agree with the resident's findings, assessment and care plan. Maria Gaines seems totally BACK to her baseline mental status today. She has little memory of events of last 24-48 hours. I am uncertain what the diagnosis is--given the EEG findings I am leaning toward an encephalopathy--I just do not know why she would be encephalopathic intermittently. Certainly Lewy Body dementia can have many forms but itis almost as if she had some other more acute type of process occurring. I am sure the psychiatric component of her bipolar disorder is confusing this issue a well. Would be interesting to see if a repeat EEG in her "baseline state" such as she is now would still show diffuse background slowing. I eagerly anticipate input from neurology which we have called as a fornmal consult.

## 2011-12-18 NOTE — Procedures (Signed)
EEG NUMBER:  HISTORY:  A 61 year old female with altered mental status.  MEDICATIONS:  Aspirin, Lotensin, Coreg, Plavix, Cardizem, heparin, insulin.  CONDITIONS OF RECORDING:  This is a 16-channel EEG carried out with the patient in the awake and drowsy states.  DESCRIPTION:  The waking background activity consists of a low-voltage, symmetrical, fairly well-organized 6 Hz theta activity seen from the parieto-occipital and posterotemporal regions.  Low-voltage, fast activity poorly organized is seen anteriorly.  This fast activity is not as prevalent as would be expected and actually there is more often noted theta rhythms seen.  The central and temporal regions are dominated by theta rhythms as well with only infrequent bouts of alpha noted.  The patient drowses with further predominance of theta rhythms.  Some rare sleep transients are noted.  Hypoventilation was not performed. Intermittent photic stimulation failed to elicit any change in the tracing.  IMPRESSION:  This is an abnormal EEG secondary to general background slowing.  This finding is consistent with a diffuse disturbance that is etiologically nonspecific, but may include a metabolic encephalopathy among other possibilities.  No epileptiform activity is noted.          ______________________________ Thana Farr, MD    EA:[id number] D:  12/17/2011 18:19:37  T:  12/17/2011 22:23:54  Job #:  [id number]
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
## 2011-12-18 NOTE — Consult Note (Signed)
[facility] CONSULT NOTE     Reason for Consult: confusion    HPI:    Maria Gaines is an 61 y.o. female much of the information was gathered from the chart. Per husband who lives with her. He states she has "good times and bad times." These issues began about 2 years ago, prior to that she had episodes 2-3 times per year which quickly resolved. Patient was recently admitted to the hospital and her medications were changed from SSRI to Seroquel. Since discharge from that hospitalization, patient began acting differently. She was doing well a few days after discharge, then she began doing poorly. Patient's husband states he "woke up to a woman he didn't know" .  When talking with the patient she states she does have some memory problems but feels it has not been occuring for 2 years.  She slightly defensive when asked about her memory issues.  Neurology was asked to give suggestions on dementia workup.   Past Medical History  Diagnosis Date  . Visual field defect 03/09/11    Visual field defect right eye. Dr Cammy Copa optomotrist. plans rechck 6 m  . Hypertension   . Diabetes mellitus   . COPD (chronic obstructive pulmonary disease)   . Breast cancer 2003  . Vertigo   . Shortness of breath   . Asthma     per patient  . GERD (gastroesophageal reflux disease)     Past Surgical History  Procedure Date  . Tubal ligation   . Coronary angioplasty with stent placement 11/30/2003     [name], M.D, drug eluting stent     History reviewed. No pertinent family history.  Social History:  reports that she quit smoking about 11 years ago. Her smoking use included Cigarettes. She has a 75 pack-year smoking history. She has never used smokeless tobacco. She reports that she does not drink alcohol or use illicit drugs.  Allergies  Allergen Reactions  . Sertraline     Psychosis/mania  . Codeine     "unclear of where i was at and who anyone was"  .  Zolpidem Tartrate     REACTION: sleepwalking    Medications:    Prior to Admission:  Prescriptions prior to admission  Medication Sig Dispense Refill  . acetaminophen (TYLENOL) 500 MG tablet Take 1,000 mg by mouth every 6 (six) hours as needed. For pain      . aspirin 81 MG tablet Take 81 mg by mouth daily.        Marland Kitchen atorvastatin (LIPITOR) 80 MG tablet Take 80 mg by mouth daily.      . benazepril (LOTENSIN) 5 MG tablet Take 1 tablet (5 mg total) by mouth daily.  30 tablet  11  . carvedilol (COREG) 6.25 MG tablet Take 6.25 mg by mouth 2 (two) times daily with a meal.       . clopidogrel (PLAVIX) 75 MG tablet Take 75 mg by mouth daily.        Marland Kitchen diltiazem (CARTIA XT) 180 MG 24 hr capsule Take 1 capsule (180 mg total) by mouth daily.  90 capsule  3  . Fluticasone-Salmeterol (ADVAIR) 250-50 MCG/DOSE AEPB Inhale 1 puff into the lungs every 12 (twelve) hours.      . gabapentin (NEURONTIN) 300 MG capsule Take 300 mg by mouth 2 (two) times daily.       Marland Kitchen  LORazepam (ATIVAN) 2 MG tablet Take 0.5 tablets (1 mg total) by mouth 3 (three) times daily.  90 tablet  5  . meclizine (ANTIVERT) 25 MG tablet Take 25 mg by mouth 3 (three) times daily.      . metFORMIN (GLUCOPHAGE) 1000 MG tablet Take 1,000 mg by mouth 2 (two) times daily with a meal.      . omeprazole (PRILOSEC) 40 MG capsule Take 40 mg by mouth daily.      . potassium chloride (K-DUR,KLOR-CON) 10 MEQ tablet Take 10 mEq by mouth daily.      . QUEtiapine (SEROQUEL) 25 MG tablet Take 1 tablet (25 mg total) by mouth at bedtime.  30 tablet  2   Scheduled:   . aspirin EC  81 mg Oral Daily  . benazepril  5 mg Oral Daily  . carvedilol  6.25 mg Oral BID WC  . clopidogrel  75 mg Oral Daily  . diltiazem  180 mg Oral Daily  . Fluticasone-Salmeterol  1 puff Inhalation BID  . gadobenate dimeglumine  14 mL Intravenous Once  . heparin  5,000 Units Subcutaneous Q8H  . insulin aspart  0-15 Units Subcutaneous TID WC  . sodium chloride  3 mL Intravenous  Q12H  . vitamin B-12  1,000 mcg Oral Daily    Review of Systems - General ROS: negative for - chills, fatigue, fever or hot flashes Hematological and Lymphatic ROS: negative for - bruising, fatigue, jaundice or pallor Endocrine ROS: negative for - hair pattern changes, hot flashes, mood swings or skin changes Respiratory ROS: negative for - cough, hemoptysis, orthopnea or wheezing Cardiovascular ROS: negative for - dyspnea on exertion, orthopnea, palpitations or shortness of breath Gastrointestinal ROS: negative for - abdominal pain, appetite loss, blood in stools, diarrhea or hematemesis Musculoskeletal ROS: negative for - joint pain, joint stiffness, joint swelling or muscle pain Neurological ROS: positive for - confusion Dermatological ROS: negative for dry skin, pruritus and rash   Blood pressure 111/61, pulse 69, temperature 96.7 F (35.9 C), temperature source Oral, resp. rate 20, height 5\' 3"  (1.6 m), weight 69.582 kg (153 lb 6.4 oz), SpO2 94.00%.   Neurologic Examination:   Mental Status: Alert, oriented to place but not date or year, she is easily distracted.  With clock drawing she was able to draw a circle and start placing numbers inside but could not finish.  She could not copy my image I had drawn on the paper or recall 3 objects. She was able to name objects and tell me what they are used for. Speech fluent without evidence of aphasia. Able to follow 3 step commands without difficulty. Cranial Nerves: II-Visual fields grossly intact. III/IV/VI-Extraocular movements intact.  Pupils reactive bilaterally. V/VII-Smile symmetric VIII-grossly intact IX/X-normal gag XI-bilateral shoulder shrug XII-midline tongue extension Motor: 5/5 bilaterally with normal tone and bulk Sensory: Pinprick and light touch intact throughout, bilaterally Deep Tendon Reflexes: 2+ and symmetric throughout Plantars: Downgoing bilaterally Cerebellar: Normal finger-to-nose   Lab Results  Component  Value Date/Time   CHOL 182 09/26/2009  7:53 PM    Results for orders placed during the hospital encounter of 12/16/11 (from the past 48 hour(s))  CULTURE, BLOOD (ROUTINE X 2)     Status: Normal (Preliminary result)   Collection Time   12/16/11 10:00 PM      Component Value Range Comment   Specimen Description BLOOD LEFT HAND      Special Requests BOTTLES DRAWN AEROBIC AND ANAEROBIC 5CC      Culture  Setup Time [id number]      Culture        Value:        BLOOD CULTURE RECEIVED NO GROWTH TO DATE CULTURE WILL BE HELD FOR 5 DAYS BEFORE ISSUING A FINAL NEGATIVE REPORT   Report Status PENDING     RPR     Status: Normal   Collection Time   12/16/11 10:05 PM      Component Value Range Comment   RPR NON REACTIVE  NON REACTIVE    T3, FREE     Status: Abnormal   Collection Time   12/16/11 10:05 PM      Component Value Range Comment   T3, Free 1.8 (*) 2.3 - 4.2 (pg/mL)   T4, FREE     Status: Normal   Collection Time   12/16/11 10:05 PM      Component Value Range Comment   Free T4 0.87  0.80 - 1.80 (ng/dL)   AMMONIA     Status: Normal   Collection Time   12/16/11 10:06 PM      Component Value Range Comment   Ammonia 26  11 - 60 (umol/L)   CULTURE, BLOOD (ROUTINE X 2)     Status: Normal (Preliminary result)   Collection Time   12/16/11 10:08 PM      Component Value Range Comment   Specimen Description BLOOD RIGHT ARM      Special Requests BOTTLES DRAWN AEROBIC AND ANAEROBIC 8CC      Culture  Setup Time [id number]      Culture        Value:        BLOOD CULTURE RECEIVED NO GROWTH TO DATE CULTURE WILL BE HELD FOR 5 DAYS BEFORE ISSUING A FINAL NEGATIVE REPORT   Report Status PENDING     GLUCOSE, CAPILLARY     Status: Abnormal   Collection Time   12/16/11 11:02 PM      Component Value Range Comment   Glucose-Capillary 174 (*) 70 - 99 (mg/dL)   COMPREHENSIVE METABOLIC PANEL     Status: Abnormal   Collection Time   12/17/11  6:05 AM      Component Value Range Comment   Sodium 139  135 -  145 (mEq/L)    Potassium 4.2  3.5 - 5.1 (mEq/L)    Chloride 95 (*) 96 - 112 (mEq/L)    CO2 37 (*) 19 - 32 (mEq/L)    Glucose, Bld 144 (*) 70 - 99 (mg/dL)    BUN 6  6 - 23 (mg/dL)    Creatinine, Ser 7.56 (*) 0.50 - 1.10 (mg/dL)    Calcium 9.9  8.4 - 10.5 (mg/dL)    Total Protein 6.7  6.0 - 8.3 (g/dL)    Albumin 3.3 (*) 3.5 - 5.2 (g/dL)    AST 12  0 - 37 (U/L)    ALT 9  0 - 35 (U/L)    Alkaline Phosphatase 76  39 - 117 (U/L)    Total Bilirubin 0.3  0.3 - 1.2 (mg/dL)    GFR calc non Af Amer >90  >90 (mL/min)    GFR calc Af Amer >90  >90 (mL/min)   CBC     Status: Abnormal   Collection Time   12/17/11  6:05 AM      Component Value Range Comment   WBC 8.5  4.0 - 10.5 (K/uL)    RBC 4.03  3.87 - 5.11 (MIL/uL)    Hemoglobin 11.8 (*) 12.0 -  15.0 (g/dL)    HCT 62.1  30.8 - 65.7 (%)    MCV 92.3  78.0 - 100.0 (fL)    MCH 29.3  26.0 - 34.0 (pg)    MCHC 31.7  30.0 - 36.0 (g/dL)    RDW 84.6  96.2 - 95.2 (%)    Platelets 278  150 - 400 (K/uL)   GLUCOSE, CAPILLARY     Status: Abnormal   Collection Time   12/17/11  8:02 AM      Component Value Range Comment   Glucose-Capillary 162 (*) 70 - 99 (mg/dL)    Comment 1 Notify RN     GLUCOSE, CAPILLARY     Status: Abnormal   Collection Time   12/17/11 12:03 PM      Component Value Range Comment   Glucose-Capillary 164 (*) 70 - 99 (mg/dL)    Comment 1 Notify RN     GLUCOSE, CAPILLARY     Status: Abnormal   Collection Time   12/17/11  4:44 PM      Component Value Range Comment   Glucose-Capillary 163 (*) 70 - 99 (mg/dL)   GLUCOSE, CAPILLARY     Status: Abnormal   Collection Time   12/17/11  8:36 PM      Component Value Range Comment   Glucose-Capillary 136 (*) 70 - 99 (mg/dL)    Comment 1 Notify RN     CBC     Status: Abnormal   Collection Time   12/18/11  6:25 AM      Component Value Range Comment   WBC 7.1  4.0 - 10.5 (K/uL)    RBC 4.05  3.87 - 5.11 (MIL/uL)    Hemoglobin 11.7 (*) 12.0 - 15.0 (g/dL)    HCT 84.1  32.4 - 40.1 (%)    MCV  90.4  78.0 - 100.0 (fL)    MCH 28.9  26.0 - 34.0 (pg)    MCHC 32.0  30.0 - 36.0 (g/dL)    RDW 02.7  25.3 - 66.4 (%)    Platelets 281  150 - 400 (K/uL)   BASIC METABOLIC PANEL     Status: Abnormal   Collection Time   12/18/11  6:25 AM      Component Value Range Comment   Sodium 138  135 - 145 (mEq/L)    Potassium 3.7  3.5 - 5.1 (mEq/L)    Chloride 94 (*) 96 - 112 (mEq/L)    CO2 38 (*) 19 - 32 (mEq/L)    Glucose, Bld 134 (*) 70 - 99 (mg/dL)    BUN 5 (*) 6 - 23 (mg/dL)    Creatinine, Ser 4.03  0.50 - 1.10 (mg/dL)    Calcium 9.7  8.4 - 10.5 (mg/dL)    GFR calc non Af Amer >90  >90 (mL/min)    GFR calc Af Amer >90  >90 (mL/min)   MAGNESIUM     Status: Normal   Collection Time   12/18/11  6:25 AM      Component Value Range Comment   Magnesium 1.6  1.5 - 2.5 (mg/dL)   PHOSPHORUS     Status: Normal   Collection Time   12/18/11  6:25 AM      Component Value Range Comment   Phosphorus 3.6  2.3 - 4.6 (mg/dL)   GLUCOSE, CAPILLARY     Status: Abnormal   Collection Time   12/18/11  7:53 AM      Component Value Range Comment   Glucose-Capillary 141 (*)  70 - 99 (mg/dL)   GLUCOSE, CAPILLARY     Status: Abnormal   Collection Time   12/18/11 12:01 PM      Component Value Range Comment   Glucose-Capillary 184 (*) 70 - 99 (mg/dL)    Comment 1 Documented in Chart      Comment 2 Notify RN       Ct Head Wo Contrast  12/16/2011  *RADIOLOGY REPORT*  Clinical Data: Altered mental status.  CT HEAD WITHOUT CONTRAST  Technique:  Contiguous axial images were obtained from the base of the skull through the vertex without contrast.  Comparison: 12/05/2011  Findings: Mild chronic small vessel disease changes throughout the deep white matter. No acute intracranial abnormality. Specifically, no hemorrhage, hydrocephalus, mass lesion, acute infarction, or significant intracranial injury.  No acute calvarial abnormality.  IMPRESSION: No acute intracranial abnormality.  Mild chronic small vessel disease.  Original  Report Authenticated By: Cyndie Chime, M.D.   Mr Laqueta Jean Wo Contrast  12/17/2011  *RADIOLOGY REPORT*  Clinical Data: 61 year old female with altered mental status, confusion. History of breast cancer.  MRI HEAD WITHOUT AND WITH CONTRAST  Technique:  Multiplanar, multiecho pulse sequences of the brain and surrounding structures were obtained according to standard protocol without and with intravenous contrast  Contrast: 14mL MULTIHANCE GADOBENATE DIMEGLUMINE 529 MG/ML IV SOLN  Comparison: head CTs 12/16/2011 and earlier.  Brain MRI 11/12/2011.  Findings: No restricted diffusion to suggest acute infarction.  No midline shift, mass effect, evidence of mass lesion, ventriculomegaly, extra-axial collection or acute intracranial hemorrhage.  Cervicomedullary junction is within normal limits. Major intracranial vascular flow voids are preserved.  Stable gray and white matter signal with nonspecific cerebral white matter T2 and FLAIR hyperintensity. No abnormal enhancement identified. Negative visualized cervical spine. Visualized bone marrow signal is within normal limits.  Visualized orbit soft tissues are within normal limits.  Left maxillary sinus mucous retention cyst.  Sinuses and mastoids are unchanged.  Unchanged fluid signal near the left anterior temporal tip, may be chronic fluid in an accessory pneumatized sphenoid bone sinus.   Negative scalp soft tissues.  IMPRESSION: No acute or metastatic intracranial abnormality.   Stable MRI appearance of the brain.  Original Report Authenticated By: Harley Hallmark, M.D.     Assessment/Plan:   61 YO female with history of memory decline.  Husband feels it has been declining over 2 years.  Per note she recently had a worsening of her confusion after recent hospital admission and SSRI was changed to Seroquel. RPR (-).   Work up has included and MRI of the brain, RPR, TSH, B12.  These findings were unremarkable.  Although LP could be performed for completeness  patient refuses that study.  EEG was performed and shows diffuse slowing.  Some of this may be related to medications.  No evidence of PLEDS activity or other epileptiform transients.    Recommend: 1) D/C Neurontin 2) ESR 3) Consider discontinuation of Seroquel and change to another medication for control of hallucinations.   4) Patient may benefit from complete neuropsych testing as an outpatient.  This may help further pinpoint diagnosis.   Felicie Morn PA-C [facility] [phone]  12/18/2011, 3:30 PM  Patient seen and examined. I agree with the above.  Thana Farr, MD [facility] [phone]  12/18/2011  7:36 PM

## 2011-12-18 NOTE — Consult Note (Signed)
Reason for Consult:Altered Mental Status Referring Physician: Dr. Shon Hale is an 61 y.o. female.  HPI: Patient presents to the emergency department by EMS from her home where EMS was called to scene by her husband with complaint of altered mental status. Per EMS, when they arrived on scene the patient was altered and did not have her oxygen on. Patient is supposed to be on daily home oxygen, 4 L. EMS reports that oxygen tubing was clogged with condensation. They state that they changed out the tubing and replaced patient's oxygen. Patient had a pulse ox in the 70s prior to changing tubing however quickly corrected to the mid 90s and EMS reports that her mental status rapidly improved with oxygenation. They bring patient to ER stating that her vital signs and oxygenation have remained stable and normal throughout transportation. Patient is lying comfortably in bed. She is unsure whether or not her family will be arriving to ER. Patient has no complaints. Patient is unsure why she is in ER. A level V caveat applies due to patient's dementia. Patient was recently admitted to the hospital due to rapidly progressing dementia and altered mental status with d/c summary making remark to changes in medications with improvement in mentation, stopping SSRI's and beginning seroquel at night. Patient was supposed to be followed up by her primary care provider today however patient states "I just didn't want to go." Patient has no physical complaints at this time.  AXIS Dementia, Altered mental status due to medical condition -hypoxia AXIS II Deferred AXIS III Past Medical History  Diagnosis Date  . Visual field defect 03/09/11    Visual field defect right eye. Dr Cammy Copa optomotrist. plans rechck 6 m  . Hypertension   . Diabetes mellitus   . COPD (chronic obstructive pulmonary disease)   . Breast cancer 2003  . Vertigo   . Shortness of breath   . Asthma     per patient  . GERD (gastroesophageal  reflux disease)     Past Surgical History  Procedure Date  . Tubal ligation   . Coronary angioplasty with stent placement 11/30/2003     [name], M.D, drug eluting stent   AXIS IV Complex medical conditions, non-adherence to medical regimen AXIS V  GAF  35  History reviewed. No pertinent family history.  Social History:  reports that she quit smoking about 11 years ago. Her smoking use included Cigarettes. She has a 75 pack-year smoking history. She has never used smokeless tobacco. She reports that she does not drink alcohol or use illicit drugs.  Allergies:  Allergies  Allergen Reactions  . Sertraline     Psychosis/mania  . Codeine     "unclear of where i was at and who anyone was"  . Zolpidem Tartrate     REACTION: sleepwalking    Medications: I have reviewed the patient's current medications.  Results for orders placed during the hospital encounter of 12/16/11 (from the past 48 hour(s))  CULTURE, BLOOD (ROUTINE X 2)     Status: Normal (Preliminary result)   Collection Time   12/16/11 10:00 PM      Component Value Range Comment   Specimen Description BLOOD LEFT HAND      Special Requests BOTTLES DRAWN AEROBIC AND ANAEROBIC 5CC      Culture  Setup Time [id number]      Culture        Value:        BLOOD CULTURE RECEIVED NO GROWTH  TO DATE CULTURE WILL BE HELD FOR 5 DAYS BEFORE ISSUING A FINAL NEGATIVE REPORT   Report Status PENDING     RPR     Status: Normal   Collection Time   12/16/11 10:05 PM      Component Value Range Comment   RPR NON REACTIVE  NON REACTIVE    T3, FREE     Status: Abnormal   Collection Time   12/16/11 10:05 PM      Component Value Range Comment   T3, Free 1.8 (*) 2.3 - 4.2 (pg/mL)   T4, FREE     Status: Normal   Collection Time   12/16/11 10:05 PM      Component Value Range Comment   Free T4 0.87  0.80 - 1.80 (ng/dL)   AMMONIA     Status: Normal   Collection Time   12/16/11 10:06 PM      Component Value Range Comment   Ammonia 26  11  - 60 (umol/L)   CULTURE, BLOOD (ROUTINE X 2)     Status: Normal (Preliminary result)   Collection Time   12/16/11 10:08 PM      Component Value Range Comment   Specimen Description BLOOD RIGHT ARM      Special Requests BOTTLES DRAWN AEROBIC AND ANAEROBIC 8CC      Culture  Setup Time [id number]      Culture        Value:        BLOOD CULTURE RECEIVED NO GROWTH TO DATE CULTURE WILL BE HELD FOR 5 DAYS BEFORE ISSUING A FINAL NEGATIVE REPORT   Report Status PENDING     GLUCOSE, CAPILLARY     Status: Abnormal   Collection Time   12/16/11 11:02 PM      Component Value Range Comment   Glucose-Capillary 174 (*) 70 - 99 (mg/dL)   COMPREHENSIVE METABOLIC PANEL     Status: Abnormal   Collection Time   12/17/11  6:05 AM      Component Value Range Comment   Sodium 139  135 - 145 (mEq/L)    Potassium 4.2  3.5 - 5.1 (mEq/L)    Chloride 95 (*) 96 - 112 (mEq/L)    CO2 37 (*) 19 - 32 (mEq/L)    Glucose, Bld 144 (*) 70 - 99 (mg/dL)    BUN 6  6 - 23 (mg/dL)    Creatinine, Ser 8.11 (*) 0.50 - 1.10 (mg/dL)    Calcium 9.9  8.4 - 10.5 (mg/dL)    Total Protein 6.7  6.0 - 8.3 (g/dL)    Albumin 3.3 (*) 3.5 - 5.2 (g/dL)    AST 12  0 - 37 (U/L)    ALT 9  0 - 35 (U/L)    Alkaline Phosphatase 76  39 - 117 (U/L)    Total Bilirubin 0.3  0.3 - 1.2 (mg/dL)    GFR calc non Af Amer >90  >90 (mL/min)    GFR calc Af Amer >90  >90 (mL/min)   CBC     Status: Abnormal   Collection Time   12/17/11  6:05 AM      Component Value Range Comment   WBC 8.5  4.0 - 10.5 (K/uL)    RBC 4.03  3.87 - 5.11 (MIL/uL)    Hemoglobin 11.8 (*) 12.0 - 15.0 (g/dL)    HCT 91.4  78.2 - 95.6 (%)    MCV 92.3  78.0 - 100.0 (fL)    MCH 29.3  26.0 -  34.0 (pg)    MCHC 31.7  30.0 - 36.0 (g/dL)    RDW 74.2  59.5 - 63.8 (%)    Platelets 278  150 - 400 (K/uL)   GLUCOSE, CAPILLARY     Status: Abnormal   Collection Time   12/17/11  8:02 AM      Component Value Range Comment   Glucose-Capillary 162 (*) 70 - 99 (mg/dL)    Comment 1 Notify RN       GLUCOSE, CAPILLARY     Status: Abnormal   Collection Time   12/17/11 12:03 PM      Component Value Range Comment   Glucose-Capillary 164 (*) 70 - 99 (mg/dL)    Comment 1 Notify RN     GLUCOSE, CAPILLARY     Status: Abnormal   Collection Time   12/17/11  4:44 PM      Component Value Range Comment   Glucose-Capillary 163 (*) 70 - 99 (mg/dL)   GLUCOSE, CAPILLARY     Status: Abnormal   Collection Time   12/17/11  8:36 PM      Component Value Range Comment   Glucose-Capillary 136 (*) 70 - 99 (mg/dL)    Comment 1 Notify RN     CBC     Status: Abnormal   Collection Time   12/18/11  6:25 AM      Component Value Range Comment   WBC 7.1  4.0 - 10.5 (K/uL)    RBC 4.05  3.87 - 5.11 (MIL/uL)    Hemoglobin 11.7 (*) 12.0 - 15.0 (g/dL)    HCT 75.6  43.3 - 29.5 (%)    MCV 90.4  78.0 - 100.0 (fL)    MCH 28.9  26.0 - 34.0 (pg)    MCHC 32.0  30.0 - 36.0 (g/dL)    RDW 18.8  41.6 - 60.6 (%)    Platelets 281  150 - 400 (K/uL)   BASIC METABOLIC PANEL     Status: Abnormal   Collection Time   12/18/11  6:25 AM      Component Value Range Comment   Sodium 138  135 - 145 (mEq/L)    Potassium 3.7  3.5 - 5.1 (mEq/L)    Chloride 94 (*) 96 - 112 (mEq/L)    CO2 38 (*) 19 - 32 (mEq/L)    Glucose, Bld 134 (*) 70 - 99 (mg/dL)    BUN 5 (*) 6 - 23 (mg/dL)    Creatinine, Ser 3.01  0.50 - 1.10 (mg/dL)    Calcium 9.7  8.4 - 10.5 (mg/dL)    GFR calc non Af Amer >90  >90 (mL/min)    GFR calc Af Amer >90  >90 (mL/min)   MAGNESIUM     Status: Normal   Collection Time   12/18/11  6:25 AM      Component Value Range Comment   Magnesium 1.6  1.5 - 2.5 (mg/dL)   PHOSPHORUS     Status: Normal   Collection Time   12/18/11  6:25 AM      Component Value Range Comment   Phosphorus 3.6  2.3 - 4.6 (mg/dL)   GLUCOSE, CAPILLARY     Status: Abnormal   Collection Time   12/18/11  7:53 AM      Component Value Range Comment   Glucose-Capillary 141 (*) 70 - 99 (mg/dL)   GLUCOSE, CAPILLARY     Status: Abnormal   Collection Time    12/18/11 12:01 PM  Component Value Range Comment   Glucose-Capillary 184 (*) 70 - 99 (mg/dL)    Comment 1 Documented in Chart      Comment 2 Notify RN     GLUCOSE, CAPILLARY     Status: Abnormal   Collection Time   12/18/11  5:16 PM      Component Value Range Comment   Glucose-Capillary 182 (*) 70 - 99 (mg/dL)    Comment 1 Notify RN      Comment 2 Documented in Chart       Mr Laqueta Jean Wo Contrast  12/17/2011  *RADIOLOGY REPORT*  Clinical Data: 61 year old female with altered mental status, confusion. History of breast cancer.  MRI HEAD WITHOUT AND WITH CONTRAST  Technique:  Multiplanar, multiecho pulse sequences of the brain and surrounding structures were obtained according to standard protocol without and with intravenous contrast  Contrast: 14mL MULTIHANCE GADOBENATE DIMEGLUMINE 529 MG/ML IV SOLN  Comparison: head CTs 12/16/2011 and earlier.  Brain MRI 11/12/2011.  Findings: No restricted diffusion to suggest acute infarction.  No midline shift, mass effect, evidence of mass lesion, ventriculomegaly, extra-axial collection or acute intracranial hemorrhage.  Cervicomedullary junction is within normal limits. Major intracranial vascular flow voids are preserved.  Stable gray and white matter signal with nonspecific cerebral white matter T2 and FLAIR hyperintensity. No abnormal enhancement identified. Negative visualized cervical spine. Visualized bone marrow signal is within normal limits.  Visualized orbit soft tissues are within normal limits.  Left maxillary sinus mucous retention cyst.  Sinuses and mastoids are unchanged.  Unchanged fluid signal near the left anterior temporal tip, may be chronic fluid in an accessory pneumatized sphenoid bone sinus.   Negative scalp soft tissues.  IMPRESSION: No acute or metastatic intracranial abnormality.   Stable MRI appearance of the brain.  Original Report Authenticated By: Harley Hallmark, M.D.    Review of Systems  Unable to perform ROS: other    Blood pressure 111/61, pulse 69, temperature 96.7 F (35.9 C), temperature source Oral, resp. rate 20, height 5\' 3"  (1.6 m), weight 69.582 kg (153 lb 6.4 oz), SpO2 94.00%. Physical Exam  Assessment/Plan:Chart  Chart reviewed,  Discussed with Psych CSW Pt returns with similar CC  - disconnection and/or malfunction of nasal canula connection with Oxygen tank.  She is confused today.  She cannot say why she is here and cannot remember what happened.   She is unable to provide any information  Mental Status Evaluation: Appearance:  Hair colored [or wig]  Behavior:  sleepy  Speech:  Slow,   Mood:  constricted  Affect:  normal and confused  Thought Process:  confused  Thought Content:  vaague  Sensorium:  person  Cognition:  grossly intact  Insight:  poor  Judgment:  impaired due to dementia, hypoxia  RECOMMENDATION 1. Due to repetitions presentations and admissions, consider transfer to SNF since she is unable to adhere to regimen or -consider pt decides not to take medication and will not accept medical regimen ; this is complicated by pt's dementia and SNF may be decided as necessary 2 Suggest "[place] patch 4.6 mg daily 3 Request Psych CSW to arrange meeting with Husband and pt.  4. Will follow pt   Governor Matos 12/18/2011, 7:09 PM

## 2011-12-18 NOTE — Progress Notes (Signed)
Family Medicine Teaching Service Attending Note  I interviewed and examined patient Maria Gaines and reviewed their tests and x-rays.  I discussed with Dr. [name] and reviewed their note for today.  I agree with their assessment and plan.     Additionally  Calm and cooperative this AM Nonfocal gait EEG noted Consult neurology for help in determining etiology for her mental status changes

## 2011-12-18 NOTE — Plan of Care (Signed)
Problem: Phase I Progression Outcomes Goal: Other Phase I Outcomes/Goals Outcome: Progressing Psychosocial concerns affecting care.  Psych consult pending.
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
## 2011-12-19 LAB — SEDIMENTATION RATE: Sed Rate: 30 mm/hr — ABNORMAL HIGH (ref 0–22)

## 2011-12-19 LAB — GLUCOSE, CAPILLARY: Glucose-Capillary: 173 mg/dL — ABNORMAL HIGH (ref 70–99)

## 2011-12-19 MED ORDER — ACETAMINOPHEN 325 MG PO TABS
650.0000 mg | ORAL_TABLET | Freq: Four times a day (QID) | ORAL | Status: DC | PRN
  Administered 2011-12-20 – 2011-12-22 (×3): 650 mg via ORAL
  Filled 2011-12-19 (×3): qty 2

## 2011-12-19 MED ORDER — RIVASTIGMINE 4.6 MG/24HR TD PT24
4.6000 mg | MEDICATED_PATCH | Freq: Every day | TRANSDERMAL | Status: DC
  Administered 2011-12-19 – 2011-12-22 (×4): 4.6 mg via TRANSDERMAL
  Filled 2011-12-19 (×4): qty 1

## 2011-12-19 NOTE — Progress Notes (Signed)
Family Medicine Teaching Service Attending Note  I interviewed and examined patient Maria Gaines and reviewed their tests and x-rays.  I discussed with Dr. Ashley Royalty and reviewed their note for today.  I agree with their assessment and plan.     Additionally  Awake alert no acute distress Oriented to place, month not year 2/3 objects at 5 minutes Can not tell me why she is in the hospital - "just can't remember"  Await placement options.  Patient is reluctant to go but husband feels he can not care for her.  Patient relates there are other relatives who could.  I asked her to contact them  Other wise will seek placement
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
## 2011-12-19 NOTE — Consult Note (Signed)
Reason for Consult:Altered Mental Status Referring Physician: [name] is an 61 y.o. female.  HPI: Patient presents to the emergency department by EMS from her home where EMS was called to scene by her husband with complaint of altered mental status. Per EMS, when they arrived on scene the patient was altered and did not have her oxygen on.  Patient was recently admitted to the hospital due to rapidly progressing dementia and altered mental status    Interval Hx;   Chart reviewed today. Pt was supposed to follow with pt psy after discharge. Per pt she did not and was not given the info at discharge. Per pt she is getting close to her baseline and per her husband she gets better but the she ends up in the hospital. Willing to follow with psy after discharge in future.  AXIS Dementia, Altered mental status due to medical condition -hypoxia AXIS II Deferred AXIS III Past Medical History  Diagnosis Date  . Visual field defect 03/09/11    Visual field defect right eye. Dr Maria Gaines optomotrist. plans rechck 6 m  . Hypertension   . Diabetes mellitus   . COPD (chronic obstructive pulmonary disease)   . Breast cancer 2003  . Vertigo   . Shortness of breath   . Asthma     per patient  . GERD (gastroesophageal reflux disease)     Past Surgical History  Procedure Date  . Tubal ligation   . Coronary angioplasty with stent placement 11/30/2003     Maria Gaines, M.D, drug eluting stent   AXIS IV Complex medical conditions, non-adherence to medical regimen AXIS V  GAF  35  History reviewed. No pertinent family history.  Social History:  reports that she quit smoking about 11 years ago. Her smoking use included Cigarettes. She has a 75 pack-year smoking history. She has never used smokeless tobacco. She reports that she does not drink alcohol or use illicit drugs.  Allergies:  Allergies  Allergen Reactions  . Sertraline     Psychosis/mania  . Codeine     "unclear of  where i was at and who anyone was"  . Zolpidem Tartrate     REACTION: sleepwalking    Medications: I have reviewed the patient's current medications.  Results for orders placed during the hospital encounter of 12/16/11 (from the past 48 hour(s))  CBC     Status: Abnormal   Collection Time   12/18/11  6:25 AM      Component Value Range Comment   WBC 7.1  4.0 - 10.5 (K/uL)    RBC 4.05  3.87 - 5.11 (MIL/uL)    Hemoglobin 11.7 (*) 12.0 - 15.0 (g/dL)    HCT 16.1  09.6 - 04.5 (%)    MCV 90.4  78.0 - 100.0 (fL)    MCH 28.9  26.0 - 34.0 (pg)    MCHC 32.0  30.0 - 36.0 (g/dL)    RDW 40.9  81.1 - 91.4 (%)    Platelets 281  150 - 400 (K/uL)   BASIC METABOLIC PANEL     Status: Abnormal   Collection Time   12/18/11  6:25 AM      Component Value Range Comment   Sodium 138  135 - 145 (mEq/L)    Potassium 3.7  3.5 - 5.1 (mEq/L)    Chloride 94 (*) 96 - 112 (mEq/L)    CO2 38 (*) 19 - 32 (mEq/L)    Glucose, Bld 134 (*)  70 - 99 (mg/dL)    BUN 5 (*) 6 - 23 (mg/dL)    Creatinine, Ser 1.61  0.50 - 1.10 (mg/dL)    Calcium 9.7  8.4 - 10.5 (mg/dL)    GFR calc non Af Amer >90  >90 (mL/min)    GFR calc Af Amer >90  >90 (mL/min)   MAGNESIUM     Status: Normal   Collection Time   12/18/11  6:25 AM      Component Value Range Comment   Magnesium 1.6  1.5 - 2.5 (mg/dL)   PHOSPHORUS     Status: Normal   Collection Time   12/18/11  6:25 AM      Component Value Range Comment   Phosphorus 3.6  2.3 - 4.6 (mg/dL)   GLUCOSE, CAPILLARY     Status: Abnormal   Collection Time   12/18/11  7:53 AM      Component Value Range Comment   Glucose-Capillary 141 (*) 70 - 99 (mg/dL)   GLUCOSE, CAPILLARY     Status: Abnormal   Collection Time   12/18/11 12:01 PM      Component Value Range Comment   Glucose-Capillary 184 (*) 70 - 99 (mg/dL)    Comment 1 Documented in Chart      Comment 2 Notify RN     GLUCOSE, CAPILLARY     Status: Abnormal   Collection Time   12/18/11  5:16 PM      Component Value Range Comment    Glucose-Capillary 182 (*) 70 - 99 (mg/dL)    Comment 1 Notify RN      Comment 2 Documented in Chart     GLUCOSE, CAPILLARY     Status: Abnormal   Collection Time   12/18/11 11:18 PM      Component Value Range Comment   Glucose-Capillary 140 (*) 70 - 99 (mg/dL)   SEDIMENTATION RATE     Status: Abnormal   Collection Time   12/19/11  4:48 AM      Component Value Range Comment   Sed Rate 30 (*) 0 - 22 (mm/hr)   GLUCOSE, CAPILLARY     Status: Abnormal   Collection Time   12/19/11  7:52 AM      Component Value Range Comment   Glucose-Capillary 159 (*) 70 - 99 (mg/dL)   GLUCOSE, CAPILLARY     Status: Abnormal   Collection Time   12/19/11 12:42 PM      Component Value Range Comment   Glucose-Capillary 173 (*) 70 - 99 (mg/dL)   GLUCOSE, CAPILLARY     Status: Abnormal   Collection Time   12/19/11  5:35 PM      Component Value Range Comment   Glucose-Capillary 150 (*) 70 - 99 (mg/dL)   GLUCOSE, CAPILLARY     Status: Abnormal   Collection Time   12/19/11  9:18 PM      Component Value Range Comment   Glucose-Capillary 120 (*) 70 - 99 (mg/dL)    Comment 1 Notify RN       No results found.  Review of Systems  Unable to perform ROS: other   Blood pressure 119/70, pulse 62, temperature 97.1 F (36.2 C), temperature source Oral, resp. rate 20, height 5\' 3"  (1.6 m), weight 69.582 kg (153 lb 6.4 oz), SpO2 97.00%. Physical Exam    Mental Status Evaluation: Appearance:  Hair colored [or wig]  Behavior:  sleepy  Speech:  Slow,   Mood:  constricted  Affect:  normal  and confused  Thought Process:  Confused at times, logical at times  Thought Content:  vaague  Sensorium:  person  Cognition:  grossly intact  Insight:  poor  Judgment:  impaired due to dementia, hypoxia  RECOMMENDATION  1. Out pt psy follow up including work up for dementia after discharge. Pt and her husband were informed again  2. Due to repetitions presentations and admissions, consider transfer to SNF as suggested by  Dr. Ferol Gaines 4. Will follow pt   Maria Gaines 12/19/2011, 11:23 PM

## 2011-12-20 LAB — GLUCOSE, CAPILLARY
Glucose-Capillary: 151 mg/dL — ABNORMAL HIGH (ref 70–99)
Glucose-Capillary: 156 mg/dL — ABNORMAL HIGH (ref 70–99)
Glucose-Capillary: 167 mg/dL — ABNORMAL HIGH (ref 70–99)
Glucose-Capillary: 163 mg/dL — ABNORMAL HIGH (ref 70–99)

## 2011-12-20 MED ORDER — POLYETHYLENE GLYCOL 3350 17 G PO PACK
17.0000 g | PACK | Freq: Every day | ORAL | Status: DC
  Administered 2011-12-20 – 2011-12-22 (×3): 17 g via ORAL
  Filled 2011-12-20 (×3): qty 1

## 2011-12-20 NOTE — Progress Notes (Signed)
Daily Progress Note   Subjective: No complaints this am.  States "I thought you were somebody coming in to kill me"  Objective: Vital signs in last 24 hours: Temp:  [96.7 F (35.9 C)-99.1 F (37.3 C)] 98.7 F (37.1 C) (04/21 0446) Pulse Rate:  [62-78] 62  (04/21 0446) Resp:  [18-20] 19  (04/21 0446) BP: (119-148)/(66-92) 133/69 mmHg (04/21 0446) SpO2:  [94 %-97 %] 97 % (04/21 0446) Weight:  [152 lb 8.9 oz (69.2 kg)] 152 lb 8.9 oz (69.2 kg) (04/21 0449) Weight change:  Last BM Date: 12/19/11  Intake/Output from previous day: 04/20 0701 - 04/21 0700 In: 940 [P.O.:940] Out: -  Intake/Output this shift: Total I/O In: 460 [P.O.:460] Out: -  Gen: awake, alert, oriented to person but unsure where she is. HEENT: NCAT, PERRLA, EOMI Cardiac: RRR, no murmurs Lungs: poor air movement, minimal wheezing bilaterally, normal work of breathing  Extremities: warm well perfused  Psych: non-agitated, normal affect, no active auditory or visual hallucination  Lab Results:  [facility] 12/18/11 0625  WBC 7.1  HGB 11.7*  HCT 36.6  PLT 281   BMET  Basename 12/18/11 0625  NA 138  K 3.7  CL 94*  CO2 38*  GLUCOSE 134*  BUN 5*  CREATININE 0.52  CALCIUM 9.7   CBG (last 3)   Basename 12/19/11 2118 12/19/11 1735 12/19/11 1242  GLUCAP 120* 150* 173*     Studies/Results: CT Head: MPRESSION: No acute intracranial abnormality.  Mild chronic small vessel disease.  MRI IMPRESSION: No acute or metastatic intracranial abnormality.   Stable MRI appearance of the brain.  Original Report Authenticated By: Harley Hallmark, M.D  EEG : diffuse slowing  Urine Culture: negative   Medications: I have reviewed the patient's current medications.  Assessment/Plan: 61 year old F with PMH of COPD, CAD, and dementia who presents with behavior changes and hallucinations after being hospitalized with the same problem 1 week ago. She appears back to baseline today.   1. 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  -Exelon patch started  -Neuro: d/c neurontin; complete neuropsych testing outpatient.   - Continue Seroquel and Ativan PRN  2. CV  A. HTN - continue carvedilol, benazepril, diltiazem  B. CAD - continue ASA 81 mg, Plavix  3. Pulm - Long standing COPD  - continue Advair and O2, albuterol PRN  4. Endocrine - DM  - sliding scale insulin  5. FENGI - carb modified diet, B-12 PO, KVO IVF   6. PPX - heparin; protonix;   7. Dispo - Given that her symptoms are waxing and waning, it would be more beneficial for her to be in SNF for her care.  Await placement options   LOS: 4 days   Ramari Bray 12/20/2011, 6:12 AM

## 2011-12-20 NOTE — Progress Notes (Signed)
Family Medicine Teaching Service Attending Note  I interviewed and examined patient Maria Gaines and reviewed their tests and x-rays.  I discussed with Dr. Ashley Royalty and reviewed their note for today.  I agree with their assessment and plan.     Additionally  restign comfortably in chair Not sure why she is here Await placement

## 2011-12-20 NOTE — Consult Note (Addendum)
Reason for Consult:Altered Mental Status Referring Physician: [name] is an 61 y.o. female.  HPI: Patient presents to the emergency department by EMS from her home where EMS was called to scene by her husband with complaint of altered mental status. Per EMS, when they arrived on scene the patient was altered and did not have her oxygen on.  Patient was recently admitted to the hospital due to rapidly progressing dementia and altered mental status.  Pt was supposed to follow with pt psy after discharge. Per pt she did not and was not given the info at discharge.   Interval Hx;   Chart reviewed today. Per pt and her husband she is getting close to her baseline. She is willing to follow with psy after discharge in future. Per husband he does not know how to help her and he has noticed a lot of memory loss in the last few years. Pt thinks she has dementia. Was able to laugh and smile.   AXIS Dementia, Altered mental status due to medical condition -hypoxia AXIS II Deferred AXIS III Past Medical History  Diagnosis Date  . Visual field defect 03/09/11    Visual field defect right eye. Dr Cammy Copa optomotrist. plans rechck 6 m  . Hypertension   . Diabetes mellitus   . COPD (chronic obstructive pulmonary disease)   . Breast cancer 2003  . Vertigo   . Shortness of breath   . Asthma     per patient  . GERD (gastroesophageal reflux disease)     Past Surgical History  Procedure Date  . Tubal ligation   . Coronary angioplasty with stent placement 11/30/2003     [name], M.D, drug eluting stent   AXIS IV Complex medical conditions, non-adherence to medical regimen AXIS V  GAF  35  History reviewed. No pertinent family history.  Social History:  reports that she quit smoking about 11 years ago. Her smoking use included Cigarettes. She has a 75 pack-year smoking history. She has never used smokeless tobacco. She reports that she does not drink alcohol or use illicit  drugs.  Allergies:  Allergies  Allergen Reactions  . Sertraline     Psychosis/mania  . Codeine     "unclear of where i was at and who anyone was"  . Zolpidem Tartrate     REACTION: sleepwalking    Medications: I have reviewed the patient's current medications.  Results for orders placed during the hospital encounter of 12/16/11 (from the past 48 hour(s))  GLUCOSE, CAPILLARY     Status: Abnormal   Collection Time   12/18/11 11:18 PM      Component Value Range Comment   Glucose-Capillary 140 (*) 70 - 99 (mg/dL)   SEDIMENTATION RATE     Status: Abnormal   Collection Time   12/19/11  4:48 AM      Component Value Range Comment   Sed Rate 30 (*) 0 - 22 (mm/hr)   GLUCOSE, CAPILLARY     Status: Abnormal   Collection Time   12/19/11  7:52 AM      Component Value Range Comment   Glucose-Capillary 159 (*) 70 - 99 (mg/dL)   GLUCOSE, CAPILLARY     Status: Abnormal   Collection Time   12/19/11 12:42 PM      Component Value Range Comment   Glucose-Capillary 173 (*) 70 - 99 (mg/dL)   GLUCOSE, CAPILLARY     Status: Abnormal   Collection Time  12/19/11  5:35 PM      Component Value Range Comment   Glucose-Capillary 150 (*) 70 - 99 (mg/dL)   GLUCOSE, CAPILLARY     Status: Abnormal   Collection Time   12/19/11  9:18 PM      Component Value Range Comment   Glucose-Capillary 120 (*) 70 - 99 (mg/dL)    Comment 1 Notify RN     GLUCOSE, CAPILLARY     Status: Abnormal   Collection Time   12/20/11  7:45 AM      Component Value Range Comment   Glucose-Capillary 151 (*) 70 - 99 (mg/dL)   GLUCOSE, CAPILLARY     Status: Abnormal   Collection Time   12/20/11 12:28 PM      Component Value Range Comment   Glucose-Capillary 156 (*) 70 - 99 (mg/dL)   GLUCOSE, CAPILLARY     Status: Abnormal   Collection Time   12/20/11  5:11 PM      Component Value Range Comment   Glucose-Capillary 167 (*) 70 - 99 (mg/dL)     No results found.  Review of Systems  Unable to perform ROS: other   Blood pressure  127/74, pulse 71, temperature 97.2 F (36.2 C), temperature source Oral, resp. rate 20, height 5\' 3"  (1.6 m), weight 69.2 kg (152 lb 8.9 oz), SpO2 95.00%. Physical Exam    Mental Status Evaluation: Appearance:  calm  Behavior:  sleepy  Speech:  Slow,   Mood:  ok  Affect:  constricted  Thought Process:  Confused at times,  Thought Content:  vaague  Sensorium:  To only Person and situation only, not to time, date, year  Cognition:  grossly intact  Insight:  poor  Judgment:  impaired   RECOMMENDATION  1. Out pt psy follow up including work up for dementia after discharge. Pt and her husband were informed again today  2. Pt and husband were educated about the her cognitive deficits and the available options for treatment and work up  2. transfer to SNF as suggested by Dr. Ferol Luz earlier  3. Will follow pt per Dr. Ferol Luz plan   Maria Gaines, Maria Gaines 12/20/2011, 10:00 PM

## 2011-12-21 LAB — GLUCOSE, CAPILLARY: Glucose-Capillary: 137 mg/dL — ABNORMAL HIGH (ref 70–99)

## 2011-12-21 NOTE — Progress Notes (Signed)
Seen and examined.  Agree with Dr. [name].  We plan no further WU.  She is medically stable for SNF placement when bed available.

## 2011-12-21 NOTE — Progress Notes (Signed)
Occupational Therapy Discharge Note   Order received, chart reviewed, noted per PT note and speaking with PT that pt's biggest need is supervision for safety due to history of dementia. Will defer OT eval to SNF since this is currently her discharge plan. Acute OT will sign off.

## 2011-12-21 NOTE — Progress Notes (Signed)
[facility] PROGRESS [facility]    SUBJECTIVE   No complaints.    OBJECTIVE   Vital signs in last 24 hours: Temp:  [97.2 F (36.2 C)-98.2 F (36.8 C)] 98.2 F (36.8 C) (04/22 0527) Pulse Rate:  [68-71] 71  (04/22 0527) Resp:  [17-20] 17  (04/22 0527) BP: (93-127)/(60-74) 106/60 mmHg (04/22 1026) SpO2:  [92 %-96 %] 92 % (04/22 0807) Weight:  [69 kg (152 lb 1.9 oz)] 69 kg (152 lb 1.9 oz) (04/22 0500)  Intake/Output from previous day: 04/21 0701 - 04/22 0700 In: 940 [P.O.:940] Out: -  Intake/Output this shift: Total I/O In: 120 [P.O.:120] Out: -  Nutritional status: Carb Control  Past Medical History  Diagnosis Date  . Visual field defect 03/09/11    Visual field defect right eye. Dr Cammy Copa optomotrist. plans rechck 6 m  . Hypertension   . Diabetes mellitus   . COPD (chronic obstructive pulmonary disease)   . Breast cancer 2003  . Vertigo   . Shortness of breath   . Asthma     per patient  . GERD (gastroesophageal reflux disease)     Neurologic Exam:  Mental Status:  Alert, oriented to place, month, but has difficulty with year.  She was able to spell D-L-R of [id number], able to add numbers and tell me objects.  Speech fluent without evidence of aphasia. Able to follow 3 step commands without difficulty.  Cranial Nerves:  II-Visual fields grossly intact.  III/IV/VI-Extraocular movements intact. Pupils reactive bilaterally.  V/VII-Smile symmetric  VIII-grossly intact  IX/X-normal gag  XI-bilateral shoulder shrug  XII-midline tongue extension  Motor: 5/5 bilaterally with normal tone and bulk  Sensory: Pinprick and light touch intact throughout, bilaterally  Deep Tendon Reflexes: 2+ and symmetric throughout  Plantars: Downgoing bilaterally  Cerebellar: Normal finger-to-nose   Lab Results: Results for orders placed during the hospital encounter of 12/16/11 (from the past 24 hour(s))  GLUCOSE, CAPILLARY     Status:  Abnormal   Collection Time   12/20/11 12:28 PM      Component Value Range   Glucose-Capillary 156 (*) 70 - 99 (mg/dL)  GLUCOSE, CAPILLARY     Status: Abnormal   Collection Time   12/20/11  5:11 PM      Component Value Range   Glucose-Capillary 167 (*) 70 - 99 (mg/dL)  GLUCOSE, CAPILLARY     Status: Abnormal   Collection Time   12/20/11  9:57 PM      Component Value Range   Glucose-Capillary 163 (*) 70 - 99 (mg/dL)   Comment 1 Notify RN    GLUCOSE, CAPILLARY     Status: Abnormal   Collection Time   12/21/11  8:04 AM      Component Value Range   Glucose-Capillary 158 (*) 70 - 99 (mg/dL)   Comment 1 Notify RN      Lipid Panel No results found for this basename: CHOL,TRIG,HDL,CHOLHDL,VLDL,LDLCALC in the last 72 hours  Studies/Results: No results found.  Medications:     Scheduled:   . aspirin EC  81 mg Oral Daily  . benazepril  5 mg Oral Daily  . carvedilol  6.25 mg Oral BID WC  . clopidogrel  75 mg Oral Daily  . diltiazem  180 mg Oral Daily  .  Fluticasone-Salmeterol  1 puff Inhalation BID  . gadobenate dimeglumine  14 mL Intravenous Once  . heparin  5,000 Units Subcutaneous Q8H  . insulin aspart  0-15 Units Subcutaneous TID WC  . polyethylene glycol  17 g Oral Daily  . rivastigmine  4.6 mg Transdermal Daily  . sodium chloride  3 mL Intravenous Q12H  . vitamin B-12  1,000 mcg Oral Daily    Assessment/Plan:   61 YO female with 2 year memory decline worsening after receiving Seroquel.  EEG shows background slowing consistent with nonspecific etiology which may include encephalopathy among other possiblities. Patient has been started on Exelon patch. ESR 30.  Recommend: 1) further out patient neuropsychiatry evaluation       No further neurology recommendations.  Neurology will Sign off.    Felicie Morn PA-C [facility] [phone]  12/21/2011, 10:37 AM
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
## 2011-12-21 NOTE — Progress Notes (Signed)
Clinical Social Work Department CLINICAL SOCIAL WORK PLACEMENT NOTE 12/21/2011  Patient:  JENELL, DOBRANSKY  Account Number:  [id number] Admit date:  12/16/2011  Clinical Social Worker:  Theresia Bough, Theresia Majors  Date/time:  12/21/2011 12:51 PM  Clinical Social Work is seeking post-discharge placement for this patient at the following level of care:   SKILLED NURSING   (*CSW will update this form in Epic as items are completed)     Patient/family provided with [facility] Department of Clinical Social Work's list of facilities offering this level of care within the geographic area requested by the patient (or if unable, by the patient's family).    Patient/family informed of their freedom to choose among providers that offer the needed level of care, that participate in Medicare, Medicaid or managed care program needed by the patient, have an available bed and are willing to accept the patient.    Patient/family informed of [facility]' ownership interest in [facility], as well as of the fact that they are under no obligation to receive care at this facility.  PASARR submitted to EDS on 12/21/2011 PASARR number received from EDS on   FL2 transmitted to all facilities in geographic area requested by pt/family on   FL2 transmitted to all facilities within larger geographic area on   Patient informed that his/her managed care company has contracts with or will negotiate with  certain facilities, including the following:     Patient/family informed of bed offers received:   Patient chooses bed at  Physician recommends and patient chooses bed at    Patient to be transferred to  on   Patient to be transferred to facility by   The following physician request were entered in Epic:   Additional Comments:  Theresia Bough, MSW, [employer] [phone]

## 2011-12-21 NOTE — Progress Notes (Signed)
Daily Progress Note Si Maria Gaines. [name], M.D., M.B.A  Family Medicine PGY-1 Pager [phone]  Subjective: No complaints this am.  With her husband. In good spirits. Cannot remember what she had for dinner last night.   Objective: Vital signs in last 24 hours: Temp:  [96.7 F (35.9 C)-98.2 F (36.8 C)] 96.7 F (35.9 C) (04/22 1415) Pulse Rate:  [71-77] 77  (04/22 1415) Resp:  [17-20] 18  (04/22 1415) BP: (106-127)/(60-74) 106/60 mmHg (04/22 1026) SpO2:  [84 %-100 %] 100 % (04/22 1605) Weight:  [152 lb 1.9 oz (69 kg)] 152 lb 1.9 oz (69 kg) (04/22 0500) Weight change: -7.1 oz (-0.2 kg) Last BM Date: 12/20/11  Intake/Output from previous day: 04/21 0701 - 04/22 0700 In: 940 [P.O.:940] Out: -  Intake/Output this shift: Total I/O In: 120 [P.O.:120] Out: -  Gen: awake, alert, oriented to person, alerted to person and place but not time and sitation  HEENT: NCAT, PERRLA, EOMI Cardiac: RRR, no murmurs Lungs: poor air movement, minimal wheezing bilaterally, normal work of breathing  Extremities: warm well perfused  Psych: non-agitated, normal affect, no active auditory or visual hallucination; poor short term recall  Lab Results: No results found for this basename: WBC:2,HGB:2,HCT:2,PLT:2 in the last 72 hours BMET No results found for this basename: NA:2,K:2,CL:2,CO2:2,GLUCOSE:2,BUN:2,CREATININE:2,CALCIUM:2 in the last 72 hours CBG (last 3)   Basename 12/21/11 1158 12/21/11 0804 12/20/11 2157  GLUCAP 198* 158* 163*     Studies/Results: CT Head: MPRESSION: No acute intracranial abnormality.  Mild chronic small vessel disease.  MRI IMPRESSION: No acute or metastatic intracranial abnormality.   Stable MRI appearance of the brain.  Original Report Authenticated By: Harley Hallmark, M.D  EEG : diffuse slowing  Urine Culture: negative   Medications: I have reviewed the patient's current medications.  Assessment/Plan: 61 year old F with PMH of COPD, CAD, and dementia who  presents with behavior changes and hallucinations after being hospitalized with the same problem 1 week ago. She appears back to baseline today.   1. AMS - Comprehensive work-up that has decreased likelihood of Neuro issues such as CVA or seizures, no evidence of metabolic or endocrine issues, infectious work-up showed negative RPR , urine culture, and she's without signs of CNS infection, so we are favoring dementia with psychotic features; hold meclizine as it may be worsening symptoms   -Symptoms seem to wax and wane frequently, during some interactions with her she seems competent  but at other times she is completely disoriented and doesn't know why she is here.  -Exelon patch   -Neuro: d/c neurontin; complete neuropsych testing outpatient.   - Continue Seroquel and Ativan PRN  2. CV  A. HTN - continue carvedilol, benazepril, diltiazem  B. CAD - continue ASA 81 mg, Plavix  3. Pulm - Long standing COPD  - continue Advair and O2, albuterol PRN  4. Endocrine - DM  - sliding scale insulin  5. FENGI - carb modified diet, B-12 PO, KVO IVF   6. PPX - heparin; protonix;   7. Dispo - Given that her symptoms are waxing and waning, it would be more beneficial for her to be in SNF for her care.  Await placement options   LOS: 5 days   Mat Carne 12/21/2011, 4:42 PM

## 2011-12-21 NOTE — Progress Notes (Signed)
Clinical Social Work Department BRIEF PSYCHOSOCIAL ASSESSMENT 12/21/2011  Patient:  Maria Gaines, Maria Gaines     Account Number:  [id number]     Admit date:  12/16/2011  Clinical Social Worker:  Lourdes Sledge  Date/Time:  12/21/2011 12:55 PM  Referred by:  Physician  Date Referred:  12/21/2011 Referred for  SNF Placement   Other Referral:   Interview type:  Family Other interview type:   Spouse: Donnelle Rubey [phone]    PSYCHOSOCIAL DATA Living Status:  HUSBAND Admitted from facility:   Level of care:   Primary support name:  Telissa Palmisano Primary support relationship to patient:  SPOUSE Degree of support available:   Pt lives with spouse however spouse would prefer she receive rehab before returning home.    CURRENT CONCERNS Current Concerns  Post-Acute Placement   Other Concerns:    SOCIAL WORK ASSESSMENT / PLAN CSW completed assessment with pt spouse. Pt spouse is agreeable to placement before pt returns home. Pt spouse able to care for pt whenever she returns home from placement. CSW spoke with pt spouse about pt applying for Medicaid for long term care in the future. Pt spouse agreeable and aware that pt may need Medicaid.CSW will fax pt out and complete a 30 day pasarr.   Assessment/plan status:  Psychosocial Support/Ongoing Assessment of Needs Other assessment/ plan:   Information/referral to community resources:    PATIENT'S/FAMILY'S RESPONSE TO PLAN OF CARE: Pt has presented with AMS since being admitted and seen by the psychiatry team. Pt currently not able to make decisions so CSW completed assessment with pt spouse who was helpful in providing information and decision making.        Theresia Bough, MSW, Theresia Majors [phone]
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
## 2011-12-21 NOTE — Progress Notes (Signed)
Patient O2 removed to check sats. Patient sats 88% RA. Ayesha Rumpf RN

## 2011-12-22 ENCOUNTER — Telehealth: Payer: Self-pay | Admitting: *Deleted

## 2011-12-22 MED ORDER — QUETIAPINE FUMARATE 25 MG PO TABS
25.0000 mg | ORAL_TABLET | Freq: Every day | ORAL | Status: DC
  Filled 2011-12-22: qty 1

## 2011-12-22 MED ORDER — RIVASTIGMINE 4.6 MG/24HR TD PT24: 1.0000 | MEDICATED_PATCH | Freq: Every day | TRANSDERMAL | Status: DC

## 2011-12-22 NOTE — Progress Notes (Signed)
Clinical Child psychotherapist (CSW) prepared pt [employer] and faxed dc summary to facility. Pt and spouse agreeable and pleased with placement at [facility]. CSW has contacted [facility] for a 16:00pm transport. No further CSW needs addressed CSW is signing off.  Theresia Bough, MSW, Theresia Majors [phone]

## 2011-12-22 NOTE — Discharge Summary (Signed)
Physician Discharge Summary  Patient ID: Maria Gaines MRN: [id number] DOB/AGE: 09/06/1950 62 y.o.  Admit date: 12/16/2011 Discharge date: 12/22/2011  Admission Diagnoses: Altered Mental Status Dementia COPD  Diabetes  CAD s/p stent  Discharge Diagnoses:  Principal Problem:  *Confusion Active Problems:  DIABETES MELLITUS II, UNCOMPLICATED  BIPOLAR DISORDER  HYPERTENSION, BENIGN SYSTEMIC  GASTROESOPHAGEAL REFLUX, NO ESOPHAGITIS  Dementia   Discharged Condition: fair  Hospital Course:  Mrs Hammad is a 61 year old female with dementia, COPD, CAD s/p stents and a history of breast cancer who presents with altered mental status after being discharged from the hospital for the same problem 7 days ago.   1. Altered Mental Status  - A comprehensive work-up included MRI of the brain and CT of the head that rule out stroke, tumor, etc. An EEG ruled-out epilepsy or post-ictal state. A neurologist was consulted who recommended outpatient psychiatric work-up and discontinuing neurontin. An inpatient psychiatry consultation recommended starting rivastigmine for dementia and having outpatient evaluations for dementia and continuing seroquel at night. Other causes of altered mental status such as infection and hepatic encephalopathy were ruled-out. Within 48 hours of admission, the patient has returned to her baseline mental status. She was alert to person and place, but not situation or time. She was very forgetful, but pleasant. 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 She will be discharged with rivastigmine and seroquel.  2. CV - Her come medication for coronary artery disease and hypertension were continued without incident.   3. Pulm - Her long standing COPD was treated with Advair and O2, which was stable at 2L. She will need to continue oxygen at 2-3 L upon discharge.   4. Endocrine - Her metformin was held in place of sliding scale insulin. However, it was restarted  at discharge.   Allergies  Allergen Reactions  . Sertraline     Psychosis/mania  . Codeine     "unclear of where i was at and who anyone was"  . Zolpidem Tartrate     REACTION: sleepwalking    Consults: neurology and psychiatry  Significant Diagnostic Studies:  EEG: diffuse slowing, no epileptiform waves MRI Brain: No acute or metastatic intracranial abnormality. Stable MRI appearance of the brain. CT Head: No acute intracranial abnormality. Mild chronic small vessel disease.   RPR: negative Urine culture: negative Blood culture: negative   Ammonia: 26  Free T3: 1.8 (low) Free T4: 0.87 (low)  Treatments: see HPI  Discharge Exam: Blood pressure 107/64, pulse 75, temperature 96.5 F (35.8 C), temperature source Oral, resp. rate 18, height 5\' 3"  (1.6 m), weight 150 lb (68.04 kg), SpO2 95.00%. Gen: awake, alert, oriented to person, alerted to person and place but not time and sitation  HEENT: NCAT, PERRLA, EOMI  Cardiac: RRR, no murmurs  Lungs: poor air movement, minimal wheezing bilaterally, normal work of breathing  Extremities: warm well perfused  Psych: non-agitated, normal affect, no active auditory or visual hallucination; poor short term recall   Disposition: Nursing facility  Discharge Orders    Future Appointments: Provider: Department: Dept Phone: Center:   01/06/2012 10:45 AM Nestor Ramp, MD [facility] [phone] [facility]     Future Orders Please Complete By Expires   Discharge to SNF when bed available        Medication List  As of 12/22/2011  2:11 PM   STOP taking these medications         gabapentin 300 MG capsule  KLOR-CON 10 10 MEQ tablet      meclizine 25 MG tablet         TAKE these medications         acetaminophen 500 MG tablet   Commonly known as: TYLENOL   Take 1,000 mg by mouth every 6 (six) hours as needed. For pain      aspirin 81 MG tablet   Take 81 mg by mouth daily.      atorvastatin 80 MG tablet   Commonly known as:  LIPITOR   Take 80 mg by mouth daily.      benazepril 5 MG tablet   Commonly known as: LOTENSIN   Take 1 tablet (5 mg total) by mouth daily.      carvedilol 6.25 MG tablet   Commonly known as: COREG   Take 6.25 mg by mouth 2 (two) times daily with a meal.      diltiazem 180 MG 24 hr capsule   Commonly known as: CARDIZEM CD   Take 1 capsule (180 mg total) by mouth daily.      Fluticasone-Salmeterol 250-50 MCG/DOSE Aepb   Commonly known as: ADVAIR   Inhale 1 puff into the lungs every 12 (twelve) hours.      LORazepam 2 MG tablet   Commonly known as: ATIVAN   Take 0.5 tablets (1 mg total) by mouth 3 (three) times daily.      metFORMIN 1000 MG tablet   Commonly known as: GLUCOPHAGE   Take 1,000 mg by mouth 2 (two) times daily with a meal.      omeprazole 40 MG capsule   Commonly known as: PRILOSEC   Take 40 mg by mouth daily.      PLAVIX 75 MG tablet   Generic drug: clopidogrel   Take 75 mg by mouth daily.      potassium chloride 10 MEQ tablet   Commonly known as: K-DUR,KLOR-CON   Take 10 mEq by mouth daily.      QUEtiapine 25 MG tablet   Commonly known as: SEROQUEL   Take 1 tablet (25 mg total) by mouth at bedtime.      rivastigmine 4.6 mg/24hr   Commonly known as: EXELON   Place 1 patch (4.6 mg total) onto the skin daily.           Follow-up Information    Follow up with [name], MD on 01/06/2012. (Appointment at 10:45 AM)    Contact information:   [id number] [place] [address] [phone]          Signed: Mat Carne 12/22/2011, 2:11 PM

## 2011-12-22 NOTE — Telephone Encounter (Signed)
PA required for [employer]. Form placed in Dr. Donnetta Hail box.

## 2011-12-22 NOTE — Discharge Summary (Signed)
Seen and examined.  Agree with DC as outlined by Dr. Williamson. 
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
## 2011-12-22 NOTE — Progress Notes (Signed)
Clinical Child psychotherapist (CSW) provided pt and spouse with bed offers to SNF placement. Pt and spouse have chosen placement for pt at [facility]. CSW will facilitate with dc today.   Theresia Bough, MSW, Theresia Majors [phone]

## 2011-12-22 NOTE — Progress Notes (Signed)
Inpatient Diabetes Program Recommendations  AACE/ADA: New Consensus Statement on Inpatient Glycemic Control  Target Ranges:  Prepandial:   less than 140 mg/dL      Peak postprandial:   less than 180 mg/dL (1-2 hours)      Critically ill patients:  140 - 180 mg/dL  Pager:  [phone] Hours:  8 am-10pm   Reason for Visit: Elevated glucose:  Results for Maria Gaines, Maria Gaines (MRN [id number]) as of 12/22/2011 10:03  Ref. Range 12/21/2011 08:04 12/21/2011 11:58 12/21/2011 17:22 12/21/2011 21:44 12/22/2011 07:58  Glucose-Capillary Latest Range: 70-99 mg/dL 147 (H) 829 (H) 562 (H) 137 (H) 199 (H)    Inpatient Diabetes Program Recommendations Insulin - Basal: Add Lantus 15 units daily while Metformin on hold

## 2011-12-23 ENCOUNTER — Telehealth: Payer: Self-pay | Admitting: Family Medicine

## 2011-12-23 LAB — CULTURE, BLOOD (ROUTINE X 2)
Culture  Setup Time: 201304180410
Culture: NO GROWTH

## 2011-12-23 NOTE — Telephone Encounter (Signed)
Left mssg Will try to call her again later otherwise I am out of town until Monday

## 2011-12-23 NOTE — Telephone Encounter (Signed)
Is asking to speak with Dr [name] about her mom.  Wants to know how long she'll have to be in the rehab facility and why she is there.

## 2011-12-23 NOTE — Telephone Encounter (Signed)
Will forward to Dr Neal 

## 2011-12-25 LAB — HEAVY METALS SCREEN, URINE

## 2011-12-28 NOTE — Telephone Encounter (Signed)
Daughter is calling to speak to Dr. [name].  Patient has said her breathing is pretty bad but the facility is not giving her the inhalers.

## 2011-12-29 NOTE — Telephone Encounter (Signed)
Dear [name] Team Can you call her daughter and see if issues have been solved. We kept her on her advair inhaler--I am NOT clear that they ordered PRN nebulizers or PRN albuterol---we can do that if she needs them. I STOPPED her theophylline as it may be clouding issues but that is probably still in her sytem and I do not think it was particularly helpful anyway. THANKS! [name]

## 2011-12-29 NOTE — Telephone Encounter (Signed)
I spoke with JoLynn and relayed info below. She and rOGER ARE GOING TO DISCUSS OPTIONS TONIGHT OR TOMORROW RE WHETHER OR NOT SHE RETURNS HOME, STAYS IN CURRENT snf, IS MOVED TO snf IN [place] WHERE HER DAUGHTER LIVES. jOLYNN WIIL UPDATE ME TOMORROW. [name]

## 2011-12-29 NOTE — Telephone Encounter (Signed)
Dr. [name], I spoke with patient's daughter and informed her of below. Patient is staying at [facility] off of [address]. Patient's husband has told JoLynn that she is being seen by physician on call today @ 10am. To daughter's knowledge per patient is that she has been having breathing issues and they are not giving her the inhaler. Question that has come up is if you still have prescribed her this inhaler.  -----[name]

## 2011-12-29 NOTE — Telephone Encounter (Signed)
Dear [name] Team Please call daughter--if needed I can call her back this afternonm (I am on rounds this AM) Please tell daughter that I cannot DO much if her Mom is in nursing home --I am not sure which one she ended up going to---can you find that out--I can call and review her meds with their staff. Does she need to be SEEN?  I will check back w you at 2 pm or so. Page me if urgent before then [facility]! [name]

## 2012-01-05 ENCOUNTER — Telehealth: Payer: Self-pay | Admitting: Family Medicine

## 2012-01-05 NOTE — Telephone Encounter (Signed)
LMOM for patient to call back.

## 2012-01-05 NOTE — Telephone Encounter (Signed)
[name] (patient's husband) says that the facility will not let patient leave with him and that he will contact charge nurse so that patient is able to make her 10:45am tomorrow. [name] was informed of this appointment and he repeated it to me twice.

## 2012-01-05 NOTE — Telephone Encounter (Signed)
Dr. [name], [name] states that Tyffani is still in [facility] and that she has an appointment with tomorrow and is wondering how this is going to be possible if she is there

## 2012-01-05 NOTE — Telephone Encounter (Signed)
Mr. Houchin is calling to speak to Dr. [name] about what is going on with his wife.  He confirmed her appt for tomorrow, but his wife is in the rest home and he wants to speak to Dr. [name] more about that.

## 2012-01-05 NOTE — Telephone Encounter (Signed)
Dear [name] Team I can still see her as apatient if the family would like--but they will have to bring her HERE---just like before when she was at home. If they cannot get her here, then they will either have to wait until they can and reschedule. If it does not look like they will EVER be able to get her here, then they probably need the doctor for [facility] to take over all of her care.Please let us know. We want to do what is best for the family and Maria Gaines! [name]

## 2012-01-06 ENCOUNTER — Ambulatory Visit: Payer: Medicare Other | Admitting: Family Medicine

## 2012-01-11 ENCOUNTER — Telehealth: Payer: Self-pay | Admitting: Family Medicine

## 2012-01-11 ENCOUNTER — Emergency Department (HOSPITAL_COMMUNITY)
Admission: EM | Admit: 2012-01-11 | Discharge: 2012-01-11 | Disposition: A | Discharge status: Home / Self Care | Payer: Medicare Other | Attending: Emergency Medicine | Admitting: Emergency Medicine

## 2012-01-11 DIAGNOSIS — F411 Generalized anxiety disorder: Secondary | ICD-10-CM | POA: Insufficient documentation

## 2012-01-11 DIAGNOSIS — R0602 Shortness of breath: Secondary | ICD-10-CM | POA: Insufficient documentation

## 2012-01-11 NOTE — ED Notes (Signed)
Sob and having anxiety attack x 30 mins ago

## 2012-01-11 NOTE — ED Notes (Signed)
Just out of [facility] on sat and went back home to be w/ husband  Today things do not seem right

## 2012-01-11 NOTE — Telephone Encounter (Signed)
Clinical Social Worker (CSW) contacted [facility] and confirmed that pt dc'ed on Saturday, Jan 09, 2012 after completing 3 weeks of therapy. Facility stated they set up [facility] services for pt through [facility] [phone]. CSW contacted [facility] and confirmed PT/OT has been setup for pt and will begin tomorrow, Jan 12, 2012. CSW left a vm for pt daughter Early Osmond and pt spouse asking to contact CSW.  Theresia Bough, MSW, Theresia Majors [phone]

## 2012-01-11 NOTE — Telephone Encounter (Signed)
Clinical Social Worker (CSW) spoke with pt daughter who was requesting 24hr care for pt. CSW informed daughter that family would have to pay privately to hire someone to be with pt 24hr a day. CSW also informed daughter that [facility] will not be going to pt home as only HHPT/OT was ordered by facility. CSW will request for pt PCP to send a script for [facility] Social Work and Charity fundraiser to [facility]. CSW also informed daughter that CSW will email her a list of private duty agencies. CSW also suggested she contact [facility] to see if pt can return as daughter states family is having a difficult time caring for her. CSW also informed pt that they need to apply for Medicaid asap as this is the only insurance that will cover LT placement. Daughter will contact [facility] and follow up with CSW.  Theresia Bough, MSW, Theresia Majors [phone]
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
## 2012-01-11 NOTE — Telephone Encounter (Signed)
Just got out of [facility] on Saturday and they were told that a nurse would come out to the house to see her.  No one has come out yet and they don't think that she should have been released.  She has an appt with Dr [name] this Wednesday, but would like to talk to SW to see what their options are.

## 2012-01-12 NOTE — Telephone Encounter (Signed)
Edd Fabian for the help. How do I send the requested RX for [facility] SW and RN to [facility]? THANKS! [name]

## 2012-01-12 NOTE — Telephone Encounter (Signed)
Hey Dr. [name]- you can fax the script to [facility] at [phone]. Thanks! Nelva Bush

## 2012-01-13 ENCOUNTER — Observation Stay (HOSPITAL_COMMUNITY)
Admission: AD | Admit: 2012-01-13 | Discharge: 2012-01-14 | Disposition: A | Discharge status: Home / Self Care | Payer: Medicare Other | Source: Ambulatory Visit | Attending: Family Medicine | Admitting: Family Medicine

## 2012-01-13 ENCOUNTER — Encounter (HOSPITAL_COMMUNITY): Payer: Self-pay | Admitting: Emergency Medicine

## 2012-01-13 ENCOUNTER — Inpatient Hospital Stay (HOSPITAL_COMMUNITY): Admission: AD | Admit: 2012-01-13 | Payer: Medicare Other | Source: Ambulatory Visit | Admitting: Family Medicine

## 2012-01-13 ENCOUNTER — Encounter: Payer: Self-pay | Admitting: Family Medicine

## 2012-01-13 ENCOUNTER — Ambulatory Visit (INDEPENDENT_AMBULATORY_CARE_PROVIDER_SITE_OTHER): Payer: Medicare Other | Admitting: Family Medicine

## 2012-01-13 ENCOUNTER — Other Ambulatory Visit: Payer: Self-pay

## 2012-01-13 ENCOUNTER — Emergency Department (HOSPITAL_COMMUNITY): Payer: Medicare Other

## 2012-01-13 VITALS — BP 144/80 | Wt 154.2 lb

## 2012-01-13 DIAGNOSIS — R079 Chest pain, unspecified: Secondary | ICD-10-CM

## 2012-01-13 DIAGNOSIS — R0789 Other chest pain: Secondary | ICD-10-CM | POA: Insufficient documentation

## 2012-01-13 DIAGNOSIS — J449 Chronic obstructive pulmonary disease, unspecified: Secondary | ICD-10-CM

## 2012-01-13 DIAGNOSIS — J4489 Other specified chronic obstructive pulmonary disease: Principal | ICD-10-CM | POA: Insufficient documentation

## 2012-01-13 DIAGNOSIS — R5381 Other malaise: Secondary | ICD-10-CM | POA: Insufficient documentation

## 2012-01-13 DIAGNOSIS — R0602 Shortness of breath: Secondary | ICD-10-CM

## 2012-01-13 DIAGNOSIS — E119 Type 2 diabetes mellitus without complications: Secondary | ICD-10-CM | POA: Insufficient documentation

## 2012-01-13 DIAGNOSIS — J441 Chronic obstructive pulmonary disease with (acute) exacerbation: Secondary | ICD-10-CM

## 2012-01-13 DIAGNOSIS — I10 Essential (primary) hypertension: Secondary | ICD-10-CM | POA: Insufficient documentation

## 2012-01-13 HISTORY — DX: Pneumonia, unspecified organism: J18.9

## 2012-01-13 HISTORY — DX: Atherosclerotic heart disease of native coronary artery without angina pectoris: I25.10

## 2012-01-13 LAB — CBC
HCT: 38.6 % (ref 36.0–46.0)
MCH: 28.9 pg (ref 26.0–34.0)
MCHC: 31.9 g/dL (ref 30.0–36.0)
MCV: 89.3 fL (ref 78.0–100.0)
Platelets: 320 10*3/uL (ref 150–400)
Platelets: 335 10*3/uL (ref 150–400)
RBC: 4.22 MIL/uL (ref 3.87–5.11)
RDW: 13.4 % (ref 11.5–15.5)
WBC: 7.2 10*3/uL (ref 4.0–10.5)

## 2012-01-13 LAB — COMPREHENSIVE METABOLIC PANEL
AST: 13 U/L (ref 0–37)
Albumin: 3.9 g/dL (ref 3.5–5.2)
Alkaline Phosphatase: 81 U/L (ref 39–117)
BUN: 12 mg/dL (ref 6–23)
Chloride: 90 mEq/L — ABNORMAL LOW (ref 96–112)
Creatinine, Ser: 0.54 mg/dL (ref 0.50–1.10)
Potassium: 5.2 mEq/L — ABNORMAL HIGH (ref 3.5–5.1)
Total Protein: 7.5 g/dL (ref 6.0–8.3)

## 2012-01-13 LAB — GLUCOSE, CAPILLARY: Glucose-Capillary: 157 mg/dL — ABNORMAL HIGH (ref 70–99)

## 2012-01-13 LAB — CARDIAC PANEL(CRET KIN+CKTOT+MB+TROPI)
Relative Index: INVALID (ref 0.0–2.5)
Relative Index: INVALID (ref 0.0–2.5)
Total CK: 39 U/L (ref 7–177)
Total CK: 44 U/L (ref 7–177)
Troponin I: <0.3 ng/mL (ref <0.30)

## 2012-01-13 MED ORDER — INSULIN ASPART 100 UNIT/ML ~~LOC~~ SOLN: 0.0000 [IU] | Freq: Every day | SUBCUTANEOUS | Status: DC

## 2012-01-13 MED ORDER — ALBUTEROL SULFATE (5 MG/ML) 0.5% IN NEBU
INHALATION_SOLUTION | RESPIRATORY_TRACT | Status: AC
  Administered 2012-01-13: 13:00:00
  Filled 2012-01-13: qty 1

## 2012-01-13 MED ORDER — BENAZEPRIL HCL 5 MG PO TABS
5.0000 mg | ORAL_TABLET | Freq: Every day | ORAL | Status: DC
  Administered 2012-01-14: 5 mg via ORAL
  Filled 2012-01-13: qty 1

## 2012-01-13 MED ORDER — PANTOPRAZOLE SODIUM 40 MG PO TBEC
40.0000 mg | DELAYED_RELEASE_TABLET | Freq: Every day | ORAL | Status: DC
  Administered 2012-01-14: 40 mg via ORAL
  Filled 2012-01-13: qty 1

## 2012-01-13 MED ORDER — MOXIFLOXACIN HCL 400 MG PO TABS
400.0000 mg | ORAL_TABLET | Freq: Every day | ORAL | Status: DC
  Administered 2012-01-13: 400 mg via ORAL
  Filled 2012-01-13: qty 1

## 2012-01-13 MED ORDER — SODIUM CHLORIDE 0.9 % IV SOLN: 250.0000 mL | INTRAVENOUS | Status: DC | PRN

## 2012-01-13 MED ORDER — ASPIRIN EC 81 MG PO TBEC
81.0000 mg | DELAYED_RELEASE_TABLET | Freq: Every day | ORAL | Status: DC
  Administered 2012-01-14: 81 mg via ORAL
  Filled 2012-01-13: qty 1

## 2012-01-13 MED ORDER — ALBUTEROL SULFATE (2.5 MG/3ML) 0.083% IN NEBU
2.5000 mg | INHALATION_SOLUTION | Freq: Once | RESPIRATORY_TRACT | Status: AC
  Administered 2012-01-13: 2.5 mg via RESPIRATORY_TRACT

## 2012-01-13 MED ORDER — HEPARIN SODIUM (PORCINE) 5000 UNIT/ML IJ SOLN: 5000.0000 [IU] | Freq: Three times a day (TID) | INTRAMUSCULAR | Status: DC

## 2012-01-13 MED ORDER — INSULIN ASPART 100 UNIT/ML ~~LOC~~ SOLN: 3.0000 [IU] | Freq: Three times a day (TID) | SUBCUTANEOUS | Status: DC

## 2012-01-13 MED ORDER — SODIUM CHLORIDE 0.9 % IJ SOLN: 3.0000 mL | INTRAMUSCULAR | Status: DC | PRN

## 2012-01-13 MED ORDER — ASPIRIN EC 81 MG PO TBEC: 81.0000 mg | DELAYED_RELEASE_TABLET | Freq: Every day | ORAL | Status: DC

## 2012-01-13 MED ORDER — DILTIAZEM HCL ER COATED BEADS 180 MG PO CP24
180.0000 mg | ORAL_CAPSULE | Freq: Every day | ORAL | Status: DC
  Administered 2012-01-14: 180 mg via ORAL
  Filled 2012-01-13: qty 1

## 2012-01-13 MED ORDER — IPRATROPIUM BROMIDE 0.02 % IN SOLN
0.5000 mg | Freq: Once | RESPIRATORY_TRACT | Status: AC
  Administered 2012-01-13: 0.5 mg via RESPIRATORY_TRACT

## 2012-01-13 MED ORDER — HEPARIN SODIUM (PORCINE) 5000 UNIT/ML IJ SOLN
5000.0000 [IU] | Freq: Three times a day (TID) | INTRAMUSCULAR | Status: DC
  Administered 2012-01-13 – 2012-01-14 (×3): 5000 [IU] via SUBCUTANEOUS
  Filled 2012-01-13 (×5): qty 1

## 2012-01-13 MED ORDER — ALBUTEROL SULFATE (5 MG/ML) 0.5% IN NEBU: 2.5000 mg | INHALATION_SOLUTION | RESPIRATORY_TRACT | Status: AC | PRN

## 2012-01-13 MED ORDER — ASPIRIN 325 MG PO TABS
325.0000 mg | ORAL_TABLET | Freq: Once | ORAL | Status: AC
  Administered 2012-01-13: 325 mg via ORAL

## 2012-01-13 MED ORDER — BIOTENE DRY MOUTH MT LIQD
15.0000 mL | Freq: Two times a day (BID) | OROMUCOSAL | Status: DC
  Administered 2012-01-13 – 2012-01-14 (×2): 15 mL via OROMUCOSAL

## 2012-01-13 MED ORDER — HEPARIN SODIUM (PORCINE) 5000 UNIT/ML IJ SOLN
5000.0000 [IU] | Freq: Three times a day (TID) | INTRAMUSCULAR | Status: DC
  Filled 2012-01-13 (×3): qty 1

## 2012-01-13 MED ORDER — DOXYCYCLINE HYCLATE 100 MG PO TABS
100.0000 mg | ORAL_TABLET | Freq: Two times a day (BID) | ORAL | Status: DC
  Administered 2012-01-13 – 2012-01-14 (×2): 100 mg via ORAL
  Filled 2012-01-13 (×3): qty 1

## 2012-01-13 MED ORDER — CLOPIDOGREL BISULFATE 75 MG PO TABS
75.0000 mg | ORAL_TABLET | Freq: Every day | ORAL | Status: DC
  Administered 2012-01-14: 75 mg via ORAL
  Filled 2012-01-13: qty 1

## 2012-01-13 MED ORDER — FLUTICASONE-SALMETEROL 250-50 MCG/DOSE IN AEPB
1.0000 | INHALATION_SPRAY | Freq: Two times a day (BID) | RESPIRATORY_TRACT | Status: DC
  Administered 2012-01-13 – 2012-01-14 (×2): 1 via RESPIRATORY_TRACT
  Filled 2012-01-13: qty 14

## 2012-01-13 MED ORDER — CARVEDILOL 6.25 MG PO TABS
6.2500 mg | ORAL_TABLET | Freq: Two times a day (BID) | ORAL | Status: DC
  Administered 2012-01-13 – 2012-01-14 (×3): 6.25 mg via ORAL
  Filled 2012-01-13 (×4): qty 1

## 2012-01-13 MED ORDER — IPRATROPIUM BROMIDE 0.02 % IN SOLN: 0.5000 mg | RESPIRATORY_TRACT | Status: DC

## 2012-01-13 MED ORDER — INSULIN ASPART 100 UNIT/ML ~~LOC~~ SOLN: 0.0000 [IU] | Freq: Three times a day (TID) | SUBCUTANEOUS | Status: DC

## 2012-01-13 MED ORDER — ACETAMINOPHEN 650 MG RE SUPP: 650.0000 mg | Freq: Four times a day (QID) | RECTAL | Status: DC | PRN

## 2012-01-13 MED ORDER — INSULIN ASPART 100 UNIT/ML ~~LOC~~ SOLN
0.0000 [IU] | Freq: Three times a day (TID) | SUBCUTANEOUS | Status: DC
  Administered 2012-01-14: 3 [IU] via SUBCUTANEOUS
  Administered 2012-01-14: 5 [IU] via SUBCUTANEOUS

## 2012-01-13 MED ORDER — QUETIAPINE FUMARATE 25 MG PO TABS
25.0000 mg | ORAL_TABLET | Freq: Every day | ORAL | Status: DC
  Administered 2012-01-13: 25 mg via ORAL
  Filled 2012-01-13 (×3): qty 1

## 2012-01-13 MED ORDER — PREDNISONE 50 MG PO TABS
60.0000 mg | ORAL_TABLET | Freq: Every day | ORAL | Status: DC
  Filled 2012-01-13 (×45): qty 1

## 2012-01-13 MED ORDER — ALBUTEROL SULFATE (5 MG/ML) 0.5% IN NEBU: 2.5000 mg | INHALATION_SOLUTION | RESPIRATORY_TRACT | Status: DC

## 2012-01-13 MED ORDER — POTASSIUM CHLORIDE CRYS ER 10 MEQ PO TBCR
10.0000 meq | EXTENDED_RELEASE_TABLET | Freq: Every day | ORAL | Status: DC
  Administered 2012-01-14: 10 meq via ORAL
  Filled 2012-01-13: qty 1

## 2012-01-13 MED ORDER — GABAPENTIN 300 MG PO CAPS
300.0000 mg | ORAL_CAPSULE | ORAL | Status: DC | PRN
Start: 2012-01-13 — End: 2012-01-14
  Filled 2012-01-13: qty 1

## 2012-01-13 MED ORDER — RIVASTIGMINE 4.6 MG/24HR TD PT24
4.6000 mg | MEDICATED_PATCH | Freq: Every day | TRANSDERMAL | Status: DC
  Administered 2012-01-14: 4.6 mg via TRANSDERMAL
  Filled 2012-01-13: qty 1

## 2012-01-13 MED ORDER — ONDANSETRON HCL 4 MG/2ML IJ SOLN: 4.0000 mg | Freq: Three times a day (TID) | INTRAMUSCULAR | Status: AC | PRN

## 2012-01-13 MED ORDER — ACETAMINOPHEN 325 MG PO TABS
650.0000 mg | ORAL_TABLET | Freq: Four times a day (QID) | ORAL | Status: DC | PRN
  Administered 2012-01-14: 650 mg via ORAL
  Filled 2012-01-13: qty 2

## 2012-01-13 MED ORDER — DEXAMETHASONE SODIUM PHOSPHATE 4 MG/ML IJ SOLN
4.0000 mg | Freq: Once | INTRAMUSCULAR | Status: AC
  Administered 2012-01-13: 4 mg via INTRAMUSCULAR

## 2012-01-13 MED ORDER — SODIUM CHLORIDE 0.9 % IJ SOLN
3.0000 mL | Freq: Two times a day (BID) | INTRAMUSCULAR | Status: DC
  Administered 2012-01-13 – 2012-01-14 (×2): 3 mL via INTRAVENOUS

## 2012-01-13 MED ORDER — MOXIFLOXACIN HCL 400 MG PO TABS
400.0000 mg | ORAL_TABLET | Freq: Every day | ORAL | Status: DC
  Filled 2012-01-13: qty 1

## 2012-01-13 MED ORDER — LORAZEPAM 1 MG PO TABS
1.0000 mg | ORAL_TABLET | Freq: Once | ORAL | Status: AC
  Administered 2012-01-13: 1 mg via ORAL
  Filled 2012-01-13: qty 1

## 2012-01-13 MED ORDER — LORAZEPAM 0.5 MG PO TABS
1.0000 mg | ORAL_TABLET | Freq: Three times a day (TID) | ORAL | Status: DC
Start: 2012-01-13 — End: 2012-01-14
  Administered 2012-01-13 – 2012-01-14 (×3): 1 mg via ORAL
  Filled 2012-01-13 (×3): qty 2

## 2012-01-13 MED ORDER — ATORVASTATIN CALCIUM 80 MG PO TABS
80.0000 mg | ORAL_TABLET | Freq: Every day | ORAL | Status: DC
  Administered 2012-01-14: 80 mg via ORAL
  Filled 2012-01-13: qty 1

## 2012-01-13 NOTE — ED Notes (Signed)
Pt has bed on 4700

## 2012-01-13 NOTE — ED Notes (Signed)
Pt was at PCP office when she had sudden onset of SOB. Pt has hx of COPD and is home O2 2L 24hr/day. Pt was given albuterol nebulizer tx by EMS, states she feels much better now.

## 2012-01-13 NOTE — Progress Notes (Signed)
Patient ID: Maria Gaines, female   DOB: 10/04/1950, 61 y.o.   MRN: [id number] Pt here for f/u from her recent stay at NH---she is here with her (ex)daughter in law [name] and her granddaughter [name]. She is extremely SOB, initially confused, her complexion is gray. She was on RA at the time of initial pulsox which was 47%. After we put her on 2 L nasal cannula oxygen her pulse ox improved to 74%. Her exam revealed bilateral faint expiratory wheezes in the a lot of air movement. She was working to breathe. Her mentation improved after the addition of oxygen and she started complaining of acute chest pain substernal nonradiating. As Ardee in the process of giving her 4 mg of intramuscular Decadron which I completed. Meconium S. issues transported to the ED. We spoke with Dr. Clarene Duke at the emergency department letter noted that when she was stabilized we would like him to call inpatient service for admission to our service.

## 2012-01-13 NOTE — Progress Notes (Signed)
Cosign for Rashida Haney RN assessment, med admin, I&O, and notes 

## 2012-01-13 NOTE — H&P (Signed)
Family Medicine Teaching [facility] Admission History and Physical  Patient name: Maria Gaines Medical record number: [id number] Date of birth: May 22, 1951 Age: 61 y.o. Gender: female  Primary Care Provider: [name], MD, MD  Chief Complaint:   COPD Exacerbation Pleuritic CP Likely placement   History of Present Illness: Maria Gaines is a 61 y.o. year old female presenting with COPD exacerbation x 1 day. Pt was found by grandchildren with increased WOB and SOB at home. Baseline hx/o O2 dependent COPD at home as well as dementia. Was brought in to be seen by PCP. Was found to be persistently hypoxic as low as into 20s today as well as confused. Pt received decadron 4 as well as duoneb. Remained persistently hypoxic. Also developed some pleuritic CP while in clinic. Pain worse with deep breathing. Increased WOB minimally improved with decadron and duoneb. Pt admittted for COPD exacerbation and likely placement.   Patient Active Problem List  Diagnoses  . BREAST CANCER  . DIABETES MELLITUS II, UNCOMPLICATED  . HYPERCHOLESTEROLEMIA  . BIPOLAR DISORDER  . PANIC ATTACKS  . HYPERTENSION, BENIGN SYSTEMIC  . CORONARY, ARTERIOSCLEROSIS  . CHF - EJECTION FRACTION < 50%  . ALLERGIC RHINITIS  . CHRONIC OBSTRUCTIVE PULMONARY DISEASE, ACUTE EXACERBATION  . COPD  . GASTROESOPHAGEAL REFLUX, NO ESOPHAGITIS  . BACK PAIN, CHRONIC, INTERMITTENT  . INSOMNIA  . OXYGEN-USE OF SUPPLEMENTAL  . Memory change  . Confusion  . Presence of stent in Proximal Right Coronary Artery   . Vertigo  . Delusions  . Paranoia  . Rapidly progressive dementia  . Dehydration  . Dementia   Past Medical History: Past Medical History  Diagnosis Date  . Visual field defect 03/09/11    Visual field defect right eye. Dr Cammy Copa optomotrist. plans rechck 6 m  . Hypertension   . Diabetes mellitus   . COPD (chronic obstructive pulmonary disease)   . Breast cancer 2003  . Vertigo   . Shortness of breath   .  Asthma     per patient  . GERD (gastroesophageal reflux disease)     Past Surgical History: Past Surgical History  Procedure Date  . Tubal ligation   . Coronary angioplasty with stent placement 11/30/2003     [name], M.D, drug eluting stent     Social History: History   Social History  . Marital Status: Married    Spouse Name: N/A    Number of Children: N/A  . Years of Education: N/A   Social History Main Topics  . Smoking status: Former Smoker -- 1.5 packs/day for 50 years    Types: Cigarettes    Quit date: 08/31/2000  . Smokeless tobacco: Never Used  . Alcohol Use: No  . Drug Use: No  . Sexually Active: Not Currently    Birth Control/ Protection: Post-menopausal   Other Topics Concern  . Not on file   Social History Narrative  . No narrative on file    Family History: No family history on file.  Allergies: Allergies  Allergen Reactions  . Sertraline     Psychosis/mania  . Codeine Other (See Comments)    "unclear of where i was at and who anyone was"  . Zolpidem Tartrate Other (See Comments)     sleepwalking    Current Facility-Administered Medications  Medication Dose Route Frequency Provider Last Rate Last Dose  . albuterol (PROVENTIL) (2.5 MG/3ML) 0.083% nebulizer solution 2.5 mg  2.5 mg Nebulization Once Nestor Ramp, MD  2.5 mg at 01/13/12 1157  . albuterol (PROVENTIL) (5 MG/ML) 0.5% nebulizer solution 2.5 mg  2.5 mg Nebulization Q4H Floydene Flock, MD      . dexamethasone (DECADRON) injection 4 mg  4 mg Intramuscular Once Nestor Ramp, MD   4 mg at 01/13/12 1145  . heparin injection 5,000 Units  5,000 Units Subcutaneous Q8H Floydene Flock, MD      . insulin aspart (novoLOG) injection 0-5 Units  0-5 Units Subcutaneous QHS Floydene Flock, MD      . insulin aspart (novoLOG) injection 0-9 Units  0-9 Units Subcutaneous TID WC Floydene Flock, MD      . insulin aspart (novoLOG) injection 3 Units  3 Units Subcutaneous TID WC Floydene Flock, MD       . ipratropium (ATROVENT) nebulizer solution 0.5 mg  0.5 mg Nebulization Once Nestor Ramp, MD   0.5 mg at 01/13/12 1158  . ipratropium (ATROVENT) nebulizer solution 0.5 mg  0.5 mg Nebulization Q4H Floydene Flock, MD       Current Outpatient Prescriptions  Medication Sig Dispense Refill  . acetaminophen (TYLENOL) 500 MG tablet Take 1,000 mg by mouth every 6 (six) hours as needed. For pain      . aspirin EC 81 MG tablet Take 81 mg by mouth daily.      Marland Kitchen atorvastatin (LIPITOR) 80 MG tablet Take 80 mg by mouth daily.      . benazepril (LOTENSIN) 5 MG tablet Take 1 tablet (5 mg total) by mouth daily.  30 tablet  11  . carvedilol (COREG) 6.25 MG tablet Take 6.25 mg by mouth 2 (two) times daily with a meal.       . clopidogrel (PLAVIX) 75 MG tablet Take 75 mg by mouth daily.        Marland Kitchen diltiazem (CARTIA XT) 180 MG 24 hr capsule Take 1 capsule (180 mg total) by mouth daily.  90 capsule  3  . Fluticasone-Salmeterol (ADVAIR) 250-50 MCG/DOSE AEPB Inhale 1 puff into the lungs every 12 (twelve) hours.      . gabapentin (NEURONTIN) 300 MG capsule Take 300 mg by mouth as needed. For leg pain      . LORazepam (ATIVAN) 2 MG tablet Take 0.5 tablets (1 mg total) by mouth 3 (three) times daily.  90 tablet  5  . metFORMIN (GLUCOPHAGE) 1000 MG tablet Take 1,000 mg by mouth 2 (two) times daily with a meal.      . omeprazole (PRILOSEC) 40 MG capsule Take 40 mg by mouth daily.      . potassium chloride (K-DUR,KLOR-CON) 10 MEQ tablet Take 10 mEq by mouth daily.      . QUEtiapine (SEROQUEL) 25 MG tablet Take 25 mg by mouth at bedtime.      . rivastigmine (EXELON) 4.6 mg/24hr Place 1 patch (4.6 mg total) onto the skin daily.  30 patch  1  . DISCONTD: ATROVENT HFA 17 MCG/ACT inhaler Inhale 1 puff into the lungs 2 (two) times daily.        Review Of Systems: See HPI, otherwise ROS negative.  Physical Exam: There were no vitals filed for this visit.  General: cooperative and increased WOB, mild distress, able to speak  in near full sentences HEENT: PERRLA and extra ocular movement intact Heart: S1, S2 normal, no murmur, rub or gallop, regular rate and rhythm, + TTP along anterior chest wall.  Lungs: + expiratory wheezes in apices bilaterally Abdomen: abdomen is soft without  significant tenderness, masses, organomegaly or guarding Extremities: extremities normal, atraumatic, no cyanosis or edema Skin:no rashes Neurology: normal without focal findings  Labs and Imaging: Lab Results  Component Value Date/Time   NA 138 12/18/2011  6:25 AM   K 3.7 12/18/2011  6:25 AM   CL 94* 12/18/2011  6:25 AM   CO2 38* 12/18/2011  6:25 AM   BUN 5* 12/18/2011  6:25 AM   CREATININE 0.52 12/18/2011  6:25 AM   CREATININE 0.62 11/09/2011  2:28 PM   GLUCOSE 134* 12/18/2011  6:25 AM   Lab Results  Component Value Date   WBC 7.1 12/18/2011   HGB 11.7* 12/18/2011   HCT 36.6 12/18/2011   MCV 90.4 12/18/2011   PLT 281 12/18/2011    Assessment and Plan: Maria Gaines is a 61 y.o. year old female presenting with COPD exacerbation.   COPD: s/p decadron and duoneb. Will continue with steroids and bronchodialtors. Start avelox. CXR to eval for PNA vs. Cardiomegaly. Supplemental O2. General lab work. If leukocytosis present, likely from steroids.   CP: Pleuritic in nature. ASA in clinic, though CP non cardiac in nature.   HTN: Continue home meds.   DM: Hold metformin. SSI.   FEN/GI: Card modified diet.  Prophylaxis: subq heparin Disposition: Pt with noted progressive deterioration ovee last 3-6 months. In discussion with Dr. [name], pt may be a likely placement.

## 2012-01-13 NOTE — Progress Notes (Signed)
Pt admitted to 4730.  Pt assessed, alert and oriented, vital signs taken and are stable, pt on tele. Pt in no distress and complains of no pain. Will continue to monitor.

## 2012-01-13 NOTE — ED Provider Notes (Signed)
12:50 PM  HPI Reports SOB and COPD exacerbations daily. Patient was sent by [facility] for evaluation due to acute SOB and hypoxia in the clinic. Pt received solumedrol and albuterol and reports that she feels fine and wants to be d/ced. Dr. Alvester Morin, [employer] [facility] apparently wanted patient admitted for placement due to worsening symptoms over several weeks. D/c this with patient and family who agree with plan.   ROS Review of Systems  Constitutional: Negative for fever and chills.  HENT: Negative for neck pain.   Respiratory: Positive for cough, shortness of breath and wheezing.   Cardiovascular: Positive for chest pain. Negative for orthopnea.  Gastrointestinal: Negative for heartburn, nausea, vomiting and abdominal pain.  All other systems reviewed and are negative.    Physical Exam  Constitutional: She is well-developed, well-nourished, and in no distress. No distress.  HENT:  Head: Normocephalic and atraumatic.  Eyes: Pupils are equal, round, and reactive to light. Left eye exhibits no discharge.  Neck: Normal range of motion. Neck supple.  Cardiovascular: Normal rate, regular rhythm and normal heart sounds.  Exam reveals no gallop and no friction rub.   No murmur heard. Pulmonary/Chest: Effort normal and breath sounds normal. No respiratory distress. She has no wheezes (Cleared after coughing). She has no rales. She exhibits no tenderness.  Abdominal: Soft. Bowel sounds are normal. She exhibits no distension and no mass. There is no tenderness. There is no rebound and no guarding.  Lymphadenopathy:    She has no cervical adenopathy.  Skin: Skin is warm and dry. She is not diaphoretic.  Psychiatric: Affect and judgment normal.    MDM 2:12 PM Spoke with family practice, Dr. Alvester Morin. Recommends patient admission but requested patient have evaluation in the emergency room to due to acute shortness of breath that occurred while in the clinic. Otherwise will like to have patient admitted for placement  due to worsening signs of COPD.  Spoke with Dr. Tye Savoy who will see the patient for admission. Pt already has a room available in hospital.  Thomasene Lot, PA-C 01/13/12 1510

## 2012-01-13 NOTE — H&P (Signed)
FMTS Attending Admission Note: Dayten Juba MD [phone] pager office [phone] I  have seen and examined this patient, reviewed their chart. I have discussed this patient with the resident. I agree with the resident's findings, assessment and care plan. 

## 2012-01-13 NOTE — Progress Notes (Signed)
**Note Maria-Identified via Obfuscation** Patient ID: Maria Gaines, female   DOB: 1950/12/31, 61 y.o.   MRN: [id number]  FPTS INTERIM NOTE: Patient seen and examined by Dr. Berline Chough and myself.  In ED, patient was sitting in bed comfortably ans asking for food.  She denies any chest pain at this time.  Patient says she has difficulty breathing "all the time."  Patient cannot recall the events that occurred in our clinic today.    PHYSICAL EXAM: Gen:  AAO x 3.   Head/neck: no JVD, supple, full ROM.   Cards: RRR, no murmur.   Resp: poor air movement, scattered rhonchi RLL, no wheezing appreciated due to poor air entry Abdo: soft, non-tender, active BS MSK: no C/C/E  ASSESSMENT/PLAN: Maria Gaines is a 61 y.o. year old female presenting with increased WOB, hypoxic episode in our clinic today:  # COPD: s/p decadron and duoneb.  CXR negative for acute process. -Continue with steroids and bronchodialtors.  -Start avelox. -Incentive spirometry -Maintain sats 92% over RA  # CP: No chest pain  ASA in clinic, though CP non cardiac in nature.  -Cycle cardiac enzymes  # HTN: Continue home meds.  # DM: Hold metformin. SSI.  # FEN/GI: Card modified diet.  # Prophylaxis: subq heparin  # Disposition: Pt with noted progressive deterioration ovee last 3-6 months. In discussion with Dr. [name], pt may be a likely placement.   Maria LA CRUZ,Zadkiel Dragan DO 01/11/12 @ 1654  FMTS Attending Admit Note Patient seen and examined in ED, discussed with resident team.  Patient is alone without family present at bedside.  She appears quite comfortable, denies dyspnea, chest discomfort, or cough. Is on her home oxygen supplementation of 2L/min, moving air well.  Pulse oximetry mid-90s.  No rales or wheezes on lung exam; no JVD or peripheral edema.  Plan to admit to observation for apparent acute COPD exacerbation, on telemetry. Steroids, abx, oxygen supplementation, which is at her baseline at present.  Will need to discuss with patient/ family regarding  disposition plan, (ie, whether plan is for SNF placement, or return to home).  Paula Compton, MD  01/13/2012  16:50pm

## 2012-01-14 LAB — CBC
HCT: 36.6 % (ref 36.0–46.0)
MCV: 89.9 fL (ref 78.0–100.0)
RDW: 13.4 % (ref 11.5–15.5)
WBC: 7.7 10*3/uL (ref 4.0–10.5)

## 2012-01-14 LAB — COMPREHENSIVE METABOLIC PANEL
Albumin: 3.4 g/dL — ABNORMAL LOW (ref 3.5–5.2)
BUN: 9 mg/dL (ref 6–23)
CO2: 38 mEq/L — ABNORMAL HIGH (ref 19–32)
Chloride: 92 mEq/L — ABNORMAL LOW (ref 96–112)
Creatinine, Ser: 0.52 mg/dL (ref 0.50–1.10)
GFR calc non Af Amer: >90 mL/min (ref >90)
Glucose, Bld: 249 mg/dL — ABNORMAL HIGH (ref 70–99)
Total Bilirubin: 0.2 mg/dL — ABNORMAL LOW (ref 0.3–1.2)

## 2012-01-14 LAB — GLUCOSE, CAPILLARY
Glucose-Capillary: 243 mg/dL — ABNORMAL HIGH (ref 70–99)
Glucose-Capillary: 184 mg/dL — ABNORMAL HIGH (ref 70–99)

## 2012-01-14 LAB — CARDIAC PANEL(CRET KIN+CKTOT+MB+TROPI)
Relative Index: INVALID (ref 0.0–2.5)
Troponin I: <0.3 ng/mL (ref <0.30)

## 2012-01-14 NOTE — Discharge Planning (Deleted)
Family Medicine Resident Discharge Summary  Patient ID: Maria Gaines [id number] 61 y.o. Jun 05, 1951  Admit date: 01/13/2012  Discharge date and time: 01/14/12 14:54 to [facility]  Admitting Physician: Barbaraann Barthel, MD   Discharge Physician: Barbaraann Barthel, MD  Admission Diagnoses: COPD (chronic obstructive pulmonary disease) [496] SOB  Discharge Diagnoses: COPD, SOB, Hypoxemia  Admission Condition: fair  Discharged Condition: good  Indication for Admission: Hypoxemia to 47%  Hospital Course: Pt admitted on 01/13/12 after O2 sats in clinic noted to be 47% and cyanotic appearing. Pt given Albuterol treatment x1 and placed on O2 w/ return of O2 sats. Pt remained on home O2 of 2L [place] w/ Sats above 90% and normal respiratory effort. Pt comfortable and stating she feels back to herself this morning 01/14/12. Respiratory effort normal w/ stable Sats and mild end expiratory wheezes on exam. Pt and husband desiring to go home as opposed to transfer to SNF. PT evaluated pt and agreed w/ sending pt home instead of SNF.  Of note pt arrived in clinic thinking her O2 was on when it actually was in place but not on. Clinic staff turned on O2 w/ improvement in sats.  Consults: none  Significant Diagnostic Studies: none  Treatments: respiratory therapy: oxygen and Albuterol.  Discharge Exam: BP 114/78  Pulse 87  Temp(Src) 97.7 F (36.5 C) (Oral)  Resp 20  Ht 5\' 2"  (1.575 m)  Wt 147 lb 14.9 oz (67.1 kg)  BMI 27.06 kg/m2  SpO2 97%   Gen: WNWD, no distress CV: RRR RES: Mild end expiratory wheezes bilat., normal effort, 2L [place] Ext: No edema, 2+ pulses  Disposition: home  Patient Instructions:  Current Discharge Medication List    CONTINUE these medications which have NOT CHANGED   Details  acetaminophen (TYLENOL) 500 MG tablet Take 1,000 mg by mouth every 6 (six) hours as needed. For pain    aspirin EC 81 MG tablet Take 81 mg by mouth daily.    atorvastatin (LIPITOR) 80 MG  tablet Take 80 mg by mouth daily.    benazepril (LOTENSIN) 5 MG tablet Take 1 tablet (5 mg total) by mouth daily. Qty: 30 tablet, Refills: 11    carvedilol (COREG) 6.25 MG tablet Take 6.25 mg by mouth 2 (two) times daily with a meal.     clopidogrel (PLAVIX) 75 MG tablet Take 75 mg by mouth daily.      diltiazem (CARTIA XT) 180 MG 24 hr capsule Take 1 capsule (180 mg total) by mouth daily. Qty: 90 capsule, Refills: 3    Fluticasone-Salmeterol (ADVAIR) 250-50 MCG/DOSE AEPB Inhale 1 puff into the lungs every 12 (twelve) hours.    gabapentin (NEURONTIN) 300 MG capsule Take 300 mg by mouth as needed. For leg pain    LORazepam (ATIVAN) 2 MG tablet Take 0.5 tablets (1 mg total) by mouth 3 (three) times daily. Qty: 90 tablet, Refills: 5   Comments: 1 mg tab twice a day and 2 mg before bed.    metFORMIN (GLUCOPHAGE) 1000 MG tablet Take 1,000 mg by mouth 2 (two) times daily with a meal.    omeprazole (PRILOSEC) 40 MG capsule Take 40 mg by mouth daily.    potassium chloride (K-DUR,KLOR-CON) 10 MEQ tablet Take 10 mEq by mouth daily.    QUEtiapine (SEROQUEL) 25 MG tablet Take 25 mg by mouth at bedtime.    rivastigmine (EXELON) 4.6 mg/24hr Place 1 patch (4.6 mg total) onto the skin daily. Qty: 30 patch, Refills: 1  Activity: activity as tolerated and ambulate in house Diet: regular diet Wound Care: none needed  Follow-up with Dr. [name] on 02/03/12 at 10:45  Pt to be discharged to SNF in [place] on 01/14/12  Follow-up Items: 1. COPD medications. Pt reports only using albuterol twice monthly and never using her prescribed controller medications. 2. Consider changing Carvedilol for cardioselective bblocker such as metoprolol 3. Consider DC Plavix  Signed: Shelly Flatten, MD Family Medicine Resident PGY-1 [phone] 01/14/2012 12:16 PM

## 2012-01-14 NOTE — Discharge Instructions (Signed)
You are doing very well. Please continue to use your oxygen day and night. If you feel yourself becoming short of breath please turn up your oxygen, if your symptoms do not improve, please call the clinic to speak to a doctor [phone]). Please follow up with Dr. [name] on 02/03/12 at 10:45am. Have a great day.

## 2012-01-14 NOTE — Progress Notes (Signed)
Clinical Child psychotherapist (CSW) was informed by facility that they received authorization from pt insurance. CSW contacted pt husband who was agreeable for dc and CSW also contacted pt grand daughter Neysa Bonito [phone] who will be signing pt in at the facility [phone]. CSW spoke with pt who appeared to be very excited and stated she was appreciative of placement. CSW placed dc packet in shadow chart, included dc summary in TLC and contacted [facility] for transport. No further CSW needs.  CSW signing off.  Theresia Bough, MSW, Theresia Majors [phone]

## 2012-01-14 NOTE — ED Provider Notes (Signed)
Medical screening examination/treatment/procedure(s) were performed by non-physician practitioner and as supervising physician I was immediately available for consultation/collaboration.  Jaziah Kwasnik, MD 01/14/12 1553 

## 2012-01-14 NOTE — Progress Notes (Signed)
UR Completed. Tristina Sahagian, RN, Nurse Case Manager [phone]     

## 2012-01-14 NOTE — Progress Notes (Signed)
Cosign for Maria Haney RN assessment, med admin, I&O, and notes 

## 2012-01-14 NOTE — Evaluation (Addendum)
Physical Therapy Evaluation and Discharge  Patient Details Name: Maria Gaines MRN: [id number] DOB: 1950/09/07 Today's Date: 01/14/2012 Time: 2956-2130 (session interupted by phlebotomy for blood draw) PT Time Calculation (min): 40 min  PT Assessment / Plan / Recommendation Clinical Impression  MALKA BOCEK is a 61 y.o. year old female presenting with increased SOB.  Pt with noted progressive deterioration ovee last 3-6 months.  Is on her home oxygen supplementation of 2L/min.  Pt discharged from SNF on 01/09/12 after 3 weeks of rehab.  Today pt demonstrates safety and independence with all mobilty and presents with no further PT needs.              PT Assessment  Patent does not need any further PT services    Follow Up Recommendations  No PT follow up    Barriers to Discharge        lEquipment Recommendations  None recommended by PT    Recommendations for Other Services     Frequency      Precautions / Restrictions Precautions Precautions: Fall Restrictions Weight Bearing Restrictions: No   Pertinent Vitals/Pain No c/o pain.      Mobility  Bed Mobility Bed Mobility: Sitting - Scoot to Edge of Bed;Sit to Sidelying Right;Right Sidelying to Sit Right Sidelying to Sit: 6: Modified independent (Device/Increase time) Sitting - Scoot to Edge of Bed: 6: Modified independent (Device/Increase time) Sit to Sidelying Right: 6: Modified independent (Device/Increase time) Transfers Transfers: Sit to Stand;Stand to Sit;Stand Pivot Transfers Sit to Stand: 7: Independent;From bed;From toilet;With upper extremity assist Stand to Sit: 7: Independent Stand Pivot Transfers: 7: Independent Details for Transfer Assistance: Pt demonstrates safety and independence.   Ambulation/Gait Ambulation/Gait Assistance: 7: Independent Ambulation Distance (Feet): 150 Feet Assistive device: None Gait Pattern: Within Functional Limits Gait velocity: a little slow but functional. Stairs: No    Exercises     PT Diagnosis:    PT Problem List:   PT Treatment Interventions:     PT Goals    Visit Information       Subjective Data  Subjective: I think it would be a good idea to go back to [facility].     Prior Functioning  Home Living Lives With: Spouse (Recently diagnosed with Cancer) Available Help at Discharge: Family Type of Home: House Home Access: Stairs to enter Secretary/administrator of Steps: 5 Home Layout: One level Bathroom Shower/Tub: Forensic scientist: Standard Bathroom Accessibility: Yes How Accessible: Accessible via walker Home Adaptive Equipment: Bedside commode/3-in-1;Shower chair with back Prior Function Level of Independence: Independent Able to Take Stairs?: Yes Driving: No Vocation: Retired Musician: No difficulties Dominant Hand: Right    Cognition  Overall Cognitive Status: History of cognitive impairments - at baseline Arousal/Alertness: Awake/alert Orientation Level: Appears intact for tasks assessed Behavior During Session: [facility] for tasks performed    Extremity/Trunk Assessment Right Upper Extremity Assessment RUE ROM/Strength/Tone: Within functional levels Left Upper Extremity Assessment LUE ROM/Strength/Tone: Within functional levels Right Lower Extremity Assessment RLE ROM/Strength/Tone: Within functional levels Left Lower Extremity Assessment LLE ROM/Strength/Tone: Within functional levels Trunk Assessment Trunk Assessment: Normal   Balance Balance Balance Assessed: Yes Static Standing Balance Static Standing - Balance Support: No upper extremity supported Static Standing - Level of Assistance: 5: Stand by assistance Static Standing - Comment/# of Minutes: 1 minute with no UE support eyes open no LOB mild increase in A-P sway.  End of Session PT - End of Session Equipment Utilized During Treatment: Gait belt Activity  Tolerance: Patient tolerated treatment well;Other (comment)  (O2 sats >90 throughout session.  HR as high as 129 ) Patient left: in chair;with call bell/phone within reach Nurse Communication: Mobility status   Trapper Meech 01/14/2012, 9:08 AM Theron Arista L. Jakhi Dishman DPT [phone]

## 2012-01-14 NOTE — Progress Notes (Signed)
Clinical Child psychotherapist (CSW) contacted pt daughter to inform her that pt has received a bed offer at [facility] (per family requesting placement in [place].) Daughter stated she was agreeable to placement however CSW was unable to reach pt spouse. During assessment CSW did confirm from spouse that he would prefer placement in the [facility] area as pt would be closer to her children and grand children. CSW will still wait to confirm with spouse.  CSW reminded daughter that the facility will need to receive approval from pt insurance and if pt is denied pt will not be able to go to SNF with insurance. Daughter and pt spouse aware and during assessment stated they were agreeable to take pt home if insurance denied placement.  CSW also confirmed with Toniann Fail from [facility] that they will accept pt with her current 30day pasarr expiring Jan 20, 2012 and they will resubmit for an updated pasarr when pt arrives.  CSW will await confirmation from facility.  Theresia Bough, MSW, Theresia Majors [phone]

## 2012-01-14 NOTE — Progress Notes (Signed)
IV d/c'd, Tele d/c'd. D/c instructions and medications sent in discharge packet with patient to facility. Pt transported via EMS. Pt D/c'd to [facility].

## 2012-01-14 NOTE — Discharge Planning (Signed)
FMTS Attending Note  Patient seen and examined by me, discussed with resident team.  She continues to appear quite comfortable, not exhibiting increased work of breathing and denying dyspnea.  She denies chest pain as well.  Has had an uneventful course since her admission yesterday afternoon.  She and husband (at bedside) both anticipate being discharged to home.   From prior reports, it sounds as if there has been some family coordination to have her transferred to a SNF in [place].  Will discuss with CSW regarding disposition at time of hospital discharge.  The patient appears to be medically appropriate for discharge at this time.  Paula Compton, MD

## 2012-01-14 NOTE — Clinical Social Work Placement (Signed)
    Clinical Social Work Department CLINICAL SOCIAL WORK PLACEMENT NOTE 01/14/2012  Patient:  Maria Gaines, Maria Gaines  Account Number:  [id number] Admit date:  01/13/2012  Clinical Social Worker:  Theresia Bough, Theresia Majors  Date/time:  01/14/2012 11:45 AM  Clinical Social Work is seeking post-discharge placement for this patient at the following level of care:   SKILLED NURSING   (*CSW will update this form in Epic as items are completed)   01/14/2012  Patient/family provided with [facility] Department of Clinical Social Work's list of facilities offering this level of care within the geographic area requested by the patient (or if unable, by the patient's family).    Patient/family informed of their freedom to choose among providers that offer the needed level of care, that participate in Medicare, Medicaid or managed care program needed by the patient, have an available bed and are willing to accept the patient.    Patient/family informed of [facility]' ownership interest in [facility], as well as of the fact that they are under no obligation to receive care at this facility.  PASARR submitted to EDS on  PASARR number received from [employer] on   FL2 transmitted to all facilities in geographic area requested by pt/family on  01/14/2012 FL2 transmitted to all facilities within larger geographic area on 01/14/2012  Patient informed that his/her managed care company has contracts with or will negotiate with  certain facilities, including the following:     Patient/family informed of bed offers received:   Patient chooses bed at  Physician recommends and patient chooses bed at    Patient to be transferred to  on   Patient to be transferred to facility by   The following physician request were entered in Epic:   Additional Comments: Currently provider link is down and CSW is unable to submit for a 30day pasarr.

## 2012-01-14 NOTE — Clinical Social Work Psychosocial (Signed)
     Clinical Social Work Department BRIEF PSYCHOSOCIAL ASSESSMENT 01/14/2012  Patient:  Maria Gaines, Maria Gaines     Account Number:  [id number]     Admit date:  01/13/2012  Clinical Social Worker:  Lourdes Sledge  Date/Time:  01/14/2012 11:35 AM  Referred by:  Physician  Date Referred:  01/14/2012 Referred for  SNF Placement   Other Referral:   Interview type:  Patient Other interview type:   CSW also spoke with pt spouse [name], pt daughter Early Osmond and pt grand daughter Neysa Bonito.    PSYCHOSOCIAL DATA Living Status:  HUSBAND Admitted from facility:   Level of care:   Primary support name:  Ayda Tancredi Primary support relationship to patient:  SPOUSE Degree of support available:   Spouse is unable to provide 24hr care for pt who presents with dementia.    CURRENT CONCERNS Current Concerns  Post-Acute Placement   Other Concerns:    SOCIAL WORK ASSESSMENT / PLAN CSW worked with this pt and family when pt was previously admitted and dc'ed to a SNF. Pt currently lives with her husband [name] who is mostly home however is having a difficult time managing pt who can be non compliant with medications and oxygen. CSW spoke with pt, spouse and family who are all agreeable to placement near [facility] however are aware that pt insurance may not approve SNF placement as pt does not need PT. Spouse agrees to take pt home if she is not able to get placed. CSW previously informed family that pt would need Medicaid and CSW encouraged family to apply however family has not done so.CSW will fax pt out to facilities in [place], [place] and [place].   Assessment/plan status:  Psychosocial Support/Ongoing Assessment of Needs Other assessment/ plan:   Information/referral to community resources:   CSW provided her contact information to family.    PATIENTS/FAMILYS RESPONSE TO PLAN OF CARE: Pt presents with dementia however was alert laying in bed. Per family pt can be noncompliant with  medications and oxygen usage. Family would like placement for pt as they believe she will get the care she needs. Currently pt only has [employer] which will not cover LT placement. Family is aware that insurance may not cover placement and will agree to take pt home and apply for Medicaid separetely.

## 2012-01-15 LAB — GLUCOSE, CAPILLARY: Glucose-Capillary: 96 mg/dL (ref 70–99)

## 2012-02-03 ENCOUNTER — Ambulatory Visit: Payer: Medicare Other | Admitting: Family Medicine

## 2012-02-03 NOTE — Discharge Summary (Signed)
FMTS Attending Note Agree with Discharge Summary.  Patient admitted with shortness of breath attributable to COPD exacerbation.  Dyspnea further exacerbated by deconditioning. Paula Compton, MD

## 2012-02-03 NOTE — Discharge Summary (Signed)
Family Medicine Resident Discharge Summary  Patient ID: Maria Gaines [id number] 61 y.o. [birth year]  Admit date: 01/13/2012  Discharge date and time: 01/14/12 14:54 to [facility]  Admitting Physician: Barbaraann Barthel, MD   Discharge Physician: Barbaraann Barthel, MD  Admission Diagnoses: COPD (chronic obstructive pulmonary disease) [496] SOB  Discharge Diagnoses: COPD, SOB, Hypoxemia  Admission Condition: fair  Discharged Condition: good  Indication for Admission: Hypoxemia to 47%  Hospital Course: Pt admitted on 01/13/12 after O2 sats in clinic noted to be 47% and cyanotic appearing. Pt given Albuterol treatment x1 and placed on O2 w/ return of O2 sats. Pt remained on home O2 of 2L [place] w/ Sats above 90% and normal respiratory effort. Pt comfortable and stating she feels back to herself this morning 01/14/12. Respiratory effort normal w/ stable Sats and mild end expiratory wheezes on exam. Pt and husband desiring to go home as opposed to transfer to SNF. PT evaluated pt and agreed w/ sending pt home instead of SNF.  Of note pt arrived in clinic thinking her O2 was on when it actually was in place but not on. Clinic staff turned on O2 w/ improvement in sats.  Consults: none  Significant Diagnostic Studies: none  Treatments: respiratory therapy: oxygen and Albuterol.  Discharge Exam: BP 99/57  Pulse 91  Temp(Src) 97.9 F (36.6 C) (Oral)  Resp 19  Ht 5\' 2"  (1.575 m)  Wt 147 lb 14.9 oz (67.1 kg)  BMI 27.06 kg/m2  SpO2 90%   Gen: WNWD, no distress CV: RRR RES: Mild end expiratory wheezes bilat., normal effort, 2L [place] Ext: No edema, 2+ pulses  Disposition: home  Patient Instructions:  Current Discharge Medication List    CONTINUE these medications which have NOT CHANGED   Details  acetaminophen (TYLENOL) 500 MG tablet Take 1,000 mg by mouth every 6 (six) hours as needed. For pain    aspirin EC 81 MG tablet Take 81 mg by mouth daily.    atorvastatin (LIPITOR) 80 MG  tablet Take 80 mg by mouth daily.    benazepril (LOTENSIN) 5 MG tablet Take 1 tablet (5 mg total) by mouth daily. Qty: 30 tablet, Refills: 11    carvedilol (COREG) 6.25 MG tablet Take 6.25 mg by mouth 2 (two) times daily with a meal.     clopidogrel (PLAVIX) 75 MG tablet Take 75 mg by mouth daily.      diltiazem (CARTIA XT) 180 MG 24 hr capsule Take 1 capsule (180 mg total) by mouth daily. Qty: 90 capsule, Refills: 3    Fluticasone-Salmeterol (ADVAIR) 250-50 MCG/DOSE AEPB Inhale 1 puff into the lungs every 12 (twelve) hours.    gabapentin (NEURONTIN) 300 MG capsule Take 300 mg by mouth as needed. For leg pain    LORazepam (ATIVAN) 2 MG tablet Take 0.5 tablets (1 mg total) by mouth 3 (three) times daily. Qty: 90 tablet, Refills: 5   Comments: 1 mg tab twice a day and 2 mg before bed.    metFORMIN (GLUCOPHAGE) 1000 MG tablet Take 1,000 mg by mouth 2 (two) times daily with a meal.    omeprazole (PRILOSEC) 40 MG capsule Take 40 mg by mouth daily.    potassium chloride (K-DUR,KLOR-CON) 10 MEQ tablet Take 10 mEq by mouth daily.    QUEtiapine (SEROQUEL) 25 MG tablet Take 25 mg by mouth at bedtime.    rivastigmine (EXELON) 4.6 mg/24hr Place 1 patch (4.6 mg total) onto the skin daily. Qty: 30 patch, Refills: 1  Activity: activity as tolerated and ambulate in house Diet: regular diet Wound Care: none needed  Follow-up with Dr. [name] on 02/03/12 at 10:45  Pt to be discharged to SNF in [place] on 01/14/12  Follow-up Items: 1. COPD medications. Pt reports only using albuterol twice monthly and never using her prescribed controller medications. 2. Consider changing Carvedilol for cardioselective bblocker such as metoprolol 3. Consider DC Plavix  Signed: Shelly Flatten, MD Family Medicine Resident PGY-1 [phone] 02/03/2012 12:53 AM

## 2012-02-10 ENCOUNTER — Telehealth: Payer: Self-pay | Admitting: Clinical

## 2012-02-10 NOTE — Telephone Encounter (Signed)
Clinical Child psychotherapist (CSW) contacted pt daughter last week to inquire if pt was at a facility or home and in need of [facility] services. CSW did not receive a call back so CSW contacted pt spouse today who confirmed pt is still in a nursing facility however may be discharged tomorrow. Pt spouse reports he is unable to provide 24hr care that pt needs at home. Spouse reports he, pt daughter and pt will be meeting with the admissions staff tomorrow to determine the plan of care for pt. CSW informed pt spouse (as she has informed both times pt was admitted,) that it is vital that pt have a Medicaid application completed as that is the only insurance that will pay for LT care. Spouse unsure as to whether an application has been completed however will follow up with the facility tomorrow.  Pt currently not in need of home health orders as of now.   Theresia Bough, MSW, Theresia Majors [phone]

## 2012-02-18 ENCOUNTER — Telehealth: Payer: Self-pay | Admitting: Family Medicine

## 2012-02-18 NOTE — Telephone Encounter (Signed)
Returned call to patient.  Patient states she is "confused and doesn't know what's right or wrong."   Has concerns about her meds---was in hospital x 2 weeks and d/c'd home yesterday.  States she does not know if she is taking her medications correctly and doesn't remember if she took her meds today.  Patient has a pillbox, but has not used it since being d/c'd home yesterday.  States she has "dementia."  Lives with her husband, but "he's confused, too and not able to help."  Discussed with Ellyn Hack, RN at [facility].  Will route note to her for advice.  Gaylene Brooks, RN

## 2012-02-18 NOTE — Telephone Encounter (Signed)
Patient is calling for the 2nd time.  She is very upset, just got out of the hospital.  She is very confused because she doesn't know how to take her medication.  She continually says "I need help".

## 2012-02-18 NOTE — Telephone Encounter (Signed)
Pt called very anxious to speak with Dr. [name], says she is "having a nervous break down"

## 2012-02-19 NOTE — Telephone Encounter (Signed)
Patient is calling back this morning asking to speak with Dr. [name].  She does sound better today, but still feels that speaking to Dr. [name] will ease some of her anxiety.

## 2012-02-24 ENCOUNTER — Encounter: Payer: Self-pay | Admitting: Family Medicine

## 2012-02-24 ENCOUNTER — Ambulatory Visit (INDEPENDENT_AMBULATORY_CARE_PROVIDER_SITE_OTHER): Payer: Medicare Other | Admitting: Family Medicine

## 2012-02-24 VITALS — BP 107/66 | HR 87 | Wt 146.0 lb

## 2012-02-24 DIAGNOSIS — J449 Chronic obstructive pulmonary disease, unspecified: Secondary | ICD-10-CM

## 2012-02-24 DIAGNOSIS — J4489 Other specified chronic obstructive pulmonary disease: Secondary | ICD-10-CM

## 2012-02-24 DIAGNOSIS — F319 Bipolar disorder, unspecified: Secondary | ICD-10-CM

## 2012-02-24 DIAGNOSIS — F0390 Unspecified dementia without behavioral disturbance: Secondary | ICD-10-CM

## 2012-02-24 MED ORDER — ALBUTEROL SULFATE (2.5 MG/3ML) 0.083% IN NEBU
2.5000 mg | INHALATION_SOLUTION | Freq: Once | RESPIRATORY_TRACT | Status: AC
  Administered 2012-02-24: 2.5 mg via RESPIRATORY_TRACT

## 2012-02-24 NOTE — Telephone Encounter (Signed)
Ext [phone] - nurse from [facility] is waiting on an order for Home Health.  She wanted to try and get it today because they have an opening for tomorrow to someone out to get her started.

## 2012-02-25 ENCOUNTER — Other Ambulatory Visit: Payer: Self-pay | Admitting: Family Medicine

## 2012-02-25 ENCOUNTER — Telehealth: Payer: Self-pay | Admitting: *Deleted

## 2012-02-25 NOTE — Telephone Encounter (Signed)
Dr. [name], Can I go and do a home health referral on patient?

## 2012-02-25 NOTE — Telephone Encounter (Signed)
Spoke with Inetta Fermo with [facility] in [place] and gave verbal orders

## 2012-02-25 NOTE — Telephone Encounter (Signed)
Yes OK to do a verbal order for home health referral Likely she sill need med management and perhaps PT-- THANKS! [name]

## 2012-02-25 NOTE — Telephone Encounter (Signed)
Spoke with Rosalita Chessman with [facility] and

## 2012-02-26 NOTE — Assessment & Plan Note (Signed)
Seems stable right now. I'm not sure she still on the Seroquel. I hope she is. We'll see if we can get her to bring her meds with her at the next office visit or see home health can evaluate.

## 2012-02-26 NOTE — Progress Notes (Signed)
  Subjective:    Patient ID: Maria Gaines, female    DOB: 03/10/1951, 61 y.o.   MRN: [id number]  HPI   Followup recent stay in nursing home. She is here with her husband she is interviewed along first. She is feeling well. Has lost some weight because her appetite this does not seem to be what it used to be. She reports enjoying her time spent in the nursing home is not sure why she ended up coming home. She is fine with standing at home for a while but does not think her husband Maria Gaines can ultimately take care of her long-term.  She does not think they've changed any of her medicines. She continues to use her oxygen. Says at home she's not really doing much except watching TV and has had no shortness of breath. She does need a new nebulizer machine as hers quit yesterday. She has been having some wheezing since she's not been able to use her new machine.  #3. Dementia. She readily admits that she cannot remember much of anything. Does not seem distressed by this at this time.  Interview with both her and her husband as the additional details: She can back off the nursing home because they're in insurance would not pay for her stay any longer. She has evidently apply for Medicaid. Maria Gaines does not think any of her medicines have been changed either. She never did get the exelon patch filled because her insurance would not pay for it. Review of Systems  Denies fever, sweats, chills. Please see history of present illness above for pertinent review of systems.    Objective:   Physical Exam  Vital signs reviewed. GENERAL: Well developed, well nourished, no acute distress Her pulse ox is 95% using 2 L nasal cannula. NECK: No lymphadenopathy, no thyromegaly, no carotid bruits . Lungs decreased breath sounds with occasional expiratory wheeze bilaterally at the base. No extra work of breathing. CARDIOVASCULAR: Regular rate and rhythm without murmur gallop or rub. Pulses distally are 2+. No edema  distally in the periphery. NEURO: She is alert. Oriented to person and place. She asks and answers some questions very appropriately. She does not have recall of some details like her medicines or when she came on from the nursing home her exactly why. She seems pleasant and happy in her affect. She can follow simple commands.      Assessment & Plan:

## 2012-02-26 NOTE — Assessment & Plan Note (Signed)
Doing well with her continuous oxygen at home. I gave her a nebulizer treatment in the office today and then I gave her prescription for nebulizer machine at all her husband where to go to pick it up. The medicine changes.

## 2012-02-26 NOTE — Assessment & Plan Note (Signed)
She seems much more calm and logical today. She seems to be doing well at home. I'm not sure how long her husband can continue to care for her as I suspect she is needing a significant amount of care. I will were home health evaluation. I will see her back in one month or so. I suspect she still has Seroquel but am really not sure. Maybe once home health itself there have get a better idea of her medication regimen.

## 2012-03-01 NOTE — Telephone Encounter (Signed)
Spoke with suzanne and she is going to have their Child psychotherapist go out to talk to the patient. Patient's insurance has ran out and she is unable to go to nursing home until she reinstates her medicaid. Spoke with Dr. [name] about this also who ok'ed me to let Rosalita Chessman with [facility] know this

## 2012-03-01 NOTE — Telephone Encounter (Signed)
Pt spoke with [facility] nurse that she wanted to go back to nursing home and wanted to put in verbal order to go see her to discuss.

## 2012-03-09 ENCOUNTER — Telehealth: Payer: Self-pay | Admitting: Family Medicine

## 2012-03-09 NOTE — Telephone Encounter (Signed)
Spoke with Chip Boer about patient. She says that patient at first expressed an interest in ALF and now refuses it. She does not want to spend her own money for home health care and Chip Boer says that patient does not qualify through her insurance. She was wanting to know if there are any other programs she can use.

## 2012-03-09 NOTE — Telephone Encounter (Signed)
Needs to talk to nurse about what she can do for patient - not sure if there is anything they can do

## 2012-03-10 NOTE — Telephone Encounter (Signed)
Dear [name] Team I know of nothing specific. Has she heard back from her medicaid application yet? If she gets approved for medicaid , then she could return to [facility]! [name]

## 2012-03-10 NOTE — Telephone Encounter (Signed)
Called and informed Chip Boer that Dr. [name] did not have any suggestions and that if she was approved for MCD that she could return to the NH for her care. Chip Boer stated that the pt was ok with this in the beginning but has since changed her mind.Laureen Ochs, Viann Shove

## 2012-03-18 ENCOUNTER — Other Ambulatory Visit: Payer: Self-pay | Admitting: *Deleted

## 2012-03-18 ENCOUNTER — Telehealth: Payer: Self-pay | Admitting: Family Medicine

## 2012-03-18 MED ORDER — LORAZEPAM 2 MG PO TABS: 1.0000 mg | ORAL_TABLET | Freq: Three times a day (TID) | ORAL | Status: DC

## 2012-03-18 NOTE — Telephone Encounter (Signed)
Maria Gaines calling tearful requesting to speak with you.  Please contact her asap.  She says she feels like she's having a nervous breakdown.

## 2012-03-21 ENCOUNTER — Other Ambulatory Visit: Payer: Self-pay | Admitting: Family Medicine

## 2012-03-26 ENCOUNTER — Other Ambulatory Visit: Payer: Self-pay | Admitting: Family Medicine

## 2012-03-29 ENCOUNTER — Other Ambulatory Visit: Payer: Self-pay | Admitting: Family Medicine

## 2012-03-29 MED ORDER — LORAZEPAM 1 MG PO TABS: ORAL_TABLET | ORAL | Status: AC

## 2012-03-29 NOTE — Progress Notes (Signed)
[facility] [phone]

## 2012-04-04 ENCOUNTER — Other Ambulatory Visit: Payer: Self-pay | Admitting: Family Medicine

## 2012-04-06 ENCOUNTER — Telehealth: Payer: Self-pay | Admitting: Family Medicine

## 2012-04-06 NOTE — Telephone Encounter (Signed)
Patient is very confused.  She thinks that her husband isn't giving all of her meds and she wants to speak to someone about what meds she is supposed to be taking.

## 2012-04-06 NOTE — Telephone Encounter (Signed)
Busy signal again.

## 2012-04-06 NOTE — Telephone Encounter (Signed)
Spoke with patient first . She is very agitated  Over the phone . She asks her husband to come to the phone and he states she is fine . Ask him about the meds she is taking and there is some confusion it seems. He states he gives meds to her . Suggested they come tomorrow AM for me to review all meds and make sure she is taking correctly.

## 2012-04-06 NOTE — Telephone Encounter (Signed)
Tried to return call. Busy signal.

## 2012-04-07 ENCOUNTER — Ambulatory Visit (INDEPENDENT_AMBULATORY_CARE_PROVIDER_SITE_OTHER): Payer: Medicare Other | Admitting: *Deleted

## 2012-04-07 DIAGNOSIS — Z7189 Other specified counseling: Secondary | ICD-10-CM

## 2012-04-08 NOTE — Progress Notes (Signed)
Patient in office with husband to review meds..  Did not bring all meds . Did not bring in meds that she takes at night. . Patient and husband went home and husband called back to complete med review.  RN consulted with Dr. Leveda Anna on meds because there is a discrepancy on meds on hand and med list.    Meds not taking,  Seroquel, Diltiazem , Furosemide, Benazepril.  Advised per Dr. Leveda Anna not to take potassium  if not taking Lasix.   Also patient has been taking Theophylline . Will have patient continue this until Dr. [name] returns and can check with her about all meds.   Appointment scheduled for  08/22 with Dr. [name] . Advised to bring all meds to appointment.   Patient doesn't think husband gives meds correctly. Each  party  becomes agitated with the other during visit. Encouraged them to work together.

## 2012-04-12 ENCOUNTER — Other Ambulatory Visit: Payer: Self-pay | Admitting: Family Medicine

## 2012-04-14 ENCOUNTER — Other Ambulatory Visit: Payer: Self-pay | Admitting: Family Medicine

## 2012-04-14 ENCOUNTER — Ambulatory Visit: Payer: Medicare Other

## 2012-04-21 ENCOUNTER — Ambulatory Visit (INDEPENDENT_AMBULATORY_CARE_PROVIDER_SITE_OTHER): Payer: Medicare Other | Admitting: Family Medicine

## 2012-04-21 ENCOUNTER — Encounter: Payer: Self-pay | Admitting: Family Medicine

## 2012-04-21 VITALS — HR 82 | Temp 97.5°F | Wt 146.0 lb

## 2012-04-21 DIAGNOSIS — F0390 Unspecified dementia without behavioral disturbance: Secondary | ICD-10-CM

## 2012-04-21 DIAGNOSIS — Z9981 Dependence on supplemental oxygen: Secondary | ICD-10-CM

## 2012-04-21 DIAGNOSIS — F068 Other specified mental disorders due to known physiological condition: Secondary | ICD-10-CM

## 2012-04-21 DIAGNOSIS — J449 Chronic obstructive pulmonary disease, unspecified: Secondary | ICD-10-CM

## 2012-04-21 DIAGNOSIS — E119 Type 2 diabetes mellitus without complications: Secondary | ICD-10-CM

## 2012-04-21 DIAGNOSIS — J4489 Other specified chronic obstructive pulmonary disease: Secondary | ICD-10-CM

## 2012-04-21 DIAGNOSIS — I10 Essential (primary) hypertension: Secondary | ICD-10-CM

## 2012-04-21 LAB — COMPREHENSIVE METABOLIC PANEL
ALT: 14 U/L (ref 0–35)
AST: 16 U/L (ref 0–37)
Creat: 0.66 mg/dL (ref 0.50–1.10)
Sodium: 139 mEq/L (ref 135–145)
Total Bilirubin: 0.3 mg/dL (ref 0.3–1.2)
Total Protein: 7 g/dL (ref 6.0–8.3)

## 2012-04-21 LAB — CBC WITH DIFFERENTIAL/PLATELET
Basophils Relative: 1 % (ref 0–1)
Eosinophils Absolute: 0.2 10*3/uL (ref 0.0–0.7)
Eosinophils Relative: 2 % (ref 0–5)
HCT: 33.7 % — ABNORMAL LOW (ref 36.0–46.0)
Hemoglobin: 11.3 g/dL — ABNORMAL LOW (ref 12.0–15.0)
MCH: 27.8 pg (ref 26.0–34.0)
MCHC: 33.5 g/dL (ref 30.0–36.0)
MCV: 82.8 fL (ref 78.0–100.0)
Monocytes Absolute: 0.6 10*3/uL (ref 0.1–1.0)
Monocytes Relative: 8 % (ref 3–12)
RDW: 15.3 % (ref 11.5–15.5)

## 2012-04-21 LAB — POCT GLYCOSYLATED HEMOGLOBIN (HGB A1C): Hemoglobin A1C: 6.5

## 2012-04-21 MED ORDER — TIOTROPIUM BROMIDE MONOHYDRATE 18 MCG IN CAPS: 18.0000 ug | ORAL_CAPSULE | Freq: Every day | RESPIRATORY_TRACT | Status: AC

## 2012-04-21 MED ORDER — THEOPHYLLINE ER 300 MG PO TB12: 300.0000 mg | ORAL_TABLET | Freq: Two times a day (BID) | ORAL | Status: AC

## 2012-04-21 NOTE — Assessment & Plan Note (Signed)
Seems to have stabilized. She and her husband are getting along extremely well. Energy them both separately and together. That they're working things out. I will see her back in 2 months and I have given her husband the appointment card.

## 2012-04-21 NOTE — Assessment & Plan Note (Signed)
Weight has stabilized. No episodes of hypoglycemia. No problems with medicines. We'll check A1c today and make no medication changes.

## 2012-04-21 NOTE — Assessment & Plan Note (Signed)
Blood pressure is certainly lower than previously and she's evidently only on the carvedilol. At this time I will not make any changes. Notably we are discontinuing her ACE inhibitor at this time. Will followup in 2 months. Basic metabolic profile today her creatinine, electrolytes.

## 2012-04-21 NOTE — Progress Notes (Signed)
  Subjective:    Patient ID: Maria Gaines, female    DOB: 1951/04/01, 61 y.o.   MRN: [id number]  HPI  #1. Followup dementia. She is here with her husband today reviewed her alone. He says things are going quite well at home. She and her husband are getting along much better. He is taking care of all her medications except her Ativan. She is taking that twice a day when she needs it. She self administers that. She is wearing her oxygen at home full-time. She has had 2 falls to [place] which was significant. Postop she tripped over her oxygen tube.  #2. Chronic medical issues: Hypertension. Blood pressure is low and she is only taking her carvedilol. I will not make any changes in that. I will check CMP today #3. Hyperlipidemia. She continues on atorvastatin. We'll check direct LDL today as well as liver function panel. #4. Diabetes mellitus. Her weight loss has stabilized. She looks great today. We'll check A1c.  Review of Systems Denies fever, sweats, chills.    Objective:   Physical Exam  Vital signs reviewed GENERALl: Well developed, well nourished, in no acute distress. NECK: Supple, FROM, without lymphadenopathy.  THYROID: normal without nodularity CAROTID ARTERIES: without bruits LUNGS: clear to auscultation bilaterally. No wheezes or rales. HEART: Regular rate and rhythm, no murmurs ABDOMEN: soft with positive bowel sounds MSK: MOE x 4 SKIN no rash NEURO: no gross  focal deficits. PSYCH: Alert and oriented x4. She gets two out of three objects on recall exam. She asks and answers questions appropriately. She does have some mild difficulty answering complex questions. Her speech is normal in content fluency. She denies hallucination both auditory and visual.       Assessment & Plan:

## 2012-04-22 ENCOUNTER — Telehealth: Payer: Self-pay | Admitting: Cardiology

## 2012-04-22 NOTE — Telephone Encounter (Signed)
New Problem:     I called the patient and was unable to reach them. I left a message on their voicemail with my name, the reason I called, the name of his physician, and a number to call back to schedule their appointment. 

## 2012-04-26 ENCOUNTER — Other Ambulatory Visit: Payer: Self-pay | Admitting: Family Medicine

## 2012-04-26 NOTE — Telephone Encounter (Signed)
Patient is calling for a [facility]ill [facility] [address].  She is completely out and her pharmacy said she had to call her MD for a new Rx.

## 2012-04-27 MED ORDER — ATORVASTATIN CALCIUM 80 MG PO TABS: 80.0000 mg | ORAL_TABLET | Freq: Every day | ORAL | Status: AC

## 2012-04-28 ENCOUNTER — Telehealth: Payer: Self-pay | Admitting: *Deleted

## 2012-04-28 NOTE — Telephone Encounter (Signed)
Patient declined services:  Yes  No   Primary Presenting Issue:  Received referral from [facility].  Manager states she has received several calls from patient stating she does not understand her medications, she was confused and did not know what to do.  patient has history of dementia, falls, hypertension.   Interventions:  Home visit to assess needs, educate patient on medications, coordinate home care services.  Patient had been discharged from [facility] [place] with an order for [facility] through [place].  Contact was made with [facility] who states she order for [facility] had not been received in the [place] office from the [place] office.  This RNCM coordinated the transfer of information, closing the gap in care.   Has issue been resolved:  Yes  No   Secondary Presenting Issue:  patient has need for chronic disease and medication education.  [facility] to provide services.   Interventions: deferred chronic disease education to [place].  This RNCM will continue to follow telephonically.   Has issue been resolved:  Yes  No   Additional Comments: patient's husband is to be admitted to hospital later this year for surgery.  Both patient and husband agree to her being placed because of her dementia, lack of continuous caregiver and patient safety.  This [facility] RNCM will continue to follow to assess for needs and interventions.       Emilia Beck, RN-BC, M.Ed. [facility] Coordinator [phone] [email or web address]

## 2012-04-30 ENCOUNTER — Encounter: Payer: Self-pay | Admitting: Family Medicine

## 2012-05-03 ENCOUNTER — Other Ambulatory Visit: Payer: Self-pay | Admitting: Family Medicine

## 2012-05-03 MED ORDER — LANCETS MISC: Status: DC

## 2012-05-03 MED ORDER — GLUCOSE BLOOD VI STRP: ORAL_STRIP | Status: DC

## 2012-05-04 ENCOUNTER — Telehealth: Payer: Self-pay | Admitting: *Deleted

## 2012-05-04 NOTE — Telephone Encounter (Signed)
Called patient to inquire about what glucometer she uses and how many times a day. [place] meter 3 times a day

## 2012-05-11 ENCOUNTER — Other Ambulatory Visit: Payer: Self-pay | Admitting: Family Medicine

## 2012-05-11 MED ORDER — GLUCOSE BLOOD VI STRP
ORAL_STRIP | Status: DC
Start: 2012-05-11 — End: 2012-05-24

## 2012-05-11 MED ORDER — ONETOUCH ULTRASOFT LANCETS MISC: Status: DC

## 2012-05-13 ENCOUNTER — Other Ambulatory Visit: Payer: Self-pay | Admitting: Family Medicine

## 2012-05-23 ENCOUNTER — Telehealth: Payer: Self-pay | Admitting: Family Medicine

## 2012-05-23 NOTE — Telephone Encounter (Signed)
Please call Callia asap.  It's an emergency

## 2012-05-24 ENCOUNTER — Telehealth: Payer: Self-pay | Admitting: *Deleted

## 2012-05-24 ENCOUNTER — Other Ambulatory Visit: Payer: Self-pay | Admitting: *Deleted

## 2012-05-24 DIAGNOSIS — E119 Type 2 diabetes mellitus without complications: Secondary | ICD-10-CM

## 2012-05-24 MED ORDER — ONETOUCH ULTRA SYSTEM W/DEVICE KIT: 1.0000 | PACK | Freq: Once | Status: DC

## 2012-05-24 MED ORDER — GLUCOSE BLOOD VI STRP: ORAL_STRIP | Status: DC

## 2012-05-24 MED ORDER — ONETOUCH ULTRASOFT LANCETS MISC: Status: DC

## 2012-05-24 NOTE — Telephone Encounter (Signed)
Dear [name] Team hopefuly this was NOT an emergecy. Can u please call and see what she needs? THANKS! [name]

## 2012-05-25 ENCOUNTER — Encounter: Payer: Self-pay | Admitting: Family Medicine

## 2012-05-25 ENCOUNTER — Ambulatory Visit (INDEPENDENT_AMBULATORY_CARE_PROVIDER_SITE_OTHER): Payer: Medicare Other | Admitting: Family Medicine

## 2012-05-25 VITALS — BP 106/56 | HR 90 | Temp 98.3°F | Wt 146.0 lb

## 2012-05-25 DIAGNOSIS — F0390 Unspecified dementia without behavioral disturbance: Secondary | ICD-10-CM

## 2012-05-25 DIAGNOSIS — E119 Type 2 diabetes mellitus without complications: Secondary | ICD-10-CM

## 2012-05-25 DIAGNOSIS — F068 Other specified mental disorders due to known physiological condition: Secondary | ICD-10-CM

## 2012-05-25 DIAGNOSIS — J449 Chronic obstructive pulmonary disease, unspecified: Secondary | ICD-10-CM

## 2012-05-25 MED ORDER — ONETOUCH ULTRASOFT LANCETS MISC: Status: DC

## 2012-05-25 MED ORDER — GLUCOSE BLOOD VI STRP: ORAL_STRIP | Status: DC

## 2012-05-25 MED ORDER — ONETOUCH ULTRA SYSTEM W/DEVICE KIT: PACK | Status: DC

## 2012-05-25 NOTE — Patient Instructions (Addendum)
I understand you are interested in going back to the nursing home. The social worker told me you need to complete a MEDICAID application to see if you qualify as this is likely the best way to get reimbursement for Nursing Home. You  Can apply for MEDICAID through the department of social service.

## 2012-05-25 NOTE — Assessment & Plan Note (Signed)
Last A1c was 6.5. She seems to be doing well her weight has stabilized. No medication changes. We'll continue her on the aspirin. Her last creatinine check was about 2 months ago saw plan to check that when I see her back.

## 2012-05-25 NOTE — Telephone Encounter (Signed)
Late Entry  Yesterday patient called and call was forwarded to me. Patient was upset because she doesn't know what medicines she is suppose to be taking.  I  ask to speak with [name], her husband ,and he advises that [facility] and she didn't know why and this has gotten her upset.  He gives patient medications regularly he states. She is needing carvedilol and Dr. Hazle Coca office told her she needs to come in for appointment but patient doesn't want to go back there. Advised him to contact Dr. Hazle Coca office to ask what she needs to do .  Husband also says patient  never did receive glucose monitor.  I resent Rx for One Touch Ultra, and strips and lancets. Called pharmacy to make sure everything is in order and was told it is. Patient notified that she can pick up meter.  Patient has appointment 09/25.

## 2012-05-25 NOTE — Assessment & Plan Note (Signed)
Continue home oxygen. Continue current medication regimen without change. Followup 3 months. We have continued her on the theophylline as it was a long-standing medicine for her although I still think it would be reasonable to do a second trial of discontinuation of that. This time she but to leave everything the same.

## 2012-05-25 NOTE — Assessment & Plan Note (Signed)
She seems to have stabilized in her symptoms although comparing her now compared with one year ago, there is significant decline. Her insurance did not cover the Exelon patch. When I try to discuss options for her dementia, she really doesn't want to do anything significantly different from what she's doing now. Am not entirely certain she understands fully this time how profound her dementia is. She does reiterate again that she wants to go back to the nursing home. I looked back through the chart and noted that the social worker and told her that she needs to apply for Medicaid. I gave her handout on Medicaid applications today, also put some instructions in her patient instructions so she can show them to her husband when he picks her up. We'll be happy to help her in any way we can. I suspect the issue is financial.

## 2012-05-25 NOTE — Telephone Encounter (Signed)
Patient was seen today.Maria Gaines

## 2012-05-25 NOTE — Progress Notes (Signed)
  Subjective:    Patient ID: Maria Gaines, female    DOB: October 31, 1950, 61 y.o.   MRN: [id number]  HPI  #1.  F/u DM: no episodes of low blood sugar. No idea what her sugars are running. Appetite OK. No nausea or vomiting. #2.  Followup COPD. She is wearing her oxygen regularly without any problem. She's had no excessive cough. No shortness of breath other than her baseline. #3. Social issues. She and her husband have been getting along much better until recently. She says he's getting frustrated with her again. She says she feels like she understands this because she has a lot of work to take care of 24 hours 7 days a week. She would like return to the nursing home but is unclear whether or not they got Medicaid form done. #4. Dementia: Her husband brought her to the appointment but he has not here with her in the clinic exam room. She says one of her care workers told her husband not to leave her alone so he's been pretty much with her 24/ 7   Review of Systems See hpi    Objective:   Physical Exam Vital signs reviewed. GENERAL: Well developed, well nourished, no acute distress. Wearing her oxygen LUNGS: Decreased breath sounds in all lung fields but no rales, no wheeze. Worker respiratory breathing. CARDIOVASCULAR: Regular rate and rhythm no murmur PSYCHIATRIC: Alert and oriented to person place and day. Difficulty with short-term memory as evidence by her repeating questions within 3 or 4 minutes of asking them in another getting answered. Pleasant. Does not appear to be in any distress. Her speech fluency is normal. She has occasional word finding difficulties. MUSCLE skeletal: She can get out of a chair without assistance, normal gait although she walks somewhat slowly.        Assessment & Plan:

## 2012-05-30 ENCOUNTER — Telehealth: Payer: Self-pay | Admitting: Family Medicine

## 2012-05-30 NOTE — Telephone Encounter (Signed)
FL-2 form dropped off to be filled out.  Please call when completed.

## 2012-06-01 ENCOUNTER — Telehealth: Payer: Self-pay | Admitting: *Deleted

## 2012-06-01 ENCOUNTER — Telehealth: Payer: Self-pay | Admitting: Family Medicine

## 2012-06-01 NOTE — Telephone Encounter (Signed)
Called pt to inform her that Dr. [name] has her FL2 forms. And told her that she should call her insurance company and find out which providers for GYN are covered with the insurance that she has. Pt voiced understanding.Maria Gaines [name]

## 2012-06-01 NOTE — Telephone Encounter (Signed)
Maria Gaines OK to order home health assessment BUT---it seems we had [facility] going out there so maybe check withthem and see STATUS. OK verbal order for all of the above---I just do not want to duplicate services. THANKS! [name]

## 2012-06-01 NOTE — Telephone Encounter (Signed)
I have left message for [facility] nurse to call me back to see if they are still going out and assisting [place].  I think patient has to have medicaid in order to qualify for home health.  Will verify that once the nurse calls me back.  Ileana Ladd

## 2012-06-01 NOTE — Telephone Encounter (Signed)
Maria Cohn, NP with [facility] calling to inform Maria Gaines that she went out yesterday to do a home visit.  Patient has known dementia and is having falls at home.  Wanted to make Dr Jennette Gaines aware of patients falls.  States she is falling out of bed and is afraid to take a shower without assistance.  Husband helps a little but seems overwhelmed by her needs.  Thinks she could possible use a referral for a Home Health to help with dressing, bathing and maybe a fall assessment.  Maria Gaines can be reached at [phone].  Will forward to Dr Jennette Gaines for review.  Maria Gaines

## 2012-06-01 NOTE — Telephone Encounter (Signed)
Patient is calling requesting a suggestion for an ob/gyn.  Also, patient is checking the status of the FL2 Form that was dropped off the other day.

## 2012-06-02 NOTE — Telephone Encounter (Signed)
Daughter called [facility] [place], [phone] Needs to talk to [facility] about the FL2 form - pls call

## 2012-06-03 NOTE — Telephone Encounter (Signed)
Called and LMOVM for daughter informing her that I spoke with Dr. [name] concerning the [facility] forms and Dr. [name] stated that she had spoken with her. I reinstated that Dr. [name] would complete the forms later this afternoon and they will be sent to the appropriate dept.Laureen Ochs, Viann Shove

## 2012-06-03 NOTE — Telephone Encounter (Signed)
Has been done, placed up front and family notified [name]

## 2012-06-06 ENCOUNTER — Telehealth: Payer: Self-pay | Admitting: Family Medicine

## 2012-06-06 NOTE — Telephone Encounter (Signed)
Pt is very tearful and upset that her husband still smokes in her house and and it is killing her - states that he doesn't care about her and refuses to smoke outside the house.  Has no where to go and she can't drive - wants to speak to nurse

## 2012-06-06 NOTE — Telephone Encounter (Signed)
Returned call to patient.  "Husband smokes all day long and starts cussing when I ask him to smoke outside." Patient has "asthma and bronchitis."  Informed patient only other option is to speak with her husband and educate him on risks of secondary smoke exposure in patients with asthma/bronchitis.  Patient thanked me for calling back and stated "there's nothing to be done and it's only going to make him fuss more."  Senaida Ores, Maryjean Ka, RN

## 2012-06-22 ENCOUNTER — Ambulatory Visit (INDEPENDENT_AMBULATORY_CARE_PROVIDER_SITE_OTHER): Payer: Medicare Other | Admitting: Family Medicine

## 2012-06-22 VITALS — BP 135/72 | HR 81 | Temp 98.2°F | Wt 146.3 lb

## 2012-06-22 DIAGNOSIS — F068 Other specified mental disorders due to known physiological condition: Secondary | ICD-10-CM

## 2012-06-22 DIAGNOSIS — I2510 Atherosclerotic heart disease of native coronary artery without angina pectoris: Secondary | ICD-10-CM

## 2012-06-22 DIAGNOSIS — E119 Type 2 diabetes mellitus without complications: Secondary | ICD-10-CM

## 2012-06-22 DIAGNOSIS — F0390 Unspecified dementia without behavioral disturbance: Secondary | ICD-10-CM

## 2012-06-22 NOTE — Patient Instructions (Addendum)
YOu seem to be doing well. Your weight is stable, your oxygen level is good. Make sure you keep your oxygen tank handy!  Ask your daughter about your Medicaid--it was my understanding that once you received medicaid you would be eligible for return to the nursing home. Ask your daughter about this. She can call me if she needs to See you in 2 months!

## 2012-06-23 NOTE — Assessment & Plan Note (Signed)
She seems to have stabilized in her decline. She is still quite impaired. We again discussed options such as Aricept or Exelon she's not really excited about trying either one because she's afraid of the side effects and there does not seem to be much efficacy in her opinion area I agree that she probably needs to be placed in nursing home as I don't think any single person could adequately supervising take care of her.

## 2012-06-23 NOTE — Assessment & Plan Note (Signed)
Her weight has stabilized. She seems to be doing well. I will check A1c when I see her back in a month. I would also check creatinine and see if we need to continue the metformin or not the

## 2012-06-23 NOTE — Progress Notes (Signed)
  Subjective:    Patient ID: Maria Gaines, female    DOB: 02-28-1951, 61 y.o.   MRN: [id number]  HPI  Here with her husband for followup of chronic medical issues. She was interviewed alignment then again with her husband present. #1. COPD requiring supplemental on shin. She reports that at times she goes without her oxygen and feels fine. Since she knows which needs to wear it. She is without her oxygen in clinic now, reporting that she has it in the car. Her pulse ox is 96% on room air and she is breathing comfortably #2. Dementia rapidly progressive: She and her husband both agreed that he is no longer able to care for her full-time. She has episodes where she gets quite confused and sometimes angry. There has been no physical violence but there has been a lot of yelling between the 2 of them at times. Other times it seemed to do quite well until one of his company. Her husband is trying to manage her medications but realizes that she may be taking some without his knowledge because she is occasionally run out of her Lipitor Melvern Banker. She denies that she's taking anything that is not as prescribed. Negative for mild yelling match in the office.  Her Medicaid is evidently been approved and they're in the process of finding her nursing home. [name] reports that they must find one done November 24 are available have to go back through the whole process again. The goal is to find one year [place] where her daughter lives. We discussed that it might be necessary to find one here and then transferred to [facility] when a bed becomes available. #3. Diabetes mellitus:  She has no idea what her blood sugar our reading area she reports and [name] agrees that she's had no episodes of low blood sugar. #4. Hypertension. She's not having a problems with her medications. She reports taking them regularly. She denies chest pain. She's had no lower extremity edema and no palpitations #5. Recently had bilateral cataract  surgery and says she is seeing much more clearly. Review of Systems See history of present illness    Objective:   Physical Exam  Vital signs are reviewed GENERAL: Well-developed well appointed, no acute distress. She appears happy and in a good mood. HEENT. Neck is without lymphadenopathy no thyromegaly no carotid bruits. Full range of motion. CARDIOVASCULAR: Regular rate and rhythm no murmur gallop or rub LUNGS: Clear to auscultation bilaterally without rales or wheeze. No extra work of breathing. Pulse ox 96% on room air without supplemental oxygen. NEURO: alert and o. She has a lot of problems with losing her train of thought midsentence and this is somewhat frustrating to her. No focal deficit in motor function of upper or lower extremity. She walks without assistance and rises easily from a chair. PSYCHIATRIC: Mood is happy. She is very cooperative. Deficits in recent and remote memory. Speech is fluent and content is normalriented x4      Assessment & Plan:

## 2012-06-23 NOTE — Assessment & Plan Note (Signed)
Seems stable currently from a cardiovascular standpoint. I would continue Plavix. Continue Lipitor and her hypertension medicines.

## 2012-07-04 ENCOUNTER — Telehealth: Payer: Self-pay | Admitting: Family Medicine

## 2012-07-04 NOTE — Telephone Encounter (Signed)
Was at [facility] and they aren't sure why she can't go to these places - is trying to get something going and needs some explaining

## 2012-07-05 NOTE — Telephone Encounter (Signed)
Called and lvm for Maria Gaines to call back. Calling to get further information on what it is that is needed from our facility.Heath Gold'

## 2012-07-06 NOTE — Telephone Encounter (Signed)
Maria Gaines called today very upset and confused and is wanting Dr [name] to help her get into a nursing home - I told her that we were in touch with her son and that we would do whatever we could to help her.  Wants to talk to nurse Dr [name]

## 2012-07-07 ENCOUNTER — Encounter: Payer: Self-pay | Admitting: Family Medicine

## 2012-07-07 NOTE — Telephone Encounter (Signed)
Was told that his mother does not need a skilled nursing facility she will need an assistant living facility

## 2012-07-07 NOTE — Telephone Encounter (Signed)
Called [facility] to get fax number so that FL2 forms can be sent [phone].  Forms have been faxed. And FL2 placed in to be scanned box.Loralee Pacas [place]

## 2012-07-07 NOTE — Progress Notes (Signed)
Patient ID: Maria Gaines, female   DOB: 1951/07/12, 61 y.o.   MRN: [id number] Asked to fill out FL-2 for assisted living. Have done that and am adding a med list (AVS type) and her problem list (as above)  And list health maintenance listfor their convenience.  Patient Active Problem List  Diagnosis  . BREAST CANCER  . DIABETES MELLITUS II, UNCOMPLICATED  . HYPERCHOLESTEROLEMIA  . BIPOLAR DISORDER  . PANIC ATTACKS  . HYPERTENSION, BENIGN SYSTEMIC  . CORONARY, ARTERIOSCLEROSIS  . CHF - EJECTION FRACTION < 50%  . ALLERGIC RHINITIS  . COPD  . GASTROESOPHAGEAL REFLUX, NO ESOPHAGITIS  . OXYGEN-USE OF SUPPLEMENTAL  . Presence of stent in Proximal Right Coronary Artery   . Rapidly progressive dementia

## 2012-07-11 ENCOUNTER — Emergency Department (HOSPITAL_COMMUNITY): Payer: Medicare Other

## 2012-07-11 ENCOUNTER — Encounter (HOSPITAL_COMMUNITY): Payer: Self-pay | Admitting: Emergency Medicine

## 2012-07-11 ENCOUNTER — Other Ambulatory Visit: Payer: Self-pay

## 2012-07-11 ENCOUNTER — Telehealth: Payer: Self-pay | Admitting: Family Medicine

## 2012-07-11 ENCOUNTER — Emergency Department (HOSPITAL_COMMUNITY)
Admission: EM | Admit: 2012-07-11 | Discharge: 2012-07-11 | Disposition: A | Discharge status: Home / Self Care | Payer: Medicare Other | Attending: Emergency Medicine | Admitting: Emergency Medicine

## 2012-07-11 DIAGNOSIS — Y92009 Unspecified place in unspecified non-institutional (private) residence as the place of occurrence of the external cause: Secondary | ICD-10-CM | POA: Insufficient documentation

## 2012-07-11 DIAGNOSIS — Z853 Personal history of malignant neoplasm of breast: Secondary | ICD-10-CM | POA: Insufficient documentation

## 2012-07-11 DIAGNOSIS — W19XXXA Unspecified fall, initial encounter: Secondary | ICD-10-CM

## 2012-07-11 DIAGNOSIS — Z87891 Personal history of nicotine dependence: Secondary | ICD-10-CM | POA: Insufficient documentation

## 2012-07-11 DIAGNOSIS — J45909 Unspecified asthma, uncomplicated: Secondary | ICD-10-CM | POA: Insufficient documentation

## 2012-07-11 DIAGNOSIS — Z8719 Personal history of other diseases of the digestive system: Secondary | ICD-10-CM | POA: Insufficient documentation

## 2012-07-11 DIAGNOSIS — S0990XA Unspecified injury of head, initial encounter: Secondary | ICD-10-CM | POA: Insufficient documentation

## 2012-07-11 DIAGNOSIS — Z79899 Other long term (current) drug therapy: Secondary | ICD-10-CM | POA: Insufficient documentation

## 2012-07-11 DIAGNOSIS — Z87828 Personal history of other (healed) physical injury and trauma: Secondary | ICD-10-CM | POA: Insufficient documentation

## 2012-07-11 DIAGNOSIS — W010XXA Fall on same level from slipping, tripping and stumbling without subsequent striking against object, initial encounter: Secondary | ICD-10-CM | POA: Insufficient documentation

## 2012-07-11 DIAGNOSIS — Z7982 Long term (current) use of aspirin: Secondary | ICD-10-CM | POA: Insufficient documentation

## 2012-07-11 DIAGNOSIS — E119 Type 2 diabetes mellitus without complications: Secondary | ICD-10-CM | POA: Insufficient documentation

## 2012-07-11 DIAGNOSIS — Z9181 History of falling: Secondary | ICD-10-CM | POA: Insufficient documentation

## 2012-07-11 DIAGNOSIS — Z8701 Personal history of pneumonia (recurrent): Secondary | ICD-10-CM | POA: Insufficient documentation

## 2012-07-11 DIAGNOSIS — J4489 Other specified chronic obstructive pulmonary disease: Secondary | ICD-10-CM | POA: Insufficient documentation

## 2012-07-11 DIAGNOSIS — I2510 Atherosclerotic heart disease of native coronary artery without angina pectoris: Secondary | ICD-10-CM | POA: Insufficient documentation

## 2012-07-11 DIAGNOSIS — I10 Essential (primary) hypertension: Secondary | ICD-10-CM | POA: Insufficient documentation

## 2012-07-11 DIAGNOSIS — J449 Chronic obstructive pulmonary disease, unspecified: Secondary | ICD-10-CM | POA: Insufficient documentation

## 2012-07-11 DIAGNOSIS — F068 Other specified mental disorders due to known physiological condition: Secondary | ICD-10-CM | POA: Insufficient documentation

## 2012-07-11 DIAGNOSIS — Y93E3 Activity, vacuuming: Secondary | ICD-10-CM | POA: Insufficient documentation

## 2012-07-11 DIAGNOSIS — Z9861 Coronary angioplasty status: Secondary | ICD-10-CM | POA: Insufficient documentation

## 2012-07-11 LAB — BASIC METABOLIC PANEL
BUN: 8 mg/dL (ref 6–23)
CO2: 29 mEq/L (ref 19–32)
Chloride: 99 mEq/L (ref 96–112)
Creatinine, Ser: 0.62 mg/dL (ref 0.50–1.10)
GFR calc Af Amer: >90 mL/min (ref >90)
Glucose, Bld: 170 mg/dL — ABNORMAL HIGH (ref 70–99)
Potassium: 3.8 mEq/L (ref 3.5–5.1)

## 2012-07-11 LAB — CBC WITH DIFFERENTIAL/PLATELET
Basophils Relative: 0 % (ref 0–1)
HCT: 35.2 % — ABNORMAL LOW (ref 36.0–46.0)
Hemoglobin: 11.6 g/dL — ABNORMAL LOW (ref 12.0–15.0)
Lymphs Abs: 2.4 10*3/uL (ref 0.7–4.0)
MCHC: 33 g/dL (ref 30.0–36.0)
Monocytes Absolute: 0.5 10*3/uL (ref 0.1–1.0)
Monocytes Relative: 6 % (ref 3–12)
Neutro Abs: 4.2 10*3/uL (ref 1.7–7.7)
Neutrophils Relative %: 57 % (ref 43–77)
RBC: 4.06 MIL/uL (ref 3.87–5.11)

## 2012-07-11 LAB — GLUCOSE, CAPILLARY

## 2012-07-11 NOTE — ED Notes (Addendum)
Pt states she fell straight backwards and hit her head today at home. She also fell yesterday and Saturday straight back. Pt states she has dementia so she does not remember what she was doing when she fell but she does not remember feeling light headed, dizzy, or tripping. Denies blurry vision, headache, lightheadedness at this time.

## 2012-07-11 NOTE — ED Notes (Signed)
Pt in hallway yelling for a nurse. Stated "I am ready to go home, I have been here all day" in a loud pressured voice. Pt escorted back into room by RN and EMT. Pt went into bathroom and refused to give UA.

## 2012-07-11 NOTE — Telephone Encounter (Signed)
Patient states she fell this AM flat on her back and hit her head solidly on floor. States it still hurst . Dr. [name] advised for patient to go to ED. Patient is agreeable.

## 2012-07-11 NOTE — ED Notes (Addendum)
Pt c/o multiple falls for unknown reason; pt denies dizziness or lightheadedness; pt denies tripping; pt denies LOC, CP or SOB; pt alert at present with old bruise to left arm; pt with hx in chart of dementia

## 2012-07-11 NOTE — ED Notes (Signed)
RN ambulated patient to BR and pt stated "I don't know why I just feel dizzy all of the time". Pt denies dizziness at this time.

## 2012-07-11 NOTE — ED Notes (Signed)
Pt transported to radiology.

## 2012-07-11 NOTE — ED Notes (Signed)
MD at bedside. 

## 2012-07-11 NOTE — ED Provider Notes (Signed)
History     CSN: [id number]  Arrival date & time 07/11/12  1137   First MD Initiated Contact with Patient 07/11/12 1226      Chief Complaint  Patient presents with  . Fall    (Consider location/radiation/quality/duration/timing/severity/associated sxs/prior treatment) The history is provided by the patient and the spouse.  61 year old female followed by Family Medicine, brought in by spouse for multiple falls of unknown reason, but patient states she tripped today running the vacuam cleaner. Nursing note states did not trip but patient and spouse think she now did. Hx of dementia, but alert and active. Patient states she hit the back of her head when she fell backwards. No specific complaint of pain or injury this time. Old bruises present from previous falls.   Past Medical History  Diagnosis Date  . Visual field defect 03/09/11    Visual field defect right eye. Dr Cammy Copa optomotrist. plans rechck 6 m  . Hypertension   . Diabetes mellitus   . COPD (chronic obstructive pulmonary disease)   . Breast cancer 2003  . Vertigo   . Shortness of breath   . Asthma     per patient  . GERD (gastroesophageal reflux disease)   . Pneumonia     hx of pna  . DEMENTIA   . Coronary artery disease     Past Surgical History  Procedure Date  . Tubal ligation   . Coronary angioplasty with stent placement 11/30/2003     [name], M.D, drug eluting stent     History reviewed. No pertinent family history.  History  Substance Use Topics  . Smoking status: Former Smoker -- 1.5 packs/day for 50 years    Types: Cigarettes    Quit date: 08/31/2000  . Smokeless tobacco: Never Used  . Alcohol Use: No    OB History    Grav Para Term Preterm Abortions TAB SAB Ect Mult Living                  Review of Systems  Constitutional: Negative for fever.  HENT: Negative for neck pain.   Eyes: Negative for visual disturbance.  Respiratory: Negative for shortness of breath.   Cardiovascular:  Negative for chest pain.  Gastrointestinal: Negative for nausea, vomiting and abdominal pain.  Genitourinary: Negative for dysuria.  Musculoskeletal: Negative for back pain.  Skin: Negative for rash.  Neurological: Negative for dizziness, syncope, weakness, light-headedness and headaches.  Hematological: Does not bruise/bleed easily.    Allergies  Sertraline; Codeine; and Zolpidem tartrate  Home Medications   Current Outpatient Rx  Name  Route  Sig  Dispense  Refill  . ACETAMINOPHEN 500 MG PO TABS   Oral   Take 1,000 mg by mouth every 6 (six) hours as needed. For pain         . ASPIRIN EC 81 MG PO TBEC   Oral   Take 81 mg by mouth daily.         . ATORVASTATIN CALCIUM 80 MG PO TABS   Oral   Take 1 tablet (80 mg total) by mouth daily.   30 tablet   12   . CARVEDILOL 6.25 MG PO TABS   Oral   Take 6.25 mg by mouth 2 (two) times daily with a meal.          . CLOPIDOGREL BISULFATE 75 MG PO TABS   Oral   Take 75 mg by mouth daily.           Marland Kitchen  FLUTICASONE-SALMETEROL 250-50 MCG/DOSE IN AEPB   Inhalation   Inhale 1 puff into the lungs every 12 (twelve) hours.         Marland Kitchen GABAPENTIN 300 MG PO CAPS      TAKE 1 CAPSULE BY MOUTH AT BEDTIME   30 capsule   2   . LORAZEPAM 1 MG PO TABS      Take one tab three times a day   90 tablet   5   . METFORMIN HCL 1000 MG PO TABS      TAKE 1 TABLET (1,000 MG TOTAL) BY MOUTH 2 (TWO) TIMES DAILY WITH A MEAL.   60 tablet   4   . PANTOPRAZOLE SODIUM 40 MG PO TBEC      TAKE 1 TABLET BY MOUTH EVERY DAY ON EMPTY STOMACH   30 tablet   12   . THEOPHYLLINE ER 300 MG PO TB12   Oral   Take 1 tablet (300 mg total) by mouth 2 (two) times daily.   180 tablet   3   . TIOTROPIUM BROMIDE MONOHYDRATE 18 MCG IN CAPS   Inhalation   Place 1 capsule (18 mcg total) into inhaler and inhale daily.   30 capsule   12     BP 119/71  Pulse 175  Temp 97.2 F (36.2 C) (Oral)  Resp 18  SpO2 98%  Physical Exam  Nursing note and  vitals reviewed. Constitutional: She appears well-developed and well-nourished. No distress.  HENT:  Head: Normocephalic and atraumatic.  Mouth/Throat: Oropharynx is clear and moist.  Eyes: Conjunctivae normal and EOM are normal. Pupils are equal, round, and reactive to light.  Neck: Normal range of motion. Neck supple.  Cardiovascular: Normal rate, regular rhythm and normal heart sounds.   No murmur heard. Pulmonary/Chest: Effort normal and breath sounds normal.  Abdominal: Soft. Bowel sounds are normal. There is no tenderness.  Musculoskeletal: Normal range of motion. She exhibits no tenderness.  Neurological: She is alert. No cranial nerve deficit. She exhibits normal muscle tone. Coordination normal.  Skin: Skin is warm. No rash noted.       Old bruise to left arm.    ED Course  Procedures (including critical care time)  Labs Reviewed  CBC WITH DIFFERENTIAL - Abnormal; Notable for the following:    Hemoglobin 11.6 (*)     HCT 35.2 (*)     All other components within normal limits  BASIC METABOLIC PANEL - Abnormal; Notable for the following:    Glucose, Bld 170 (*)     All other components within normal limits  GLUCOSE, CAPILLARY - Abnormal; Notable for the following:    Glucose-Capillary 172 (*)     All other components within normal limits  LAB REPORT - SCANNED   No results found.  Date: 07/11/2012  Rate: 84  Rhythm: normal sinus rhythm  QRS Axis: normal  Intervals: normal  ST/T Wave abnormalities: nonspecific T wave changes  Conduction Disutrbances:none  Narrative Interpretation:   Old EKG Reviewed: none available  Results for orders placed during the hospital encounter of 07/11/12  CBC WITH DIFFERENTIAL      Component Value Range   WBC 7.3  4.0 - 10.5 K/uL   RBC 4.06  3.87 - 5.11 MIL/uL   Hemoglobin 11.6 (*) 12.0 - 15.0 g/dL   HCT 40.9 (*) 81.1 - 91.4 %   MCV 86.7  78.0 - 100.0 fL   MCH 28.6  26.0 - 34.0 pg   MCHC 33.0  30.0 -  36.0 g/dL   RDW 16.1  09.6 -  04.5 %   Platelets 270  150 - 400 K/uL   Neutrophils Relative 57  43 - 77 %   Neutro Abs 4.2  1.7 - 7.7 K/uL   Lymphocytes Relative 33  12 - 46 %   Lymphs Abs 2.4  0.7 - 4.0 K/uL   Monocytes Relative 6  3 - 12 %   Monocytes Absolute 0.5  0.1 - 1.0 K/uL   Eosinophils Relative 3  0 - 5 %   Eosinophils Absolute 0.2  0.0 - 0.7 K/uL   Basophils Relative 0  0 - 1 %   Basophils Absolute 0.0  0.0 - 0.1 K/uL  BASIC METABOLIC PANEL      Component Value Range   Sodium 140  135 - 145 mEq/L   Potassium 3.8  3.5 - 5.1 mEq/L   Chloride 99  96 - 112 mEq/L   CO2 29  19 - 32 mEq/L   Glucose, Bld 170 (*) 70 - 99 mg/dL   BUN 8  6 - 23 mg/dL   Creatinine, Ser 4.09  0.50 - 1.10 mg/dL   Calcium 81.1  8.4 - 91.4 mg/dL   GFR calc non Af Amer >90  >90 mL/min   GFR calc Af Amer >90  >90 mL/min  GLUCOSE, CAPILLARY      Component Value Range   Glucose-Capillary 172 (*) 70 - 99 mg/dL   Comment 1 Documented in Chart     Comment 2 Notify RN     Results for orders placed during the hospital encounter of 07/11/12  CBC WITH DIFFERENTIAL      Component Value Range   WBC 7.3  4.0 - 10.5 K/uL   RBC 4.06  3.87 - 5.11 MIL/uL   Hemoglobin 11.6 (*) 12.0 - 15.0 g/dL   HCT 78.2 (*) 95.6 - 21.3 %   MCV 86.7  78.0 - 100.0 fL   MCH 28.6  26.0 - 34.0 pg   MCHC 33.0  30.0 - 36.0 g/dL   RDW 08.6  57.8 - 46.9 %   Platelets 270  150 - 400 K/uL   Neutrophils Relative 57  43 - 77 %   Neutro Abs 4.2  1.7 - 7.7 K/uL   Lymphocytes Relative 33  12 - 46 %   Lymphs Abs 2.4  0.7 - 4.0 K/uL   Monocytes Relative 6  3 - 12 %   Monocytes Absolute 0.5  0.1 - 1.0 K/uL   Eosinophils Relative 3  0 - 5 %   Eosinophils Absolute 0.2  0.0 - 0.7 K/uL   Basophils Relative 0  0 - 1 %   Basophils Absolute 0.0  0.0 - 0.1 K/uL  BASIC METABOLIC PANEL      Component Value Range   Sodium 140  135 - 145 mEq/L   Potassium 3.8  3.5 - 5.1 mEq/L   Chloride 99  96 - 112 mEq/L   CO2 29  19 - 32 mEq/L   Glucose, Bld 170 (*) 70 - 99 mg/dL   BUN 8   6 - 23 mg/dL   Creatinine, Ser 6.29  0.50 - 1.10 mg/dL   Calcium 52.8  8.4 - 41.3 mg/dL   GFR calc non Af Amer >90  >90 mL/min   GFR calc Af Amer >90  >90 mL/min  GLUCOSE, CAPILLARY      Component Value Range   Glucose-Capillary 172 (*) 70 - 99 mg/dL  Comment 1 Documented in Chart     Comment 2 Notify RN     Dg Chest 2 View  07/11/2012  *RADIOLOGY REPORT*  Clinical Data: Fall with possible syncopal episode.  COPD. Coronary artery disease.  CHEST - 2 VIEW  Comparison: 01/13/2012  Findings: Surgical clips projecting over the left chest wall/lateral breast.  Left axillary nodal dissection.  Midline trachea.  Borderline cardiomegaly.     Mediastinal contours otherwise within normal limits.  No pleural effusion or pneumothorax.  Clear lungs.  No congestive failure.  IMPRESSION: Borderline cardiomegaly, without acute disease.   Original Report Authenticated By: Jeronimo Greaves, M.D.    Ct Head Wo Contrast  07/11/2012  *RADIOLOGY REPORT*  Clinical Data: Post fall, hit head, history of dementia  CT HEAD WITHOUT CONTRAST  Technique:  Contiguous axial images were obtained from the base of the skull through the vertex without contrast.  Comparison: 12/16/2011; 12/05/2011; brain MRI - 12/16/2011  Findings:  Grossly unchanged periventricular hypodensity compatible with microvascular ischemic disease, most conspicuous about the occipital horn of the bilateral ventricles, left greater than right (image 20, series 2).  Wallace Cullens white differentiation is otherwise well maintained without CT evidence of acute large territory infarct. Unchanged size and configuration of the ventricles and basilar cisterns.  No midline shift.  Regional soft tissues are normal.  No displaced calvarial fracture. Vascular calcifications within the bilateral carotid siphons.  Limited visualization of the paranasal sinuses suggests scattered opacification of the left ethmoidal air cells.  No air fluid levels.  The bilateral mastoid air cells are  normally aerated.  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   Original Report Authenticated By: Tacey Ruiz, MD       1. Falls       MDM  Hx of multiple falls, work up without specific findings, out patient MRI is most likely indicated. Patient will follow up with [facility]. Both her and spouse do not want admission and will follow up.         Shelda Jakes, MD 07/13/12 [phone]

## 2012-07-11 NOTE — Telephone Encounter (Signed)
Pt has fallen again this AM and hit her head.  She is not sure if she needs to come in.

## 2012-07-13 ENCOUNTER — Encounter: Payer: Self-pay | Admitting: Family Medicine

## 2012-07-13 ENCOUNTER — Ambulatory Visit (INDEPENDENT_AMBULATORY_CARE_PROVIDER_SITE_OTHER): Payer: Medicare Other | Admitting: Family Medicine

## 2012-07-13 VITALS — BP 121/83 | HR 88 | Temp 98.8°F | Wt 150.0 lb

## 2012-07-13 DIAGNOSIS — F0390 Unspecified dementia without behavioral disturbance: Secondary | ICD-10-CM

## 2012-07-13 DIAGNOSIS — F068 Other specified mental disorders due to known physiological condition: Secondary | ICD-10-CM

## 2012-07-13 NOTE — Assessment & Plan Note (Signed)
I completed some additional paperwork for them and gave the FL-2 form to her husband. I'll be happy see her back at any time. I think this is a good move for her

## 2012-07-13 NOTE — Progress Notes (Signed)
  Subjective:    Patient ID: Maria Gaines, female    DOB: 06-04-1951, 61 y.o.   MRN: [id number]  HPI Here with her husband for a checkup. Had a fall recently and was seen at the emergency room. She has some bruises but no other residual injury. Has questions about her medications, specifically if any of them with cause her to have heart fluttering. She notices this mostly at night when she goes to bed. Denies chest pain or shortness of breath. She is preparing to go into assisted living and requires some additional paperwork today. He is doing well   Review of Systems  Constitutional: Negative for fever and fatigue.  HENT: Negative for neck pain.   Respiratory: Negative for shortness of breath.   Cardiovascular: Negative for chest pain.  Gastrointestinal: Negative for abdominal pain.  Psychiatric/Behavioral: Positive for confusion, decreased concentration and agitation. Negative for suicidal ideas and hallucinations.       Objective:   Physical Exam  Constitutional: She appears well-developed and well-nourished.  Eyes: Conjunctivae normal and EOM are normal. Pupils are equal, round, and reactive to light.  Neck: Neck supple. No thyromegaly present.  Cardiovascular: Normal rate, regular rhythm and normal heart sounds.   Pulmonary/Chest: Effort normal and breath sounds normal. She has no wheezes. She has no rales.  Psychiatric: Her behavior is normal.       No word finding difficulty. Some issues with remote memory as well as recent memory. She does not remember which day she was in the emergency room, where there was yesterday or 2 days ago. She's not agitated.          Assessment & Plan:

## 2012-07-14 ENCOUNTER — Other Ambulatory Visit: Payer: Self-pay | Admitting: Family Medicine

## 2012-07-14 NOTE — Progress Notes (Signed)
Patient Active Problem List  Diagnosis  . BREAST CANCER  . DIABETES MELLITUS II, UNCOMPLICATED  . HYPERCHOLESTEROLEMIA  . BIPOLAR DISORDER  . PANIC ATTACKS  . HYPERTENSION, BENIGN SYSTEMIC  . CORONARY, ARTERIOSCLEROSIS  . CHF - EJECTION FRACTION < 50%  . ALLERGIC RHINITIS  . COPD  . GASTROESOPHAGEAL REFLUX, NO ESOPHAGITIS  . OXYGEN-USE OF SUPPLEMENTAL  . Presence of stent in Proximal Right Coronary Artery   . Rapidly progressive dementia    Current outpatient prescriptions:acetaminophen (TYLENOL) 500 MG tablet, Take 1,000 mg by mouth every 6 (six) hours as needed. For pain, Disp: , Rfl: ;  aspirin EC 81 MG tablet, Take 81 mg by mouth daily., Disp: , Rfl: ;  atorvastatin (LIPITOR) 80 MG tablet, Take 1 tablet (80 mg total) by mouth daily., Disp: 30 tablet, Rfl: 12;  carvedilol (COREG) 6.25 MG tablet, Take 6.25 mg by mouth 2 (two) times daily with a meal. , Disp: , Rfl:  clopidogrel (PLAVIX) 75 MG tablet, Take 75 mg by mouth daily.  , Disp: , Rfl: ;  Fluticasone-Salmeterol (ADVAIR) 250-50 MCG/DOSE AEPB, Inhale 1 puff into the lungs every 12 (twelve) hours., Disp: , Rfl: ;  gabapentin (NEURONTIN) 300 MG capsule, TAKE 1 CAPSULE BY MOUTH AT BEDTIME, Disp: 30 capsule, Rfl: 2;  LORazepam (ATIVAN) 1 MG tablet, Take one tab three times a day, Disp: 90 tablet, Rfl: 5 metFORMIN (GLUCOPHAGE) 1000 MG tablet, TAKE 1 TABLET (1,000 MG TOTAL) BY MOUTH 2 (TWO) TIMES DAILY WITH A MEAL., Disp: 60 tablet, Rfl: 4;  pantoprazole (PROTONIX) 40 MG tablet, TAKE 1 TABLET BY MOUTH EVERY DAY ON EMPTY STOMACH, Disp: 30 tablet, Rfl: 12;  theophylline (THEODUR) 300 MG 12 hr tablet, Take 1 tablet (300 mg total) by mouth 2 (two) times daily., Disp: 180 tablet, Rfl: 3 tiotropium (SPIRIVA HANDIHALER) 18 MCG inhalation capsule, Place 1 capsule (18 mcg total) into inhaler and inhale daily., Disp: 30 capsule, Rfl: 12;  [DISCONTINUED] ATROVENT HFA 17 MCG/ACT inhaler, Inhale 1 puff into the lungs 2 (two) times daily. , Disp: , Rfl:  No  current facility-administered medications for this visit. Facility-Administered Medications Ordered in Other Visits: albuterol (PROVENTIL) (5 MG/ML) 0.5% nebulizer solution 2.5 mg, 2.5 mg, Nebulization, Q4H, Doree Albee, MD;  insulin aspart (novoLOG) injection 0-5 Units, 0-5 Units, Subcutaneous, QHS, Doree Albee, MD;  insulin aspart (novoLOG) injection 3 Units, 3 Units, Subcutaneous, TID WC, Doree Albee, MD ipratropium (ATROVENT) nebulizer solution 0.5 mg, 0.5 mg, Nebulization, Q4H, Doree Albee, MD

## 2012-08-17 ENCOUNTER — Telehealth: Payer: Self-pay | Admitting: Clinical

## 2012-08-17 NOTE — Telephone Encounter (Signed)
CSW informed by front office staff that a fax was received with patient signature consenting for medical records to be sent to [facility]. CSW contacted pt spouse who confirmed that the pt is a resident of [facility] [phone] and gave consent for records to be sent to the facility for direct patient care.   Theresia Bough, MSW, Theresia Majors [phone]

## 2012-11-30 ENCOUNTER — Encounter: Payer: Self-pay | Admitting: Internal Medicine

## 2013-03-09 ENCOUNTER — Other Ambulatory Visit: Payer: Self-pay

## 2013-04-03 IMAGING — CR DG CHEST 2V
2 series · 2 of 2 positions shown · non-contrast
Comparison: 12/02/2011

CLINICAL DATA: Altered mental status

CHEST - 2 VIEW

[w chest lat]
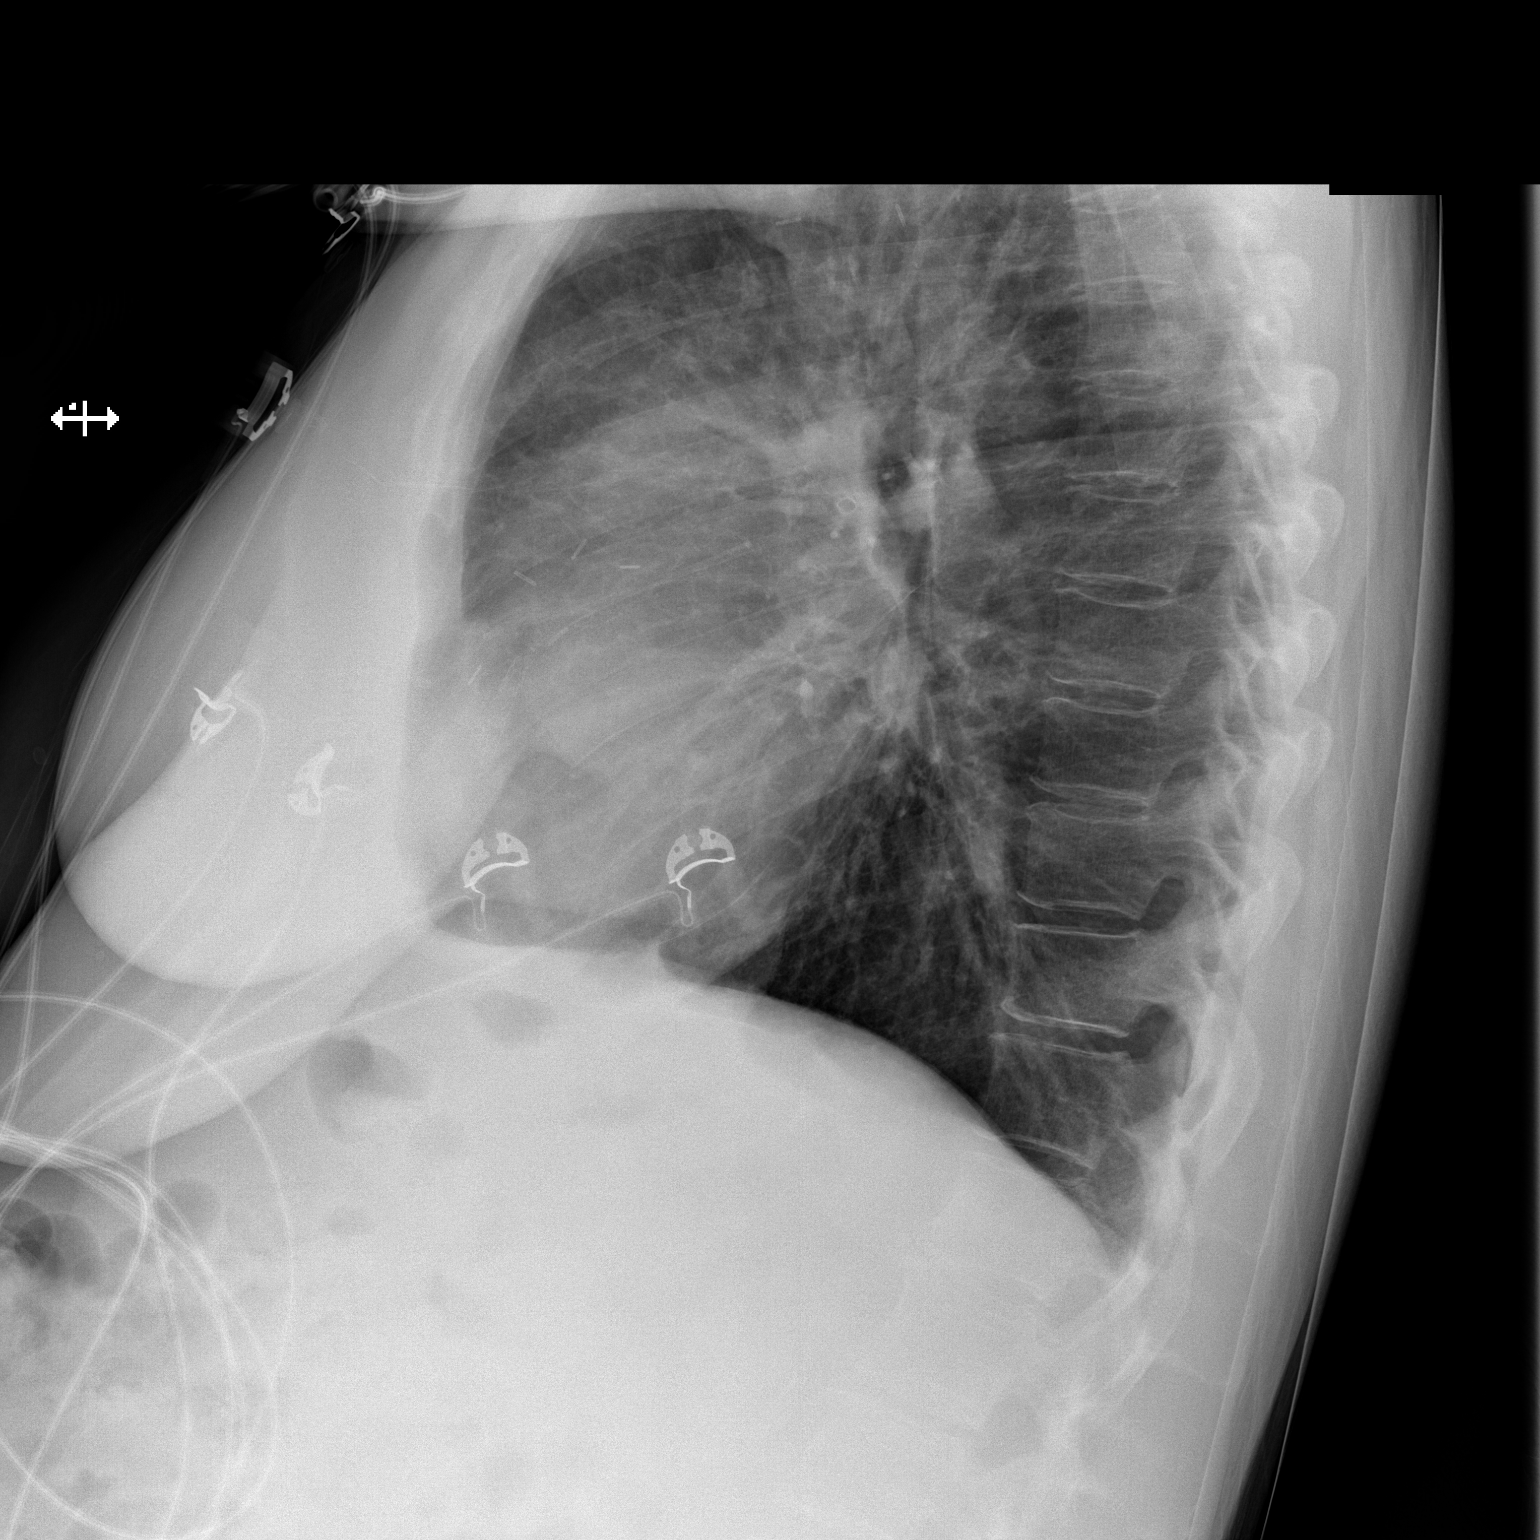

[x chest ap]
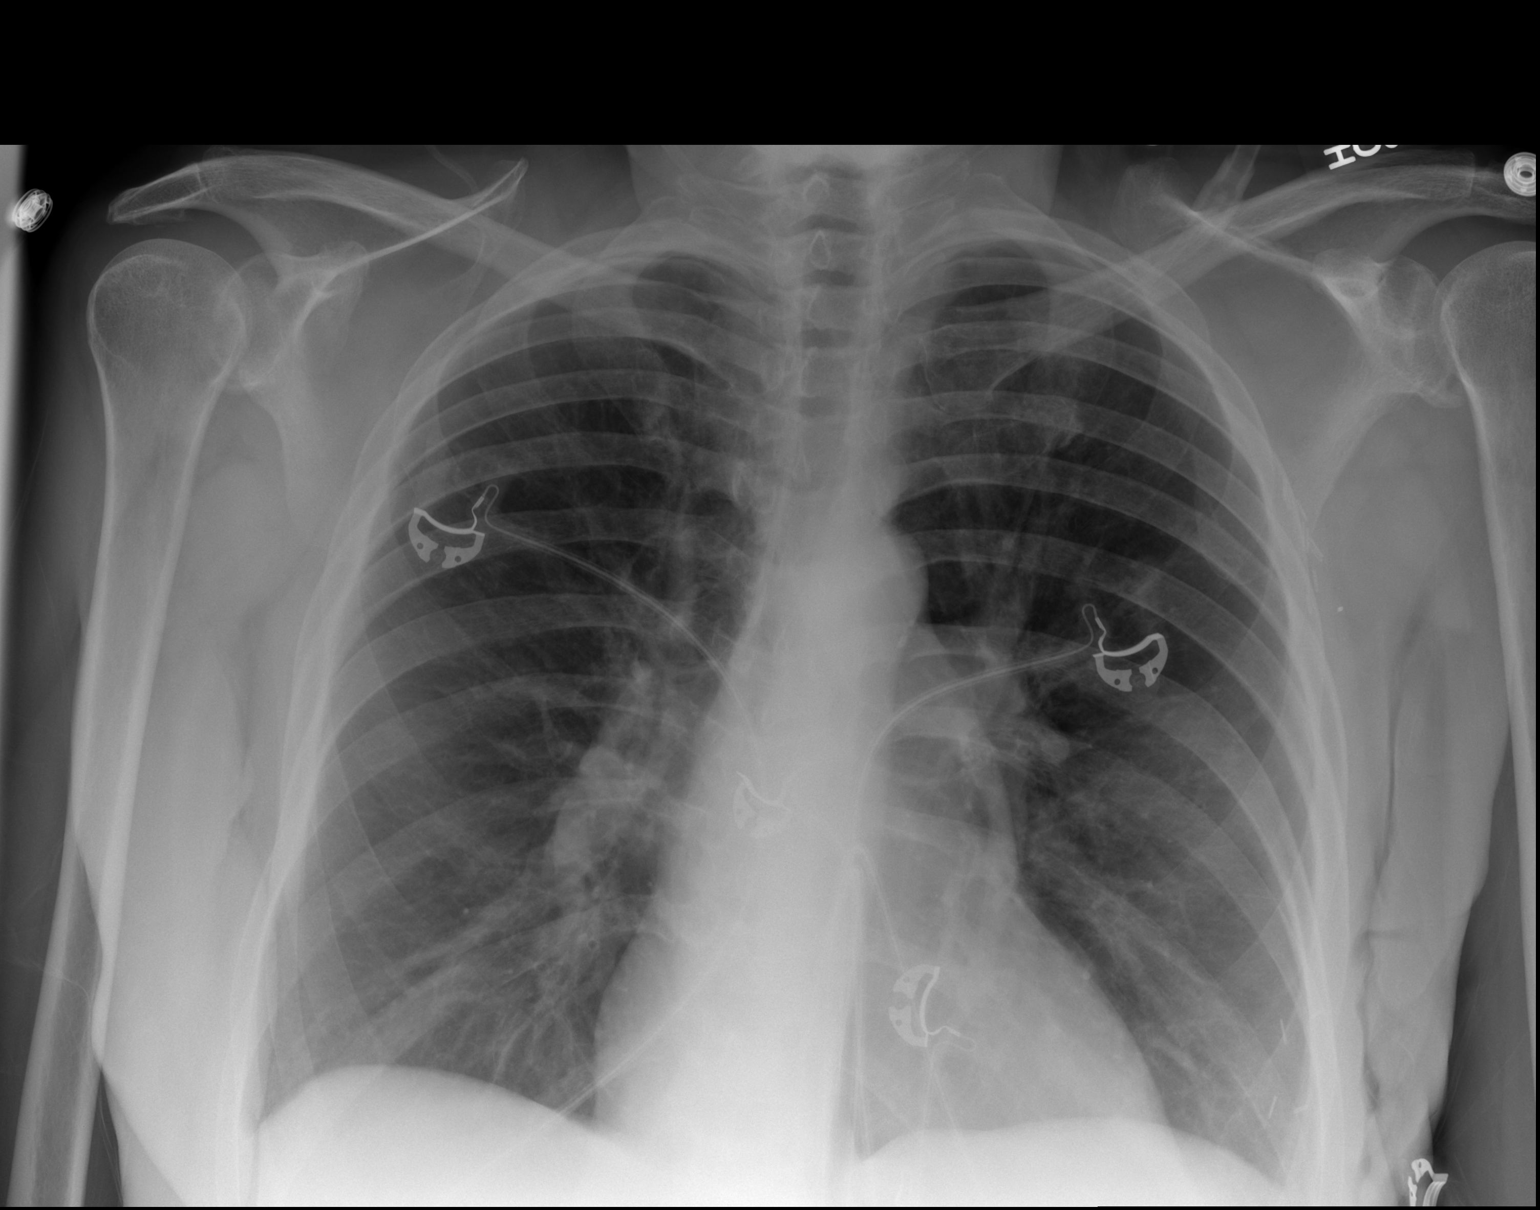

[2 of 2 positions shown; findings below may reference images not displayed]

FINDINGS: Cardiomediastinal silhouette is stable.  Surgical clips
left axilla and left breast are again noted.  No acute infiltrate
or pleural effusion.  No pulmonary edema.  Bony thorax is stable.
IMPRESSION: .  No active disease.  No significant change .

## 2013-04-03 IMAGING — CT CT HEAD W/O CM
2 series · 16 of 30 positions shown, 20 images · non-contrast
Comparison: 11/12/2011

CLINICAL DATA: Altered mental status

CT HEAD WITHOUT CONTRAST
TECHNIQUE: Contiguous axial images were obtained from the base of
the skull through the vertex without contrast.

[Series 2: head w/o · axial · non-contrast · 0.41mm/px · z∈[-104,+31]mm · 13 of 33 slices shown, 17 images]
[im 3/33  brain]
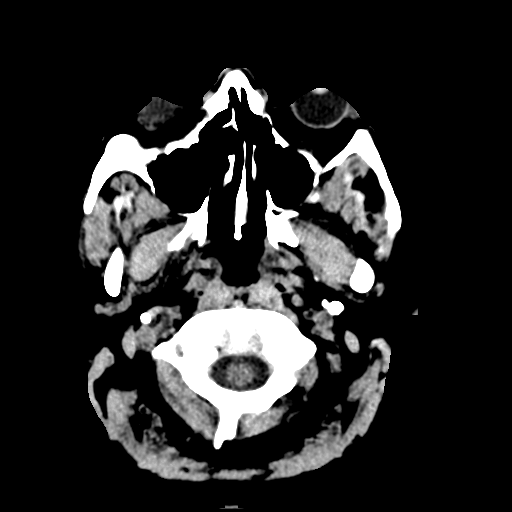
[im 3/33  bone]
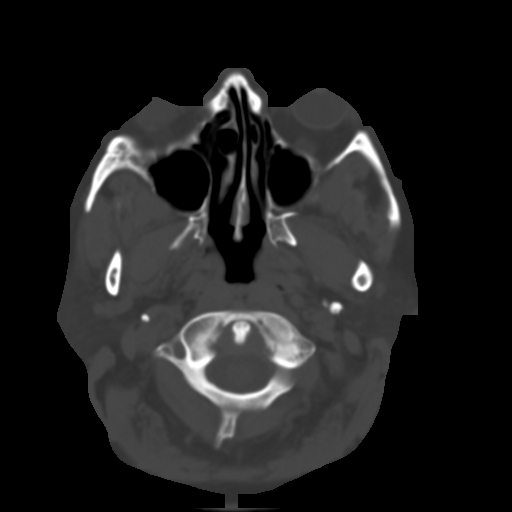
[im 5/33  brain]
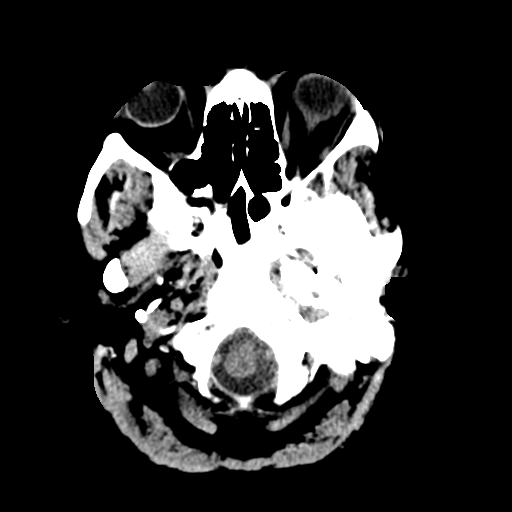
[im 7/33  brain]
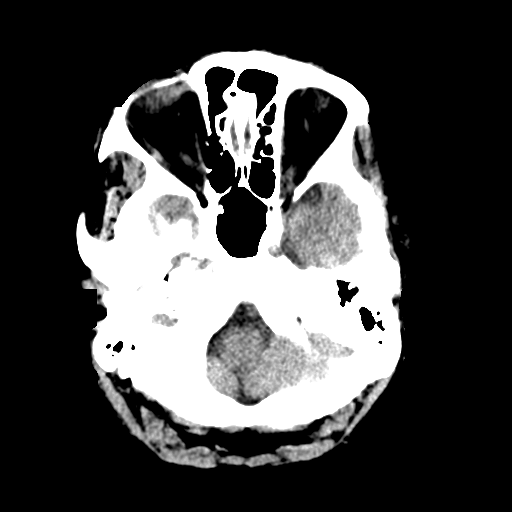
[im 10/33  brain]
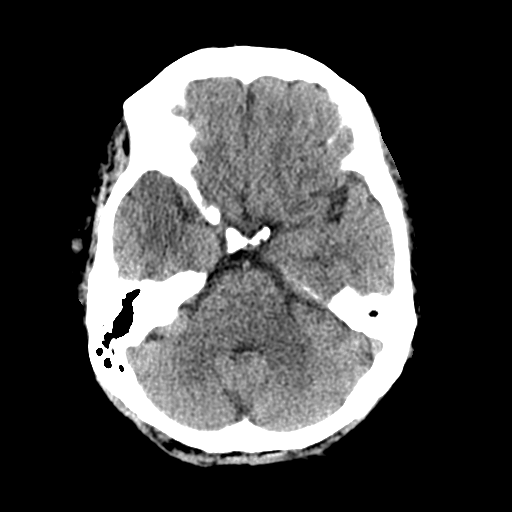
[im 12/33  brain]
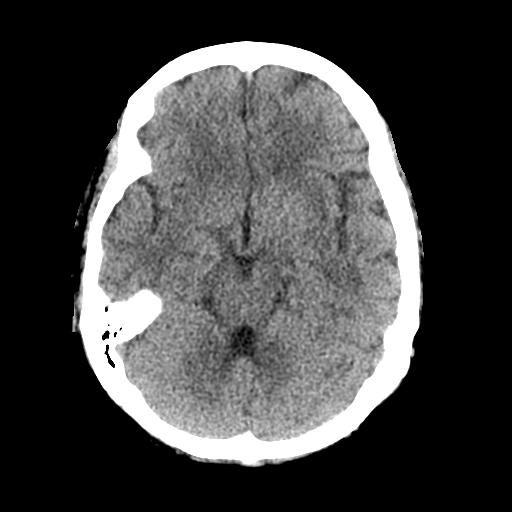
[im 12/33  bone]
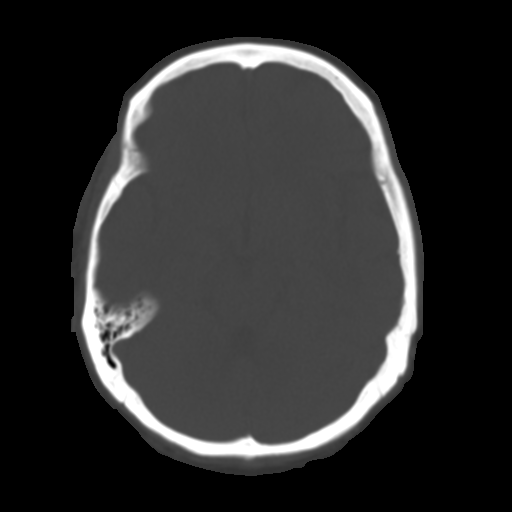
[im 14/33  brain]
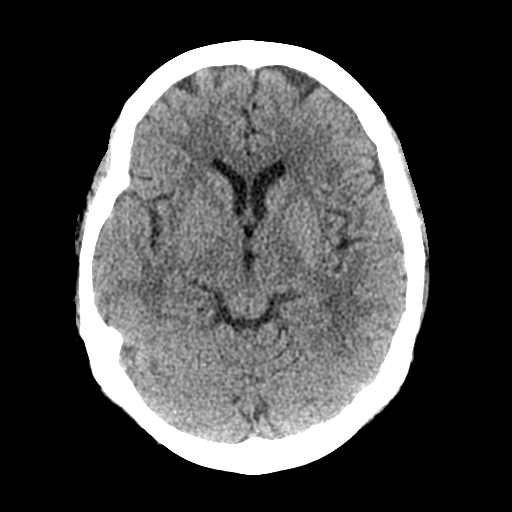
[im 17/33  brain]
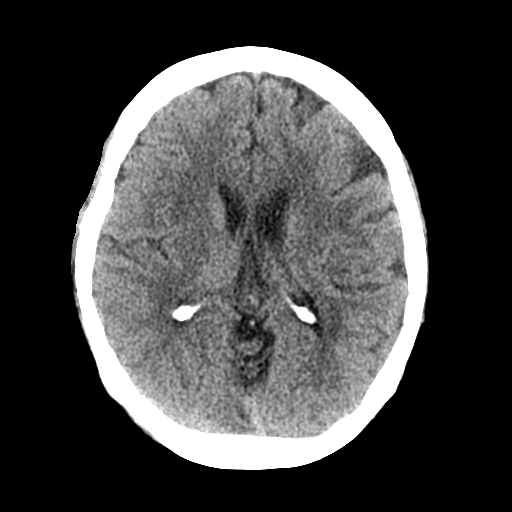
[im 19/33  brain]
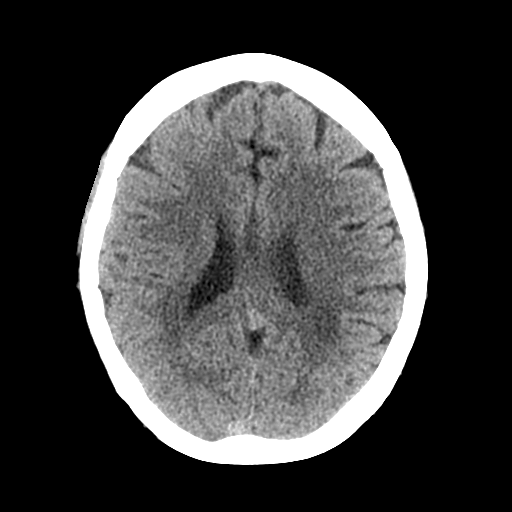
[im 21/33  brain]
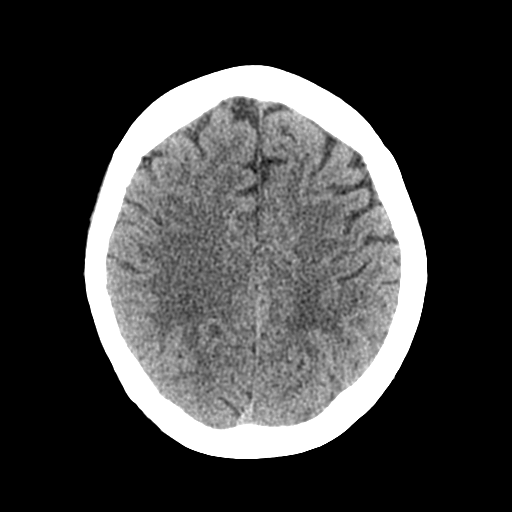
[im 21/33  bone]
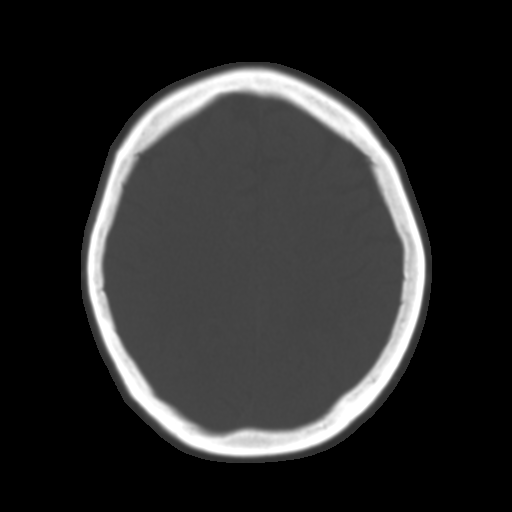
[im 23/33  brain]
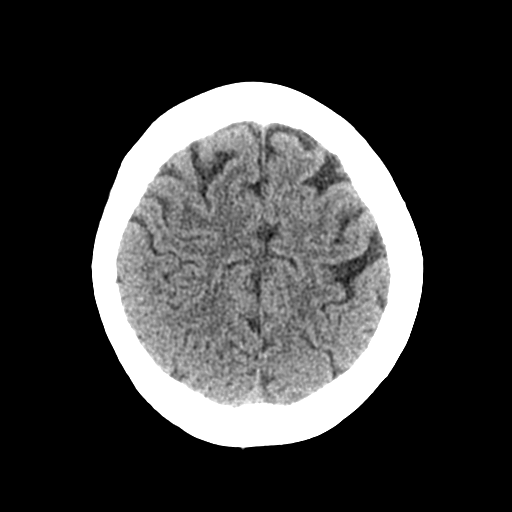
[im 26/33  brain]
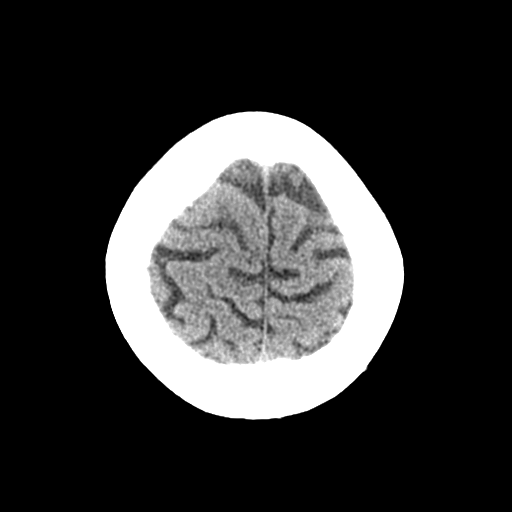
[im 28/33  brain]
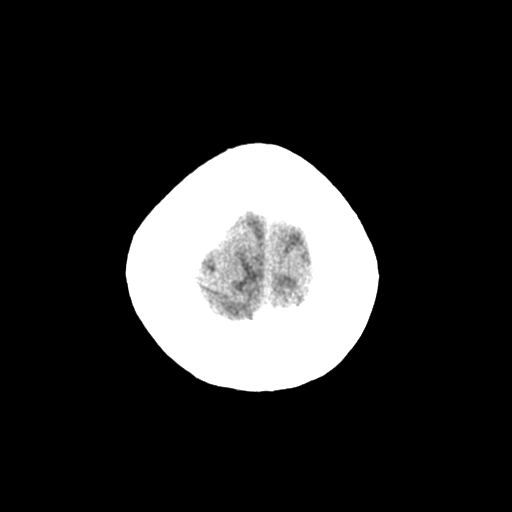
[im 30/33  brain]
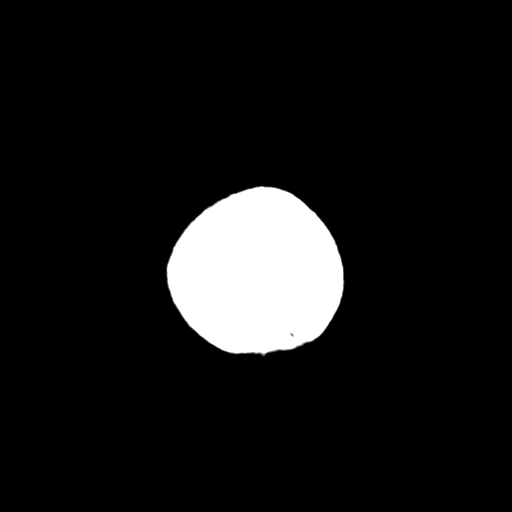
[im 30/33  bone]
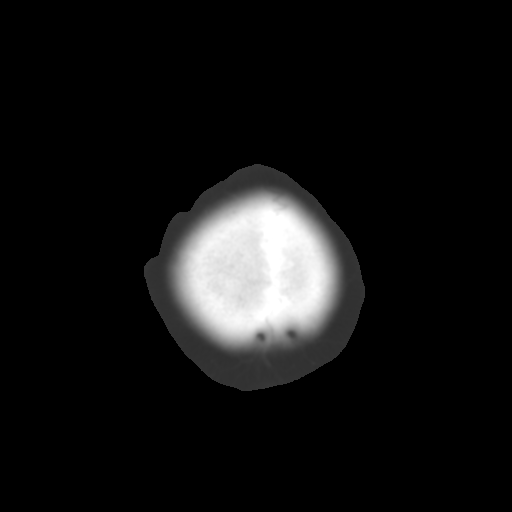

[Series 3: bone windows · axial · 0.41mm/px · z∈[-104,-59]mm · 3 of 33 slices shown]
[im 3/33  bone]
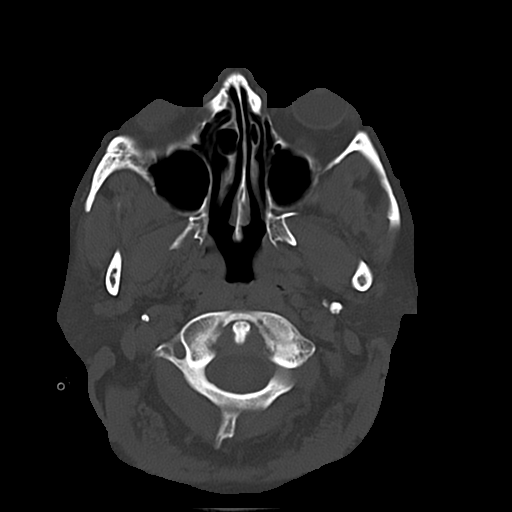
[im 7/33  bone]
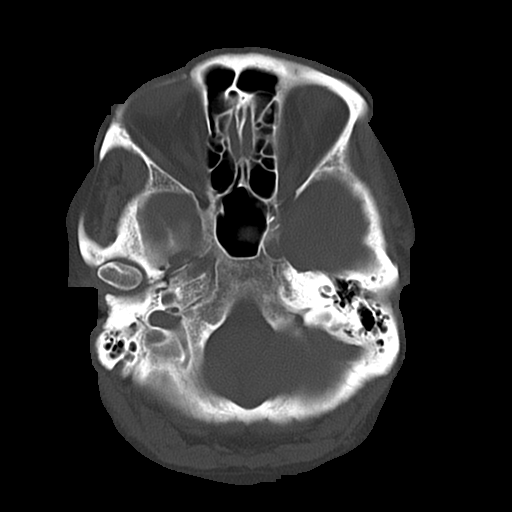
[im 12/33  bone]
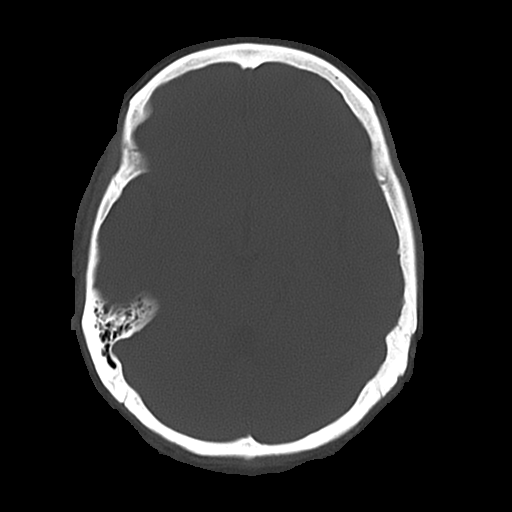

[16 of 30 positions shown; findings below may reference images not displayed]

FINDINGS: There is diffuse patchy low density throughout the
subcortical and periventricular white matter consistent with
chronic small vessel ischemic change.

The brain has an otherwise normal appearance without evidence for
hemorrhage, infarction, hydrocephalus, or mass lesion.  There is no
extra axial fluid collection.  The skull and paranasal sinuses are
normal.
IMPRESSION: 1.  No acute intracranial abnormalities.
2.  Small vessel ischemic change as before.

## 2014-11-16 ENCOUNTER — Telehealth: Payer: Self-pay | Admitting: *Deleted

## 2014-11-16 NOTE — Telephone Encounter (Signed)
LMOVM for pt to call us back. Zalan Shidler, CMA  

## 2017-10-29 DEATH — deceased
